# Patient Record
Sex: Male | Born: 1955 | Race: White | Hispanic: No | Marital: Married | Smoking: Never smoker
Health system: Southern US, Community
[De-identification: ages and names within clinical notes are randomized; demographics above are authoritative.]

## PROBLEM LIST (undated history)

## (undated) DIAGNOSIS — E876 Hypokalemia: Secondary | ICD-10-CM

## (undated) DIAGNOSIS — M2041 Other hammer toe(s) (acquired), right foot: Secondary | ICD-10-CM

## (undated) DIAGNOSIS — M869 Osteomyelitis, unspecified: Secondary | ICD-10-CM

## (undated) DIAGNOSIS — E785 Hyperlipidemia, unspecified: Secondary | ICD-10-CM

## (undated) DIAGNOSIS — Z89432 Acquired absence of left foot: Secondary | ICD-10-CM

## (undated) DIAGNOSIS — Q249 Congenital malformation of heart, unspecified: Secondary | ICD-10-CM

## (undated) DIAGNOSIS — E039 Hypothyroidism, unspecified: Secondary | ICD-10-CM

## (undated) DIAGNOSIS — H269 Unspecified cataract: Secondary | ICD-10-CM

## (undated) DIAGNOSIS — E119 Type 2 diabetes mellitus without complications: Secondary | ICD-10-CM

## (undated) DIAGNOSIS — R7881 Bacteremia: Secondary | ICD-10-CM

## (undated) DIAGNOSIS — Z872 Personal history of diseases of the skin and subcutaneous tissue: Secondary | ICD-10-CM

## (undated) DIAGNOSIS — I10 Essential (primary) hypertension: Secondary | ICD-10-CM

## (undated) DIAGNOSIS — M14671 Charcot's joint, right ankle and foot: Secondary | ICD-10-CM

## (undated) DIAGNOSIS — K219 Gastro-esophageal reflux disease without esophagitis: Secondary | ICD-10-CM

## (undated) HISTORY — PX: HERNIA REPAIR: SHX51

---

## 2021-01-15 ENCOUNTER — Encounter: Payer: Self-pay | Admitting: *Deleted

## 2021-01-15 ENCOUNTER — Other Ambulatory Visit: Payer: Self-pay

## 2021-01-15 ENCOUNTER — Emergency Department: Payer: PRIVATE HEALTH INSURANCE

## 2021-01-15 DIAGNOSIS — M869 Osteomyelitis, unspecified: Secondary | ICD-10-CM | POA: Diagnosis not present

## 2021-01-15 DIAGNOSIS — E039 Hypothyroidism, unspecified: Secondary | ICD-10-CM | POA: Diagnosis present

## 2021-01-15 DIAGNOSIS — E11621 Type 2 diabetes mellitus with foot ulcer: Secondary | ICD-10-CM | POA: Diagnosis present

## 2021-01-15 DIAGNOSIS — Z833 Family history of diabetes mellitus: Secondary | ICD-10-CM

## 2021-01-15 DIAGNOSIS — L97509 Non-pressure chronic ulcer of other part of unspecified foot with unspecified severity: Secondary | ICD-10-CM | POA: Diagnosis present

## 2021-01-15 DIAGNOSIS — E1142 Type 2 diabetes mellitus with diabetic polyneuropathy: Secondary | ICD-10-CM | POA: Diagnosis present

## 2021-01-15 DIAGNOSIS — I10 Essential (primary) hypertension: Secondary | ICD-10-CM | POA: Diagnosis present

## 2021-01-15 DIAGNOSIS — M009 Pyogenic arthritis, unspecified: Secondary | ICD-10-CM | POA: Diagnosis present

## 2021-01-15 DIAGNOSIS — E785 Hyperlipidemia, unspecified: Secondary | ICD-10-CM | POA: Diagnosis present

## 2021-01-15 DIAGNOSIS — E1169 Type 2 diabetes mellitus with other specified complication: Secondary | ICD-10-CM | POA: Diagnosis not present

## 2021-01-15 DIAGNOSIS — Z794 Long term (current) use of insulin: Secondary | ICD-10-CM

## 2021-01-15 DIAGNOSIS — Z20822 Contact with and (suspected) exposure to covid-19: Secondary | ICD-10-CM | POA: Diagnosis present

## 2021-01-15 LAB — BASIC METABOLIC PANEL
Anion gap: 9 (ref 5–15)
BUN: 14 mg/dL (ref 8–23)
CO2: 25 mmol/L (ref 22–32)
Calcium: 9.1 mg/dL (ref 8.9–10.3)
Chloride: 105 mmol/L (ref 98–111)
Creatinine, Ser: 0.86 mg/dL (ref 0.61–1.24)
GFR, Estimated: >60 mL/min (ref >60)
Glucose, Bld: 161 mg/dL — ABNORMAL HIGH (ref 70–99)
Potassium: 3.9 mmol/L (ref 3.5–5.1)
Sodium: 139 mmol/L (ref 135–145)

## 2021-01-15 LAB — CBC
HCT: 42.8 % (ref 39.0–52.0)
Hemoglobin: 14.7 g/dL (ref 13.0–17.0)
MCH: 30.2 pg (ref 26.0–34.0)
MCHC: 34.3 g/dL (ref 30.0–36.0)
MCV: 88.1 fL (ref 80.0–100.0)
Platelets: 241 10*3/uL (ref 150–400)
RBC: 4.86 MIL/uL (ref 4.22–5.81)
RDW: 13.1 % (ref 11.5–15.5)
WBC: 13.4 10*3/uL — ABNORMAL HIGH (ref 4.0–10.5)
nRBC: 0 % (ref 0.0–0.2)

## 2021-01-15 NOTE — ED Triage Notes (Signed)
Pt has a foot ulcer to left great toe and left 3rd toe.  Pt sent from his pmd for eval.  Pt ambulates without diff.  Denies pain.  Hx diabetes  pt alert  speech clear.

## 2021-01-16 ENCOUNTER — Inpatient Hospital Stay: Payer: PRIVATE HEALTH INSURANCE

## 2021-01-16 ENCOUNTER — Inpatient Hospital Stay
Admission: EM | Admit: 2021-01-16 | Discharge: 2021-01-17 | DRG: 638 | Disposition: A | Discharge status: Home / Self Care | Payer: PRIVATE HEALTH INSURANCE | Attending: Internal Medicine | Admitting: Internal Medicine

## 2021-01-16 ENCOUNTER — Encounter: Payer: Self-pay | Admitting: Emergency Medicine

## 2021-01-16 DIAGNOSIS — Z20822 Contact with and (suspected) exposure to covid-19: Secondary | ICD-10-CM | POA: Diagnosis present

## 2021-01-16 DIAGNOSIS — Z833 Family history of diabetes mellitus: Secondary | ICD-10-CM | POA: Diagnosis not present

## 2021-01-16 DIAGNOSIS — E1169 Type 2 diabetes mellitus with other specified complication: Secondary | ICD-10-CM | POA: Diagnosis present

## 2021-01-16 DIAGNOSIS — M869 Osteomyelitis, unspecified: Secondary | ICD-10-CM | POA: Diagnosis present

## 2021-01-16 DIAGNOSIS — I10 Essential (primary) hypertension: Secondary | ICD-10-CM

## 2021-01-16 DIAGNOSIS — E039 Hypothyroidism, unspecified: Secondary | ICD-10-CM | POA: Diagnosis present

## 2021-01-16 DIAGNOSIS — L97509 Non-pressure chronic ulcer of other part of unspecified foot with unspecified severity: Secondary | ICD-10-CM | POA: Diagnosis present

## 2021-01-16 DIAGNOSIS — M86672 Other chronic osteomyelitis, left ankle and foot: Secondary | ICD-10-CM | POA: Diagnosis not present

## 2021-01-16 DIAGNOSIS — E1142 Type 2 diabetes mellitus with diabetic polyneuropathy: Secondary | ICD-10-CM | POA: Diagnosis present

## 2021-01-16 DIAGNOSIS — E11621 Type 2 diabetes mellitus with foot ulcer: Secondary | ICD-10-CM | POA: Diagnosis present

## 2021-01-16 DIAGNOSIS — Z794 Long term (current) use of insulin: Secondary | ICD-10-CM | POA: Diagnosis not present

## 2021-01-16 DIAGNOSIS — M009 Pyogenic arthritis, unspecified: Secondary | ICD-10-CM

## 2021-01-16 DIAGNOSIS — E785 Hyperlipidemia, unspecified: Secondary | ICD-10-CM | POA: Diagnosis present

## 2021-01-16 HISTORY — DX: Unspecified cataract: H26.9

## 2021-01-16 HISTORY — DX: Type 2 diabetes mellitus without complications: E11.9

## 2021-01-16 HISTORY — DX: Essential (primary) hypertension: I10

## 2021-01-16 HISTORY — DX: Congenital malformation of heart, unspecified: Q24.9

## 2021-01-16 LAB — BASIC METABOLIC PANEL
Anion gap: 5 (ref 5–15)
BUN: 14 mg/dL (ref 8–23)
CO2: 28 mmol/L (ref 22–32)
Calcium: 8.7 mg/dL — ABNORMAL LOW (ref 8.9–10.3)
Chloride: 107 mmol/L (ref 98–111)
Creatinine, Ser: 0.82 mg/dL (ref 0.61–1.24)
GFR, Estimated: >60 mL/min (ref >60)
Glucose, Bld: 124 mg/dL — ABNORMAL HIGH (ref 70–99)
Potassium: 3.5 mmol/L (ref 3.5–5.1)
Sodium: 140 mmol/L (ref 135–145)

## 2021-01-16 LAB — RESP PANEL BY RT-PCR (FLU A&B, COVID) ARPGX2
Influenza A by PCR: NEGATIVE
Influenza B by PCR: NEGATIVE
SARS Coronavirus 2 by RT PCR: NEGATIVE

## 2021-01-16 LAB — CBC
HCT: 41.6 % (ref 39.0–52.0)
Hemoglobin: 14.1 g/dL (ref 13.0–17.0)
MCH: 30.3 pg (ref 26.0–34.0)
MCHC: 33.9 g/dL (ref 30.0–36.0)
MCV: 89.3 fL (ref 80.0–100.0)
Platelets: 206 10*3/uL (ref 150–400)
RBC: 4.66 MIL/uL (ref 4.22–5.81)
RDW: 13.2 % (ref 11.5–15.5)
WBC: 11 10*3/uL — ABNORMAL HIGH (ref 4.0–10.5)
nRBC: 0 % (ref 0.0–0.2)

## 2021-01-16 LAB — CBG MONITORING, ED
Glucose-Capillary: 116 mg/dL — ABNORMAL HIGH (ref 70–99)
Glucose-Capillary: 110 mg/dL — ABNORMAL HIGH (ref 70–99)
Glucose-Capillary: 197 mg/dL — ABNORMAL HIGH (ref 70–99)
Glucose-Capillary: 271 mg/dL — ABNORMAL HIGH (ref 70–99)

## 2021-01-16 LAB — LACTIC ACID, PLASMA
Lactic Acid, Venous: 1.7 mmol/L (ref 0.5–1.9)
Lactic Acid, Venous: 1.4 mmol/L (ref 0.5–1.9)

## 2021-01-16 LAB — TYPE AND SCREEN
ABO/RH(D): A NEG
Antibody Screen: NEGATIVE

## 2021-01-16 LAB — PROTIME-INR
INR: 1 (ref 0.8–1.2)
Prothrombin Time: 13 seconds (ref 11.4–15.2)

## 2021-01-16 LAB — SEDIMENTATION RATE: Sed Rate: 9 mm/hr (ref 0–20)

## 2021-01-16 LAB — HEMOGLOBIN A1C
Hgb A1c MFr Bld: 8.6 % — ABNORMAL HIGH (ref 4.8–5.6)
Mean Plasma Glucose: 200 mg/dL

## 2021-01-16 LAB — C-REACTIVE PROTEIN: CRP: 0.7 mg/dL (ref <1.0)

## 2021-01-16 LAB — GLUCOSE, CAPILLARY: Glucose-Capillary: 197 mg/dL — ABNORMAL HIGH (ref 70–99)

## 2021-01-16 LAB — HIV ANTIBODY (ROUTINE TESTING W REFLEX): HIV Screen 4th Generation wRfx: NONREACTIVE

## 2021-01-16 MED ORDER — VANCOMYCIN HCL 1500 MG/300ML IV SOLN: 1500.0000 mg | Freq: Two times a day (BID) | INTRAVENOUS | Status: DC

## 2021-01-16 MED ORDER — ACETAMINOPHEN 650 MG RE SUPP: 650.0000 mg | Freq: Four times a day (QID) | RECTAL | Status: DC | PRN

## 2021-01-16 MED ORDER — PIPERACILLIN-TAZOBACTAM 3.375 G IVPB 30 MIN
3.3750 g | Freq: Once | INTRAVENOUS | Status: AC
  Administered 2021-01-16: 3.375 g via INTRAVENOUS
  Filled 2021-01-16: qty 50

## 2021-01-16 MED ORDER — VANCOMYCIN HCL 2000 MG/400ML IV SOLN
2000.0000 mg | Freq: Once | INTRAVENOUS | Status: AC
  Administered 2021-01-16: 2000 mg via INTRAVENOUS
  Filled 2021-01-16: qty 400

## 2021-01-16 MED ORDER — VANCOMYCIN HCL IN DEXTROSE 1-5 GM/200ML-% IV SOLN
1000.0000 mg | Freq: Once | INTRAVENOUS | Status: DC
  Filled 2021-01-16: qty 200

## 2021-01-16 MED ORDER — HYDRALAZINE HCL 20 MG/ML IJ SOLN
10.0000 mg | Freq: Four times a day (QID) | INTRAMUSCULAR | Status: DC | PRN
  Administered 2021-01-16: 10 mg via INTRAVENOUS
  Filled 2021-01-16: qty 1

## 2021-01-16 MED ORDER — SODIUM CHLORIDE 0.9 % IV SOLN
2.0000 g | Freq: Three times a day (TID) | INTRAVENOUS | Status: DC
  Administered 2021-01-16 – 2021-01-17 (×4): 2 g via INTRAVENOUS
  Filled 2021-01-16 (×8): qty 2

## 2021-01-16 MED ORDER — MORPHINE SULFATE (PF) 2 MG/ML IV SOLN: 2.0000 mg | INTRAVENOUS | Status: DC | PRN

## 2021-01-16 MED ORDER — SODIUM CHLORIDE 0.9 % IV SOLN: 1.0000 g | Freq: Once | INTRAVENOUS | Status: DC

## 2021-01-16 MED ORDER — TRAZODONE HCL 50 MG PO TABS: 25.0000 mg | ORAL_TABLET | Freq: Every evening | ORAL | Status: DC | PRN

## 2021-01-16 MED ORDER — SODIUM CHLORIDE 0.9 % IV SOLN
1.0000 g | Freq: Three times a day (TID) | INTRAVENOUS | Status: DC
  Administered 2021-01-16: 1 g via INTRAVENOUS
  Filled 2021-01-16: qty 1

## 2021-01-16 MED ORDER — ACETAMINOPHEN 325 MG PO TABS
650.0000 mg | ORAL_TABLET | Freq: Four times a day (QID) | ORAL | Status: DC | PRN
  Administered 2021-01-17: 650 mg via ORAL
  Filled 2021-01-16: qty 2

## 2021-01-16 MED ORDER — ENOXAPARIN SODIUM 40 MG/0.4ML IJ SOSY
40.0000 mg | PREFILLED_SYRINGE | INTRAMUSCULAR | Status: DC
  Administered 2021-01-16 – 2021-01-17 (×2): 40 mg via SUBCUTANEOUS
  Filled 2021-01-16 (×2): qty 0.4

## 2021-01-16 MED ORDER — ONDANSETRON HCL 4 MG/2ML IJ SOLN: 4.0000 mg | Freq: Four times a day (QID) | INTRAMUSCULAR | Status: DC | PRN

## 2021-01-16 MED ORDER — ONDANSETRON HCL 4 MG PO TABS: 4.0000 mg | ORAL_TABLET | Freq: Four times a day (QID) | ORAL | Status: DC | PRN

## 2021-01-16 MED ORDER — VANCOMYCIN HCL 1250 MG/250ML IV SOLN
1250.0000 mg | Freq: Two times a day (BID) | INTRAVENOUS | Status: DC
  Administered 2021-01-16 – 2021-01-17 (×2): 1250 mg via INTRAVENOUS
  Filled 2021-01-16 (×4): qty 250

## 2021-01-16 MED ORDER — INSULIN ASPART 100 UNIT/ML IJ SOLN: 0.0000 [IU] | Freq: Every day | INTRAMUSCULAR | Status: DC

## 2021-01-16 MED ORDER — VANCOMYCIN HCL IN DEXTROSE 1-5 GM/200ML-% IV SOLN: 1000.0000 mg | Freq: Once | INTRAVENOUS | Status: DC

## 2021-01-16 MED ORDER — INSULIN ASPART 100 UNIT/ML IJ SOLN
0.0000 [IU] | Freq: Three times a day (TID) | INTRAMUSCULAR | Status: DC
Start: 2021-01-16 — End: 2021-01-17
  Administered 2021-01-16: 3 [IU] via SUBCUTANEOUS
  Administered 2021-01-16: 8 [IU] via SUBCUTANEOUS
  Administered 2021-01-17: 3 [IU] via SUBCUTANEOUS
  Administered 2021-01-17: 2 [IU] via SUBCUTANEOUS
  Filled 2021-01-16 (×4): qty 1

## 2021-01-16 MED ORDER — SODIUM CHLORIDE 0.9 % IV SOLN
INTRAVENOUS | Status: DC
  Administered 2021-01-16: 100 mL/h via INTRAVENOUS

## 2021-01-16 NOTE — ED Notes (Signed)
Pt eating lunch in hall. No further needs expressed.

## 2021-01-16 NOTE — Progress Notes (Addendum)
Pharmacy Antibiotic Note  Alexander Gill is a 65 y.o. male admitted on 01/16/2021 with cellulitis w/ foot ulcers.  Imagine concerning for septic arthritis and/or osteomyelitis.    Pharmacy has been consulted for Vancomycin and Cefepime dosing.  Plan: Will stop meropenem and start:  Cefepime 2g q8h  Vancomcyin Pt given 2 gm dose in ED.  Vancomycin 1250 mg IV Q 12 hrs.  Goal AUC 400-550. Expected AUC: 464.3 SCr used: 0.86  Pharmacy will continue to follow and adjust abx dosing whenever warranted.  Height: 6' (182.9 cm) Weight: 88.5 kg (195 lb) IBW/kg (Calculated) : 77.6  Temp (24hrs), Avg:98.4 F (36.9 C), Min:98.4 F (36.9 C), Max:98.4 F (36.9 C)  Recent Labs  Lab 01/15/21 1929 01/16/21 0120 01/16/21 0139 01/16/21 0234  WBC 13.4*  --  11.0*  --   CREATININE 0.86  --  0.82  --   LATICACIDVEN  --  1.7  --  1.4     Estimated Creatinine Clearance: 101.2 mL/min (by C-G formula based on SCr of 0.82 mg/dL).    No Known Allergies  Antimicrobials this admission: 06/30 Zosyn >> x 1 06/30 Vancomycin >>  06/30 Meropenem x1 06/30 Cefepime >>  Microbiology results: 06/30 BCx: Pending  Thank you for allowing pharmacy to be a part of this patient's care.  Albina Billet, PharmD, BCPS Clinical Pharmacist 01/16/2021 8:23 AM

## 2021-01-16 NOTE — H&P (Signed)
[place]   PATIENT NAME: Alexander Gill    MR#:  [id number]  DATE OF BIRTH:  01-22-1956  DATE OF ADMISSION:  01/16/2021  PRIMARY CARE PHYSICIAN: Pcp, No   Patient is coming from: Home  REQUESTING/REFERRING PHYSICIAN: Ward, Layla Maw, DO  CHIEF COMPLAINT:   Chief Complaint  Patient presents with  . Foot Ulcer    HISTORY OF PRESENT ILLNESS:  Alexander Gill is a 65 y.o. Caucasian male with medical history significant for type 2 diabetes mellitus, who presented to the emergency room with acute onset of left big toe and left third toe wounds that initially started with a blister 3 months ago on the left great toe and has been progressively worsening.  His left third toe wound.  About a month ago.  Has peripheral neuropathy with diabetes mellitus.  He denies any fever or chills.  He has been swelling and breath without pain secondary to his peripheral neuropathy.  No chest pain or dyspnea or cough or wheezing.  No nausea or vomiting or abdominal pain.  He denied any headache or dizziness or blurred vision.  No dysuria, oliguria or hematuria or flank pain.  He saw his primary care physician last Wednesday and was given a prescription for Augmentin and Bactrim.  He was given an appointment for x-ray yesterday morning.  After receiving results of the x-ray is primary care physician advised him to come to the ER.  ED Course: Upon presenting to the emergency room, blood pressure was 173/124 with a heart rate of 58 with otherwise normal vital signs.  Labs revealed Blood glucose of 161 with otherwise unremarkable BMP lactic acid was 1.7 and CBC showed leukocytosis of 13.4.  Lactic acid was 1.4 later.  2 blood cultures were drawn.  Influenza antigens and COVID-19 PCR came back negative.  Imaging: Left foot x-ray showed the following: Soft tissue ulceration the medial first digit at the level of the interphalangeal joint. Destructive changes and subcortical osteopenia of the proximal middle  distal phalanges with subluxation across the joint concerning for osteomyelitis and septic arthritis.   Additional ulceration involving the tip of the third digit with questionable focus of soft tissue gas. Subjacent transcortical lucency and osteopenia the third distal phalanx concerning for additional site of osteomyelitis.   Further characterization could be made with MR imaging as clinically warranted.  The patient was given IV vancomycin and Zosyn.  He will be admitted to a medical bed for further evaluation and management. PAST MEDICAL HISTORY:   Past Medical History:  Diagnosis Date  . Diabetes mellitus without complication (HCC)     PAST SURGICAL HISTORY:  History reviewed. No pertinent surgical history.  He denies any previous surgeries.  SOCIAL HISTORY:   Social History   Tobacco Use  . Smoking status: Never  . Smokeless tobacco: Not on file  Substance Use Topics  . Alcohol use: Not Currently    FAMILY HISTORY:   Positive for diabetes mellitus in his father.  DRUG ALLERGIES:  No Known Allergies  REVIEW OF SYSTEMS:   ROS As per history of present illness. All pertinent systems were reviewed above. Constitutional, HEENT, cardiovascular, respiratory, GI, GU, musculoskeletal, neuro, psychiatric, endocrine, integumentary and hematologic systems were reviewed and are otherwise negative/unremarkable except for positive findings mentioned above in the HPI.   MEDICATIONS AT HOME:   Prior to Admission medications   Not on File      VITAL SIGNS:  Blood pressure (!) 198/85, pulse (!) 53,  temperature 98.4 F (36.9 C), temperature source Oral, resp. rate 18, height 6' (1.829 m), weight 88.5 kg, SpO2 99 %.  PHYSICAL EXAMINATION:  Physical Exam  GENERAL:  65 y.o.-year-old Caucasian male patient lying in the bed with no acute distress.  EYES: Pupils equal, round, reactive to light and accommodation. No scleral icterus. Extraocular muscles intact.  HEENT: Head  atraumatic, normocephalic. Oropharynx and nasopharynx clear.  NECK:  Supple, no jugular venous distention. No thyroid enlargement, no tenderness.  LUNGS: Normal breath sounds bilaterally, no wheezing, rales,rhonchi or crepitation. No use of accessory muscles of respiration.  CARDIOVASCULAR: Regular rate and rhythm, S1, S2 normal. No murmurs, rubs, or gallops.  ABDOMEN: Soft, nondistended, nontender. Bowel sounds present. No organomegaly or mass.  EXTREMITIES: No pedal edema, cyanosis, or clubbing.  NEUROLOGIC: Cranial nerves II through XII are intact. Muscle strength 5/5 in all extremities. Sensation intact. Gait not checked.  PSYCHIATRIC: The patient is alert and oriented x 3.  Normal affect and good eye contact. SKIN: Left big toe and third toe scabbed ulcer with diffuse swelling and honey crusting.  Right plantar first metatarsal ulcer.  The patient had no tenderness.      LABORATORY PANEL:   CBC Recent Labs  Lab 01/16/21 0139  WBC 11.0*  HGB 14.1  HCT 41.6  PLT 206   ------------------------------------------------------------------------------------------------------------------  Chemistries  Recent Labs  Lab 01/16/21 0139  NA 140  K 3.5  CL 107  CO2 28  GLUCOSE 124*  BUN 14  CREATININE 0.82  CALCIUM 8.7*   ------------------------------------------------------------------------------------------------------------------  Cardiac Enzymes No results for input(s): TROPONINI in the last 168 hours. ------------------------------------------------------------------------------------------------------------------  RADIOLOGY:  DG Foot Complete Left  Result Date: 01/15/2021 CLINICAL DATA:  Foot ulceration, ulcers of the left great toe and third digit EXAM: LEFT FOOT - COMPLETE 3+ VIEW COMPARISON:  None. FINDINGS: Diffuse soft tissue swelling of the forefoot. More focal soft tissue ulceration is noted along the medial aspect of the first digit near the interphalangeal  joint. There is subjacent subcortical osteopenia and destructive changes with partial lateral subluxation across the first interphalangeal joint concerning for features of septic arthritis and osteomyelitis. Additional ulceration seen involving the tip of the third digit small focus of soft tissue gas and subjacent transcortical lucency and subcortical osteopenia suggesting additional site of infection. Additional clawtoe deformities of the second through fifth rays. Mild degenerative changes in the midfoot. Slightly decreased calcaneal inclination angle could reflect a pes planus deformity though incompletely assessed on nonweightbearing exam. Vascular calcium the soft tissues. No other acute or conspicuous osseous abnormality IMPRESSION: Soft tissue ulceration the medial first digit at the level of the interphalangeal joint. Destructive changes and subcortical osteopenia of the proximal middle distal phalanges with subluxation across the joint concerning for osteomyelitis and septic arthritis. Additional ulceration involving the tip of the third digit with questionable focus of soft tissue gas. Subjacent transcortical lucency and osteopenia the third distal phalanx concerning for additional site of osteomyelitis. Further characterization could be made with MR imaging as clinically warranted. Electronically Signed   By: Kreg Shropshire M.D.   On: 01/15/2021 23:43      IMPRESSION AND PLAN:  Active Problems:   Toe osteomyelitis (HCC)  1.  Left big toe and possibly third toe osteomyelitis with associated big toe septic arthritis initially joint of the big toe. - The patient will be admitted to a medical bed. - We will continue antibiotic therapy with IV Zosyn and vancomycin. - The patient will be hydrated with IV  normal saline. - Pain management to be provided as needed. - Podiatry consult will be obtained. - I notified Dr. Lilian Kapur about the patient.  2.  Essential hypertension. - We will continue  atenolol, ramipril and Hyzaar.  3.  Type 2 diabetes mellitus with peripheral neuropathy. - The patient will be placed on supplement coverage with NovoLog I will continue his basal coverage. - We will continue her oral antidiabetics.  4.  Dyslipidemia. - We will continue statin therapy.  5.  Hypothyroidism. - We will continue Synthroid.  Back  DVT prophylaxis: Lovenox. Code Status: full code. Family Communication:  The plan of care was discussed in details with the patient (and family). I answered all questions. The patient agreed to proceed with the above mentioned plan. Further management will depend upon hospital course. Disposition Plan: Back to previous home environment Consults called: Podiatry. All the records are reviewed and case discussed with ED provider.  Status is: Inpatient  Remains inpatient appropriate because:Ongoing diagnostic testing needed not appropriate for outpatient work up, Unsafe d/c plan, IV treatments appropriate due to intensity of illness or inability to take PO, and Inpatient level of care appropriate due to severity of illness  Dispo: The patient is from: Home              Anticipated d/c is to: Home              Patient currently is not medically stable to d/c.   Difficult to place patient No   TOTAL TIME TAKING CARE OF THIS PATIENT: 55 minutes.    Hannah Beat M.D on 01/16/2021 at 2:36 AM  [facility]   From 7 PM-7 AM, contact night-coverage [email or web address]  CC: Primary care physician; Pcp, No

## 2021-01-16 NOTE — H&P (View-Only) (Signed)
  Subjective:  Patient ID: Alexander Gill, male    DOB: 01/04/1956,  MRN: [id number]  A 64 y.o. male presents with past medical history of type 2 diabetes to the left big toe blister as well as third digit ulceration.  Patient states it was primarily been managed [place] in [place] however has recently moved down here and it seems to be getting progressively worse.  Patient states that he saw his primary care physician and was given a prescription for Augmentin and Bactrim and had x-rays done and come to the ER for osteomyelitis.  I was consulted to evaluate the osteomyelitis for possible amputation.  He also mentioned that he had a history of being worked up for vascular with a possible [place].  He denies any other acute complaints no nausea fever chills vomiting.   Objective:   Vitals:   01/16/21 0455 01/16/21 0530  BP: (!) 185/81 (!) 154/83  Pulse: (!) 48 83  Resp: 18 18  Temp:    SpO2: 97% 100%   General AA&O x3. Normal mood and affect.  Vascular Dorsalis pedis palpable 2 out of 4 bilaterally and posterior tibial nonpalpable 2 out of 4 bilaterally Brisk capillary refill to all digits. Pedal hair present.  Neurologic Epicritic sensation grossly intact.  Dermatologic Stable ulceration noted to the left medial aspect of the hallux as well as left distal aspect of the third digit.  Probing down to deep tissue.  No purulent drainage noted.  Mild erythema noted.  No malodor present.  Orthopedic: MMT 5/5 in dorsiflexion, plantarflexion, inversion, and eversion. Normal joint ROM without pain or crepitus.      Radiographs: Cortical destruction noted at the IPJ joint of the hallux.  No other area of cortical destruction noted including the third digit.  Soft tissue defect noted correlating with the osteomyelitis of the hallux.  No soft tissue gas noted no other bony abnormalities identified. Assessment & Plan:  Patient was evaluated and treated and all questions  answered.  Left hallux and third digit ulceration probing down to deep tissue with concern for underlying osteomyelitis -All questions and concerns were addressed and discussed with the patient in great detail -He will benefit from an MRI evaluation to assess the osteomyelitis especially to the third digit. -Given that he has a history of vascular work-up being done up north in [place] I believe he will benefit from a minimum ABIs PVRs and a possible vascular consultation based off of that. -Clinically at this time patient can benefit from Betadine wet-to-dry dressing changes to be changed once a day. -Weightbearing as tolerated in a surgical shoe -He may need some kind of the OR intervention after vascular work-up and advanced imaging.   Kashmir Leedy P Inez Stantz, DPM  Accessible via secure chat for questions or concerns.  

## 2021-01-16 NOTE — ED Notes (Signed)
Podiatrist at bedside evaluating pt. Dr Georgeann Oppenheim at bedside with pt.

## 2021-01-16 NOTE — ED Notes (Signed)
CBG checked and found to be 110.  Messaged attending to ask if would want to put regular CBG check orders as pt is diabetic.

## 2021-01-16 NOTE — ED Notes (Signed)
Patient transported to MRI 

## 2021-01-16 NOTE — Consult Note (Signed)
  Subjective:  Patient ID: Alexander Gill, male    DOB: 14-Jun-1956,  MRN: [id number]  A 65 y.o. male presents with past medical history of type 2 diabetes to the left big toe blister as well as third digit ulceration.  Patient states it was primarily been managed up [place] in [place] however has recently moved down here and it seems to be getting progressively worse.  Patient states that he saw his primary care physician and was given a prescription for Augmentin and Bactrim and had x-rays done and come to the ER for osteomyelitis.  I was consulted to evaluate the osteomyelitis for possible amputation.  He also mentioned that he had a history of being worked up for vascular with a possible intervention up [place].  He denies any other acute complaints no nausea fever chills vomiting.   Objective:   Vitals:   01/16/21 0455 01/16/21 0530  BP: (!) 185/81 (!) 154/83  Pulse: (!) 48 83  Resp: 18 18  Temp:    SpO2: 97% 100%   General AA&O x3. Normal mood and affect.  Vascular Dorsalis pedis palpable 2 out of 4 bilaterally and posterior tibial nonpalpable 2 out of 4 bilaterally Brisk capillary refill to all digits. Pedal hair present.  Neurologic Epicritic sensation grossly intact.  Dermatologic Stable ulceration noted to the left medial aspect of the hallux as well as left distal aspect of the third digit.  Probing down to deep tissue.  No purulent drainage noted.  Mild erythema noted.  No malodor present.  Orthopedic: MMT 5/5 in dorsiflexion, plantarflexion, inversion, and eversion. Normal joint ROM without pain or crepitus.      Radiographs: Cortical destruction noted at the IPJ joint of the hallux.  No other area of cortical destruction noted including the third digit.  Soft tissue defect noted correlating with the osteomyelitis of the hallux.  No soft tissue gas noted no other bony abnormalities identified. Assessment & Plan:  Patient was evaluated and treated and all questions  answered.  Left hallux and third digit ulceration probing down to deep tissue with concern for underlying osteomyelitis -All questions and concerns were addressed and discussed with the patient in great detail -He will benefit from an MRI evaluation to assess the osteomyelitis especially to the third digit. -Given that he has a history of vascular work-up being done up north in [place] I believe he will benefit from a minimum ABIs PVRs and a possible vascular consultation based off of that. -Clinically at this time patient can benefit from Betadine wet-to-dry dressing changes to be changed once a day. -Weightbearing as tolerated in a surgical shoe -He may need some kind of the OR intervention after vascular work-up and advanced imaging.   Candelaria Stagers, DPM  Accessible via secure chat for questions or concerns.

## 2021-01-16 NOTE — Progress Notes (Signed)
Brief hospitalist update note.  This is a nonbillable note.  Please see same-day H&P for full billable details.  Briefly, this is a 65 year old Caucasian male with history significant for type 2 diabetes mellitus presented to the ED with acute onset of left hallux and left third toe wounds that began with a blister 3 months ago and have been progressively worsening.  Patient has a history of diabetic foot infections on the contralateral foot.  He has a history of peripheral neuropathy with diabetes.  He is nonseptic appearing.  He previously presented to his primary care physician and was given a prescription for Augmentin and Bactrim and had an x-ray done 1 day prior to presentation.  After receiving results of x-ray primary care physician advised him to present to the emergency room.       I saw and evaluated the patient in conjunction with podiatry consult.  At time of this notes we will proceed with further diagnostic investigation including MRI of the affected extremity and ABIs.  Depending on results of that may need vascular surgery involvement.  Further management recommendations to follow pending results of above studies.  Lolita Patella MD

## 2021-01-16 NOTE — ED Provider Notes (Signed)
[facility] Emergency Department Provider Note  ____________________________________________   Event Date/Time   First MD Initiated Contact with Patient 01/16/21 0034     (approximate)  I have reviewed the triage vital signs and the nursing notes.   HISTORY  Chief Complaint Foot Ulcer    HPI Alexander Gill is a 65 y.o. male with history of diabetes who presents to the emergency department with a wound to the left great toe and left third toe.  States the wound on his left great toe started off as a blister about 3 months ago and has progressively worsened.  States about a month ago he noticed a wound to the left third toe.  He has neuropathy so denies having any significant pain.  States he just got insurance and went to her primary care doctor today who ordered an x-ray and then instructed him to come to the emergency department.  He denies fevers, vomiting.  He has been ambulatory.  No drainage.  Reports he has previously had osteomyelitis of the right foot that resolved with antibiotics and he did not require surgical intervention.        Past Medical History:  Diagnosis Date   Diabetes mellitus without complication (HCC)     There are no problems to display for this patient.   History reviewed. No pertinent surgical history.  Prior to Admission medications   Not on File    Allergies Patient has no known allergies.  History reviewed. No pertinent family history.  Social History Social History   Tobacco Use   Smoking status: Never  Substance Use Topics   Alcohol use: Not Currently   Drug use: Not Currently    Review of Systems Constitutional: No fever. Eyes: No visual changes. ENT: No sore throat. Cardiovascular: Denies chest pain. Respiratory: Denies shortness of breath. Gastrointestinal: No nausea, vomiting, diarrhea. Genitourinary: Negative for dysuria. Musculoskeletal: Negative for back pain. Skin: Negative for  rash. Neurological: Negative for focal weakness or numbness.  ____________________________________________   PHYSICAL EXAM:  VITAL SIGNS: ED Triage Vitals  Enc Vitals Group     BP 01/15/21 1941 (!) 173/124     Pulse Rate 01/15/21 1921 (!) 58     Resp 01/15/21 1921 20     Temp 01/15/21 1921 98.4 F (36.9 C)     Temp Source 01/15/21 1921 Oral     SpO2 01/15/21 1921 98 %     Weight 01/15/21 1918 195 lb (88.5 kg)     Height 01/15/21 1918 6' (1.829 m)     Head Circumference --      Peak Flow --      Pain Score 01/15/21 1918 0     Pain Loc --      Pain Edu? --      Excl. in GC? --    CONSTITUTIONAL: Alert and oriented and responds appropriately to questions. Well-appearing; well-nourished HEAD: Normocephalic EYES: Conjunctivae clear, pupils appear equal, EOM appear intact ENT: normal nose; moist mucous membranes NECK: Supple, normal ROM CARD: RRR; S1 and S2 appreciated; no murmurs, no clicks, no rubs, no gallops RESP: Normal chest excursion without splinting or tachypnea; breath sounds clear and equal bilaterally; no wheezes, no rhonchi, no rales, no hypoxia or respiratory distress, speaking full sentences ABD/GI: Normal bowel sounds; non-distended; soft, non-tender, no rebound, no guarding, no peritoneal signs, no hepatosplenomegaly BACK: The back appears normal EXT: Ulcers noted to the left great toe and third toe.  No drainage, bleeding or foul odor.  Minimal surrounding redness and warmth.  2+ DP pulses bilaterally.  No calf tenderness or calf swelling.  Compartments soft.  Diminished sensation in bilateral distal lower extremities which he reports is chronic. SKIN: Normal color for age and race; warm; no rash on exposed skin NEURO: Moves all extremities equally PSYCH: The patient's mood and manner are appropriate.       Patient gave verbal permission to utilize photo for medical documentation only. The image was not stored on any personal  device.  ____________________________________________   LABS (all labs ordered are listed, but only abnormal results are displayed)  Labs Reviewed  BASIC METABOLIC PANEL - Abnormal; Notable for the following components:      Result Value   Glucose, Bld 161 (*)    All other components within normal limits  CBC - Abnormal; Notable for the following components:   WBC 13.4 (*)    All other components within normal limits  CULTURE, BLOOD (ROUTINE X 2)  CULTURE, BLOOD (ROUTINE X 2)  RESP PANEL BY RT-PCR (FLU A&B, COVID) ARPGX2  LACTIC ACID, PLASMA  LACTIC ACID, PLASMA  SEDIMENTATION RATE  C-REACTIVE PROTEIN  PROTIME-INR  TYPE AND SCREEN   ____________________________________________  EKG   ____________________________________________  RADIOLOGY I, Ethelwyn Gilbertson, personally viewed and evaluated these images (plain radiographs) as part of my medical decision making, as well as reviewing the written report by the radiologist.  ED MD interpretation: X-ray concerning for osteomyelitis, possible septic arthritis at the level of the interphalangeal joint of the first toe.  Possible soft tissue gas and ulceration at the tip of the third toe with concerns for osteomyelitis there as well.  Official radiology report(s): DG Foot Complete Left  Result Date: 01/15/2021 CLINICAL DATA:  Foot ulceration, ulcers of the left great toe and third digit EXAM: LEFT FOOT - COMPLETE 3+ VIEW COMPARISON:  None. FINDINGS: Diffuse soft tissue swelling of the forefoot. More focal soft tissue ulceration is noted along the medial aspect of the first digit near the interphalangeal joint. There is subjacent subcortical osteopenia and destructive changes with partial lateral subluxation across the first interphalangeal joint concerning for features of septic arthritis and osteomyelitis. Additional ulceration seen involving the tip of the third digit small focus of soft tissue gas and subjacent transcortical lucency  and subcortical osteopenia suggesting additional site of infection. Additional clawtoe deformities of the second through fifth rays. Mild degenerative changes in the midfoot. Slightly decreased calcaneal inclination angle could reflect a pes planus deformity though incompletely assessed on nonweightbearing exam. Vascular calcium the soft tissues. No other acute or conspicuous osseous abnormality IMPRESSION: Soft tissue ulceration the medial first digit at the level of the interphalangeal joint. Destructive changes and subcortical osteopenia of the proximal middle distal phalanges with subluxation across the joint concerning for osteomyelitis and septic arthritis. Additional ulceration involving the tip of the third digit with questionable focus of soft tissue gas. Subjacent transcortical lucency and osteopenia the third distal phalanx concerning for additional site of osteomyelitis. Further characterization could be made with MR imaging as clinically warranted. Electronically Signed   By: Kreg Shropshire M.D.   On: 01/15/2021 23:43    ____________________________________________   PROCEDURES  Procedure(s) performed (including Critical Care):  Procedures  CRITICAL CARE Performed by: Baxter Hire Lavora Brisbon   Total critical care time: 45 minutes  Critical care time was exclusive of separately billable procedures and treating other patients.  Critical care was necessary to treat or prevent imminent or life-threatening deterioration.  Critical care was time spent  personally by me on the following activities: development of treatment plan with patient and/or surrogate as well as nursing, discussions with consultants, evaluation of patient's response to treatment, examination of patient, obtaining history from patient or surrogate, ordering and performing treatments and interventions, ordering and review of laboratory studies, ordering and review of radiographic studies, pulse oximetry and re-evaluation of  patient's condition.  ____________________________________________   INITIAL IMPRESSION / ASSESSMENT AND PLAN / ED COURSE  As part of my medical decision making, I reviewed the following data within the electronic MEDICAL RECORD NUMBER Nursing notes reviewed and incorporated, Labs reviewed , Old chart reviewed, Radiograph reviewed , Discussed with admitting physician , A consult was requested and obtained from this/these consultant(s) Orthopedics, and Notes from prior ED visits         Patient here with ulcerations to the left great toe and third toe with signs concerning for osteomyelitis and possible septic arthritis seen on x-ray.  No systemic symptoms.  He is a diabetic.  Will discuss with orthopedics on-call and discussed with medicine for admission.  Will give broad-spectrum antibiotics.  ED PROGRESS  1:04 AM  Spoke with Dr. Okey Dupre with orthopedics who states this would be managed by podiatry.  1:12 AM Discussed patient's case with hospitalist, Dr. Arville Care.  I have recommended admission and patient (and family if present) agree with this plan. Admitting physician will place admission orders.   I reviewed all nursing notes, vitals, pertinent previous records and reviewed/interpreted all EKGs, lab and urine results, imaging (as available).  3:30 AM  Secretary has attempted to get in tough with Dr. Lilian Kapur on call for podiatry several times without success.  Hospitalist updated.  ____________________________________________   FINAL CLINICAL IMPRESSION(S) / ED DIAGNOSES  Final diagnoses:  Osteomyelitis of great toe of left foot (HCC)  Osteomyelitis of third toe of left foot (HCC)  Septic arthritis of interphalangeal joint of toe, left [facility])     ED Discharge Orders     None       *Please note:  Burnie Therien was evaluated in Emergency Department on 01/16/2021 for the symptoms described in the history of present illness. He was evaluated in the context of the global COVID-19  pandemic, which necessitated consideration that the patient might be at risk for infection with the SARS-CoV-2 virus that causes COVID-19. Institutional protocols and algorithms that pertain to the evaluation of patients at risk for COVID-19 are in a state of rapid change based on information released by regulatory bodies including the CDC and federal and state organizations. These policies and algorithms were followed during the patient's care in the ED.  Some ED evaluations and interventions may be delayed as a result of limited staffing during and the pandemic.*   Note:  This document was prepared using Dragon voice recognition software and may include unintentional dictation errors.    Samiah Ricklefs, Layla Maw, DO 01/16/21 [phone]

## 2021-01-16 NOTE — Progress Notes (Signed)
Pharmacy Antibiotic Note  Alexander Gill is a 65 y.o. male admitted on 01/16/2021 with cellulitis w/ foot ulcers.  Pharmacy has been consulted for Vancomycin and Meropenem dosing.  Plan: Ordered Meropenem 1 gm q8h per indication and renal fxn.  Vancomcyin Pt given 2 gm dose in ED.  Vancomycin 1250 mg IV Q 12 hrs.  Goal AUC 400-550. Expected AUC: 464.3 SCr used: 0.86  Pharmacy will continue to follow and adjust abx dosing whenever warranted.  Height: 6' (182.9 cm) Weight: 88.5 kg (195 lb) IBW/kg (Calculated) : 77.6  Temp (24hrs), Avg:98.4 F (36.9 C), Min:98.4 F (36.9 C), Max:98.4 F (36.9 C)  Recent Labs  Lab 01/15/21 1929  WBC 13.4*  CREATININE 0.86    Estimated Creatinine Clearance: 96.5 mL/min (by C-G formula based on SCr of 0.86 mg/dL).    No Known Allergies  Antimicrobials this admission: 06/30 Zosyn >> x 1 06/30 Vancomycin >>  06/30 Meropenem >>  Microbiology results: 06/30 BCx: Pending  Thank you for allowing pharmacy to be a part of this patient's care.  Otelia Sergeant, PharmD, MBA 01/16/2021 2:10 AM

## 2021-01-17 ENCOUNTER — Encounter
Admission: EM | Disposition: A | Discharge status: Home / Self Care | Payer: Self-pay | Source: Home / Self Care | Attending: Internal Medicine

## 2021-01-17 ENCOUNTER — Encounter: Payer: Self-pay | Admitting: Anesthesiology

## 2021-01-17 DIAGNOSIS — M86672 Other chronic osteomyelitis, left ankle and foot: Secondary | ICD-10-CM

## 2021-01-17 LAB — CREATININE, SERUM
Creatinine, Ser: 0.75 mg/dL (ref 0.61–1.24)
GFR, Estimated: >60 mL/min (ref >60)

## 2021-01-17 LAB — GLUCOSE, CAPILLARY
Glucose-Capillary: 171 mg/dL — ABNORMAL HIGH (ref 70–99)
Glucose-Capillary: 133 mg/dL — ABNORMAL HIGH (ref 70–99)
Glucose-Capillary: 112 mg/dL — ABNORMAL HIGH (ref 70–99)

## 2021-01-17 SURGERY — AMPUTATION, TOE
Anesthesia: Choice

## 2021-01-17 MED ORDER — REPAGLINIDE 1 MG PO TABS
1.0000 mg | ORAL_TABLET | Freq: Every day | ORAL | Status: DC
  Administered 2021-01-17: 1 mg via ORAL
  Filled 2021-01-17: qty 1

## 2021-01-17 MED ORDER — VANCOMYCIN HCL 1000 MG/200ML IV SOLN
1000.0000 mg | Freq: Three times a day (TID) | INTRAVENOUS | Status: DC
  Filled 2021-01-17 (×2): qty 200

## 2021-01-17 MED ORDER — ASPIRIN EC 81 MG PO TBEC
81.0000 mg | DELAYED_RELEASE_TABLET | Freq: Every day | ORAL | Status: DC
  Administered 2021-01-17: 81 mg via ORAL
  Filled 2021-01-17: qty 1

## 2021-01-17 MED ORDER — PROPOFOL 10 MG/ML IV BOLUS
INTRAVENOUS | Status: AC
  Filled 2021-01-17: qty 20

## 2021-01-17 MED ORDER — DOXYCYCLINE HYCLATE 100 MG PO TBEC: 100.0000 mg | DELAYED_RELEASE_TABLET | Freq: Two times a day (BID) | ORAL | 0 refills | Status: AC

## 2021-01-17 MED ORDER — INSULIN GLARGINE 100 UNIT/ML ~~LOC~~ SOLN
5.0000 [IU] | Freq: Every day | SUBCUTANEOUS | Status: DC
  Administered 2021-01-17: 5 [IU] via SUBCUTANEOUS
  Filled 2021-01-17 (×2): qty 0.05

## 2021-01-17 MED ORDER — ATENOLOL 25 MG PO TABS
25.0000 mg | ORAL_TABLET | Freq: Every day | ORAL | Status: DC
  Administered 2021-01-17: 25 mg via ORAL
  Filled 2021-01-17: qty 1

## 2021-01-17 MED ORDER — LEVOTHYROXINE SODIUM 50 MCG PO TABS: 25.0000 ug | ORAL_TABLET | Freq: Every day | ORAL | Status: DC

## 2021-01-17 MED ORDER — VANCOMYCIN HCL IN DEXTROSE 1-5 GM/200ML-% IV SOLN
1000.0000 mg | Freq: Three times a day (TID) | INTRAVENOUS | Status: DC
  Filled 2021-01-17 (×4): qty 200

## 2021-01-17 MED ORDER — VITAMIN D 25 MCG (1000 UNIT) PO TABS
1000.0000 [IU] | ORAL_TABLET | Freq: Every day | ORAL | Status: DC
  Administered 2021-01-17: 1000 [IU] via ORAL
  Filled 2021-01-17: qty 1

## 2021-01-17 MED ORDER — RAMIPRIL 10 MG PO CAPS
10.0000 mg | ORAL_CAPSULE | Freq: Every day | ORAL | Status: DC
  Administered 2021-01-17: 10 mg via ORAL
  Filled 2021-01-17: qty 1

## 2021-01-17 MED ORDER — ROSUVASTATIN CALCIUM 10 MG PO TABS
20.0000 mg | ORAL_TABLET | Freq: Every day | ORAL | Status: DC
  Administered 2021-01-17: 20 mg via ORAL
  Filled 2021-01-17: qty 2

## 2021-01-17 SURGICAL SUPPLY — 45 items
BLADE OSC/SAGITTAL MD 5.5X18 (BLADE) ×4 IMPLANT
BLADE SURG MINI STRL (BLADE) ×4 IMPLANT
BNDG CONFORM 2 STRL LF (GAUZE/BANDAGES/DRESSINGS) ×4 IMPLANT
BNDG CONFORM 3 STRL LF (GAUZE/BANDAGES/DRESSINGS) ×8 IMPLANT
BNDG ELASTIC 4X5.8 VLCR NS LF (GAUZE/BANDAGES/DRESSINGS) ×4 IMPLANT
BNDG ESMARK 4X12 TAN STRL LF (GAUZE/BANDAGES/DRESSINGS) ×4 IMPLANT
BNDG GAUZE ELAST 4 BULKY (GAUZE/BANDAGES/DRESSINGS) ×4 IMPLANT
CANISTER SUCT 1200ML W/VALVE (MISCELLANEOUS) ×4 IMPLANT
CUFF TOURN SGL QUICK 12 (TOURNIQUET CUFF) IMPLANT
CUFF TOURN SGL QUICK 18X4 (TOURNIQUET CUFF) IMPLANT
DRAPE FLUOR MINI C-ARM 54X84 (DRAPES) ×4 IMPLANT
DRAPE XRAY CASSETTE 23X24 (DRAPES) ×4 IMPLANT
DURAPREP 26ML APPLICATOR (WOUND CARE) ×4 IMPLANT
ELECT REM PT RETURN 9FT ADLT (ELECTROSURGICAL) ×3
ELECTRODE REM PT RTRN 9FT ADLT (ELECTROSURGICAL) ×3 IMPLANT
GAUZE 4X4 16PLY ~~LOC~~+RFID DBL (SPONGE) ×4 IMPLANT
GAUZE PACKING IODOFORM 1/2 (PACKING) ×4 IMPLANT
GAUZE SPONGE 4X4 12PLY STRL (GAUZE/BANDAGES/DRESSINGS) ×4 IMPLANT
GAUZE XEROFORM 1X8 LF (GAUZE/BANDAGES/DRESSINGS) ×4 IMPLANT
GLOVE SURG ENC MOIS LTX SZ7.5 (GLOVE) ×4 IMPLANT
GLOVE SURG UNDER LTX SZ8 (GLOVE) ×4 IMPLANT
GOWN STRL REUS W/ TWL XL LVL3 (GOWN DISPOSABLE) ×6 IMPLANT
GOWN STRL REUS W/TWL XL LVL3 (GOWN DISPOSABLE) ×2
IV NS IRRIG 3000ML ARTHROMATIC (IV SOLUTION) ×4 IMPLANT
KIT TURNOVER KIT A (KITS) ×4 IMPLANT
LABEL OR SOLS (LABEL) ×4 IMPLANT
MANIFOLD NEPTUNE II (INSTRUMENTS) ×4 IMPLANT
NDL FILTER BLUNT 18X1 1/2 (NEEDLE) ×1 IMPLANT
NDL HYPO 25X1 1.5 SAFETY (NEEDLE) ×1 IMPLANT
NEEDLE FILTER BLUNT 18X 1/2SAF (NEEDLE) ×1
NEEDLE FILTER BLUNT 18X1 1/2 (NEEDLE) ×2 IMPLANT
NEEDLE HYPO 25X1 1.5 SAFETY (NEEDLE) ×3 IMPLANT
NS IRRIG 500ML POUR BTL (IV SOLUTION) ×4 IMPLANT
PACK EXTREMITY ARMC (MISCELLANEOUS) ×4 IMPLANT
PAD ABD DERMACEA PRESS 5X9 (GAUZE/BANDAGES/DRESSINGS) ×8 IMPLANT
PULSAVAC PLUS IRRIG FAN TIP (DISPOSABLE) ×3
SHIELD FULL FACE ANTIFOG 7M (MISCELLANEOUS) ×4 IMPLANT
STOCKINETTE M/LG 89821 (MISCELLANEOUS) ×4 IMPLANT
STRAP SAFETY 5IN WIDE (MISCELLANEOUS) ×4 IMPLANT
SUT ETHILON 3-0 FS-10 30 BLK (SUTURE) ×3
SUT ETHILON 5-0 FS-2 18 BLK (SUTURE) ×4 IMPLANT
SUT VIC AB 4-0 FS2 27 (SUTURE) ×4 IMPLANT
SUTURE EHLN 3-0 FS-10 30 BLK (SUTURE) ×3 IMPLANT
SYR 10ML LL (SYRINGE) ×12 IMPLANT
TIP FAN IRRIG PULSAVAC PLUS (DISPOSABLE) ×3 IMPLANT

## 2021-01-17 NOTE — Progress Notes (Addendum)
Johnston Ebbs to be D/C'd Home per MD order.  Discussed prescriptions and follow up appointments with the patient. Prescriptions given to patient, medication list explained in detail. Pt verbalized understanding.  Allergies as of 01/17/2021   No Known Allergies      Medication List     TAKE these medications    aspirin EC 81 MG tablet Take 81 mg by mouth daily. Swallow whole.   atenolol 25 MG tablet Commonly known as: TENORMIN Take 25 mg by mouth daily.   cholecalciferol 25 MCG (1000 UNIT) tablet Commonly known as: VITAMIN D3 Take 1,000 Units by mouth daily.   doxycycline 100 MG EC tablet Commonly known as: DORYX Take 1 tablet (100 mg total) by mouth 2 (two) times daily for 14 days. Start taking on: January 18, 2021   levothyroxine 25 MCG tablet Commonly known as: SYNTHROID Take 25 mcg by mouth daily.   ramipril 5 MG capsule Commonly known as: ALTACE Take 10 mg by mouth daily.   repaglinide 1 MG tablet Commonly known as: PRANDIN Take 1 mg by mouth daily. Take 30 minutes prior to dinner.   rosuvastatin 20 MG tablet Commonly known as: CRESTOR Take 20 mg by mouth daily.        Vitals:   01/17/21 0743 01/17/21 1336  BP: (!) 175/86 (!) 161/92  Pulse: 60 (!) 51  Resp: 16   Temp: 97.9 F (36.6 C)   SpO2: 97%     Skin clean, dry and intact without evidence of skin break down, no evidence of skin tears noted. IV catheter discontinued intact. Site without signs and symptoms of complications. Dressing and pressure applied. Pt denies pain at this time. No complaints noted.  An After Visit Summary was printed and given to the patient. Patient escorted via WC, and D/C home via private auto.  Rigoberto Noel

## 2021-01-17 NOTE — Discharge Summary (Signed)
Physician Discharge Summary  Alexander Gill GNF:[id number] DOB: 03/04/1956 DOA: 01/16/2021  PCP: Pcp, No  Admit date: 01/16/2021 Discharge date: 01/17/2021  Admitted From: Home Disposition:  Home  Recommendations for Outpatient Follow-up:  Follow up with PCP in 1-2 weeks Follow up with podiatry in 1 week  Home Health:No Equipment/Devices:None  Discharge Condition:Stable CODE STATUS:Full Diet recommendation: Heart Healthy / Carb Modified  Brief/Interim Summary: 65 year old Caucasian male with history significant for type 2 diabetes mellitus presented to the ED with acute onset of left hallux and left third toe wounds that began with a blister 3 months ago and have been progressively worsening.  Patient has a history of diabetic foot infections on the contralateral foot.  He has a history of peripheral neuropathy with diabetes.  He is nonseptic appearing.  He previously presented to his primary care physician and was given a prescription for Augmentin and Bactrim and had an x-ray done 1 day prior to presentation.  After receiving results of x-ray primary care physician advised him to present to the emergency room.       Patient was evaluated by podiatry and MRI was pursued which did not demonstrate osteomyelitis.  Patient is tentatively scheduled for surgical intervention on 7/1 however podiatry will speak to the patient prior and address concerns.  After discussion with podiatry the patient elected not to proceed with surgery during this admission.  I discussed the case at length with podiatry consultant.  Podiatry evaluated the wound at bedside.  Felt presentation was more consistent with a chronic osteomyelitis and that deferring surgical evaluation outpatient would be an acceptable plan of care.  The pulses were palpable on exam thus ABIs were not pursued.  I called and discussed plan with the patient over the phone.  At this time because the patient is not wanting to proceed with surgery we can  discharge home on oral antibiotics.  Per podiatry recommendations will prescribe doxycycline 100 mg twice daily x14 days.  Follow-up instructions with podiatry were provided to patient verbally and included in the discharge packet.  All questions answered to patient satisfaction.  He will be discharged home in stable condition.  Discharge Diagnoses:  Active Problems:   Toe osteomyelitis (HCC)  Left foot osteomyelitis Evaluated by podiatry Wound probes to bone MRI positive for osteomyelitis Initial plan was for surgical management however after discussion with podiatry decision was made to defer to outpatient  I discussed this with patient and he is in agreement with the plan Discharge home with 14 days of doxycycline 100 mg p.o. twice daily Follow-up next week in podiatry office for further discussion and surgical management    Essential hypertension Can resume home regimen   Insulin-dependent diabetes mellitus Home metformin on hold Home dose of Basaglar 30 units Can resume home regimen   Hyperlipidemia PTA statin   Hypothyroid PTA Synthroid  Discharge Instructions  Discharge Instructions     Diet - low sodium heart healthy   Complete by: As directed    Increase activity slowly   Complete by: As directed       Allergies as of 01/17/2021   No Known Allergies      Medication List     TAKE these medications    aspirin EC 81 MG tablet Take 81 mg by mouth daily. Swallow whole.   atenolol 25 MG tablet Commonly known as: TENORMIN Take 25 mg by mouth daily.   cholecalciferol 25 MCG (1000 UNIT) tablet Commonly known as: VITAMIN D3 Take 1,000 Units by  mouth daily.   doxycycline 100 MG EC tablet Commonly known as: DORYX Take 1 tablet (100 mg total) by mouth 2 (two) times daily for 14 days. Start taking on: January 18, 2021   levothyroxine 25 MCG tablet Commonly known as: SYNTHROID Take 25 mcg by mouth daily.   ramipril 5 MG capsule Commonly known as:  ALTACE Take 10 mg by mouth daily.   repaglinide 1 MG tablet Commonly known as: PRANDIN Take 1 mg by mouth daily. Take 30 minutes prior to dinner.   rosuvastatin 20 MG tablet Commonly known as: CRESTOR Take 20 mg by mouth daily.        Follow-up Information     [name], DPM Follow up.   Specialty: Podiatry Why: Dr. Logan Bores will arrange a followup appt.  If you do not hear from their office, please call them at the office phone number Contact information: [address] [place] [address] [phone]                No Known Allergies  Consultations: [facility]   Procedures/Studies: MR FOOT LEFT WO CONTRAST  Result Date: 01/16/2021 CLINICAL DATA:  Left great toe and third toe wounds. EXAM: MRI OF THE LEFT FOOT WITHOUT CONTRAST TECHNIQUE: Multiplanar, multisequence MR imaging of the left forefoot was performed. No intravenous contrast was administered. COMPARISON:  Left foot x-rays from yesterday. FINDINGS: Bones/Joint/Cartilage Marrow edema in the first distal phalanx and first proximal phalanx head associated decreased T1 marrow signal. Similar signal abnormality in the third distal phalanx. Mild marrow edema in the second metatarsal head and base of the second proximal phalanx. No fracture or dislocation. Advanced degenerative changes of the first IP joint with lateral subluxation of the first distal phalanx. Mild second TMT joint osteoarthritis. Patchy nonspecific marrow edema within the navicular and cuboid. No joint effusion. Ligaments Collateral ligaments are intact. Muscles and Tendons Flexor and extensor tendons are intact. Increased T2 signal within the intrinsic muscles of the forefoot, nonspecific, but likely related to diabetic muscle changes. Soft tissue Diffuse soft tissue swelling, most prominent dorsally. Ulceration along the medial aspect of the great toe and tip of the third toe. No fluid collection or hematoma. No soft  tissue mass. IMPRESSION: 1. Ulceration along the medial aspect of the great toe and tip of the third toe. Osteomyelitis of the first distal and proximal phalanges as well as the third distal phalanx. No abscess. 2. Marrow edema in the second metatarsal head and base of the second proximal phalanx without nearby ulcer. This may be stress related or degenerative. Electronically Signed   By: Obie Dredge M.D.   On: 01/16/2021 12:23   DG Foot Complete Left  Result Date: 01/15/2021 CLINICAL DATA:  Foot ulceration, ulcers of the left great toe and third digit EXAM: LEFT FOOT - COMPLETE 3+ VIEW COMPARISON:  None. FINDINGS: Diffuse soft tissue swelling of the forefoot. More focal soft tissue ulceration is noted along the medial aspect of the first digit near the interphalangeal joint. There is subjacent subcortical osteopenia and destructive changes with partial lateral subluxation across the first interphalangeal joint concerning for features of septic arthritis and osteomyelitis. Additional ulceration seen involving the tip of the third digit small focus of soft tissue gas and subjacent transcortical lucency and subcortical osteopenia suggesting additional site of infection. Additional clawtoe deformities of the second through fifth rays. Mild degenerative changes in the midfoot. Slightly decreased calcaneal inclination angle could reflect a pes planus deformity  though incompletely assessed on nonweightbearing exam. Vascular calcium the soft tissues. No other acute or conspicuous osseous abnormality IMPRESSION: Soft tissue ulceration the medial first digit at the level of the interphalangeal joint. Destructive changes and subcortical osteopenia of the proximal middle distal phalanges with subluxation across the joint concerning for osteomyelitis and septic arthritis. Additional ulceration involving the tip of the third digit with questionable focus of soft tissue gas. Subjacent transcortical lucency and osteopenia  the third distal phalanx concerning for additional site of osteomyelitis. Further characterization could be made with MR imaging as clinically warranted. Electronically Signed   By: Kreg Shropshire M.D.   On: 01/15/2021 23:43   (Echo, Carotid, EGD, Colonoscopy, ERCP)    Subjective: Examined on the day of discharge.  Stable in no distress.  Nontoxic.  No pain endorsed.  Stable for discharge.  Discharge Exam: Vitals:   01/17/21 0743 01/17/21 1336  BP: (!) 175/86 (!) 161/92  Pulse: 60 (!) 51  Resp: 16   Temp: 97.9 F (36.6 C)   SpO2: 97%    Vitals:   01/16/21 2237 01/17/21 0344 01/17/21 0743 01/17/21 1336  BP: (!) 153/67 (!) 150/87 (!) 175/86 (!) 161/92  Pulse: (!) 57 61 60 (!) 51  Resp: 20 20 16    Temp: 97.8 F (36.6 C) 98.1 F (36.7 C) 97.9 F (36.6 C)   TempSrc: Oral  Oral   SpO2: 97% 97% 97%   Weight:      Height:        General: Pt is alert, awake, not in acute distress Cardiovascular: RRR, S1/S2 +, no rubs, no gallops Respiratory: CTA bilaterally, no wheezing, no rhonchi Abdominal: Soft, NT, ND, bowel sounds + Extremities: Left hallux and third toe scabbed ulcer with diffuse swelling and honey crusting.  Right plantar first metatarsal ulcer.    The results of significant diagnostics from this hospitalization (including imaging, microbiology, ancillary and laboratory) are listed below for reference.     Microbiology: Recent Results (from the past 240 hour(s))  Culture, blood (Routine X 2) w Reflex to ID Panel     Status: None (Preliminary result)   Collection Time: 01/16/21  1:20 AM   Specimen: BLOOD  Result Value Ref Range Status   Specimen Description BLOOD BLOOD LEFT FOREARM  Final   Special Requests   Final    BOTTLES DRAWN AEROBIC AND ANAEROBIC Blood Culture adequate volume   Culture   Final    NO GROWTH 1 DAY Performed at [facility], [address], [place], [place]    Report Status PENDING  Incomplete  Culture, blood (Routine X 2) w  Reflex to ID Panel     Status: None (Preliminary result)   Collection Time: 01/16/21  1:20 AM   Specimen: BLOOD  Result Value Ref Range Status   Specimen Description BLOOD BLOOD RIGHT FOREARM  Final   Special Requests   Final    BOTTLES DRAWN AEROBIC AND ANAEROBIC Blood Culture adequate volume   Culture   Final    NO GROWTH 1 DAY Performed at [facility], [address], [place], [place]    Report Status PENDING  Incomplete  Resp Panel by RT-PCR (Flu A&B, Covid) Nasopharyngeal Swab     Status: None   Collection Time: 01/16/21  1:20 AM   Specimen: Nasopharyngeal Swab; Nasopharyngeal(NP) swabs in vial transport medium  Result Value Ref Range Status   SARS Coronavirus 2 by RT PCR NEGATIVE NEGATIVE Final    Comment: (NOTE)  SARS-CoV-2 target nucleic acids are NOT DETECTED.  The SARS-CoV-2 RNA is generally detectable in upper respiratory specimens during the acute phase of infection. The lowest concentration of SARS-CoV-2 viral copies this assay can detect is 138 copies/mL. A negative result does not preclude SARS-Cov-2 infection and should not be used as the sole basis for treatment or other patient management decisions. A negative result may occur with  improper specimen collection/handling, submission of specimen other than nasopharyngeal swab, presence of viral mutation(s) within the areas targeted by this assay, and inadequate number of viral copies(<138 copies/mL). A negative result must be combined with clinical observations, patient history, and epidemiological information. The expected result is Negative.  Fact Sheet for Patients:  BloggerCourse.com  Fact Sheet for Healthcare Providers:  SeriousBroker.it  This test is no t yet approved or cleared by the [place] FDA and  has been authorized for detection and/or diagnosis of SARS-CoV-2 by FDA under an Emergency Use Authorization (EUA). This EUA will  remain  in effect (meaning this test can be used) for the duration of the COVID-19 declaration under Section 564(b)(1) of the Act, 21 U.S.C.section 360bbb-3(b)(1), unless the authorization is terminated  or revoked sooner.       Influenza A by PCR NEGATIVE NEGATIVE Final   Influenza B by PCR NEGATIVE NEGATIVE Final    Comment: (NOTE) The Xpert Xpress SARS-CoV-2/FLU/RSV plus assay is intended as an aid in the diagnosis of influenza from Nasopharyngeal swab specimens and should not be used as a sole basis for treatment. Nasal washings and aspirates are unacceptable for Xpert Xpress SARS-CoV-2/FLU/RSV testing.  Fact Sheet for Patients: BloggerCourse.com  Fact Sheet for Healthcare Providers: SeriousBroker.it  This test is not yet approved or cleared by the [place] FDA and has been authorized for detection and/or diagnosis of SARS-CoV-2 by FDA under an Emergency Use Authorization (EUA). This EUA will remain in effect (meaning this test can be used) for the duration of the COVID-19 declaration under Section 564(b)(1) of the Act, 21 U.S.C. section 360bbb-3(b)(1), unless the authorization is terminated or revoked.  Performed at [facility], [address] [place], [place] [address]      Labs: BNP (last 3 results) No results for input(s): BNP in the last 8760 hours. Basic Metabolic Panel: Recent Labs  Lab 01/15/21 1929 01/16/21 0139 01/17/21 0537  NA 139 140  --   K 3.9 3.5  --   CL 105 107  --   CO2 25 28  --   GLUCOSE 161* 124*  --   BUN 14 14  --   CREATININE 0.86 0.82 0.75  CALCIUM 9.1 8.7*  --    Liver Function Tests: No results for input(s): AST, ALT, ALKPHOS, BILITOT, PROT, ALBUMIN in the last 168 hours. No results for input(s): LIPASE, AMYLASE in the last 168 hours. No results for input(s): AMMONIA in the last 168 hours. CBC: Recent Labs  Lab 01/15/21 1929 01/16/21 0139  WBC 13.4* 11.0*   HGB 14.7 14.1  HCT 42.8 41.6  MCV 88.1 89.3  PLT 241 206   Cardiac Enzymes: No results for input(s): CKTOTAL, CKMB, CKMBINDEX, TROPONINI in the last 168 hours. BNP: Invalid input(s): POCBNP CBG: Recent Labs  Lab 01/16/21 1303 01/16/21 1821 01/16/21 2054 01/17/21 0740 01/17/21 1207  GLUCAP 197* 271* 197* 171* 133*   D-Dimer No results for input(s): DDIMER in the last 72 hours. Hgb A1c Recent Labs    01/15/21 1929  HGBA1C 8.6*   Lipid Profile No results for  input(s): CHOL, HDL, LDLCALC, TRIG, CHOLHDL, LDLDIRECT in the last 72 hours. Thyroid function studies No results for input(s): TSH, T4TOTAL, T3FREE, THYROIDAB in the last 72 hours.  Invalid input(s): FREET3 Anemia work up No results for input(s): VITAMINB12, FOLATE, FERRITIN, TIBC, IRON, RETICCTPCT in the last 72 hours. Urinalysis No results found for: COLORURINE, APPEARANCEUR, LABSPEC, PHURINE, GLUCOSEU, HGBUR, BILIRUBINUR, KETONESUR, PROTEINUR, UROBILINOGEN, NITRITE, LEUKOCYTESUR Sepsis Labs Invalid input(s): PROCALCITONIN,  WBC,  LACTICIDVEN Microbiology Recent Results (from the past 240 hour(s))  Culture, blood (Routine X 2) w Reflex to ID Panel     Status: None (Preliminary result)   Collection Time: 01/16/21  1:20 AM   Specimen: BLOOD  Result Value Ref Range Status   Specimen Description BLOOD BLOOD LEFT FOREARM  Final   Special Requests   Final    BOTTLES DRAWN AEROBIC AND ANAEROBIC Blood Culture adequate volume   Culture   Final    NO GROWTH 1 DAY Performed at [facility], [address] [place], [place] [address]    Report Status PENDING  Incomplete  Culture, blood (Routine X 2) w Reflex to ID Panel     Status: None (Preliminary result)   Collection Time: 01/16/21  1:20 AM   Specimen: BLOOD  Result Value Ref Range Status   Specimen Description BLOOD BLOOD RIGHT FOREARM  Final   Special Requests   Final    BOTTLES DRAWN AEROBIC AND ANAEROBIC Blood Culture adequate volume   Culture    Final    NO GROWTH 1 DAY Performed at [facility], [address] [place], [place] [address]    Report Status PENDING  Incomplete  Resp Panel by RT-PCR (Flu A&B, Covid) Nasopharyngeal Swab     Status: None   Collection Time: 01/16/21  1:20 AM   Specimen: Nasopharyngeal Swab; Nasopharyngeal(NP) swabs in vial transport medium  Result Value Ref Range Status   SARS Coronavirus 2 by RT PCR NEGATIVE NEGATIVE Final    Comment: (NOTE) SARS-CoV-2 target nucleic acids are NOT DETECTED.  The SARS-CoV-2 RNA is generally detectable in upper respiratory specimens during the acute phase of infection. The lowest concentration of SARS-CoV-2 viral copies this assay can detect is 138 copies/mL. A negative result does not preclude SARS-Cov-2 infection and should not be used as the sole basis for treatment or other patient management decisions. A negative result may occur with  improper specimen collection/handling, submission of specimen other than nasopharyngeal swab, presence of viral mutation(s) within the areas targeted by this assay, and inadequate number of viral copies(<138 copies/mL). A negative result must be combined with clinical observations, patient history, and epidemiological information. The expected result is Negative.  Fact Sheet for Patients:  BloggerCourse.com  Fact Sheet for Healthcare Providers:  SeriousBroker.it  This test is no t yet approved or cleared by the [place] FDA and  has been authorized for detection and/or diagnosis of SARS-CoV-2 by FDA under an Emergency Use Authorization (EUA). This EUA will remain  in effect (meaning this test can be used) for the duration of the COVID-19 declaration under Section 564(b)(1) of the Act, 21 U.S.C.section 360bbb-3(b)(1), unless the authorization is terminated  or revoked sooner.       Influenza A by PCR NEGATIVE NEGATIVE Final   Influenza B by PCR NEGATIVE  NEGATIVE Final    Comment: (NOTE) The Xpert Xpress SARS-CoV-2/FLU/RSV plus assay is intended as an aid in the diagnosis of influenza from Nasopharyngeal swab specimens and should not be used as a sole basis  for treatment. Nasal washings and aspirates are unacceptable for Xpert Xpress SARS-CoV-2/FLU/RSV testing.  Fact Sheet for Patients: BloggerCourse.comhttps://www.fda.gov/media/152166/download  Fact Sheet for Healthcare Providers: SeriousBroker.ithttps://www.fda.gov/media/152162/download  This test is not yet approved or cleared by the [place] FDA and has been authorized for detection and/or diagnosis of SARS-CoV-2 by FDA under an Emergency Use Authorization (EUA). This EUA will remain in effect (meaning this test can be used) for the duration of the COVID-19 declaration under Section 564(b)(1) of the Act, 21 U.S.C. section 360bbb-3(b)(1), unless the authorization is terminated or revoked.  Performed at [facility], [address] [place], [place] [address]      Time coordinating discharge: Over 30 minutes  SIGNED:   Tresa MooreSudheer B Kimmarie Pascale, MD  [facility] 01/17/2021, 3:53 PM Pager   If 7PM-7AM, please contact night-coverage

## 2021-01-17 NOTE — Plan of Care (Signed)
Continuing with plan of care. 

## 2021-01-17 NOTE — Progress Notes (Signed)
Pharmacy Antibiotic Note  Alexander Gill is a 65 y.o. male admitted on 01/16/2021 with cellulitis w/ foot ulcers identified as osteomyelitis. Surgical and vascular workup is ongoing. Pharmacy has been consulted for vancomycin and cefepime dosing. His renal function is stable and at apparent baseline  Plan:  1) continue cefepime 2 grams IV every 8 hours  2) adjust vancomycin dose to 1000 mg IV every 8 hours Goal AUC 400-550 Expected AUC: 530.4 SCr used: 0.80 (rounded up) Ke: 0.091 h-1, T1/2: 7.7h Css (calculated): 31.1 / 16.5 mcg/mL Daily renal function assessment while on IV vancomycin  Height: 6' (182.9 cm) Weight: 86.8 kg (191 lb 5.8 oz) IBW/kg (Calculated) : 77.6  Temp (24hrs), Avg:98.1 F (36.7 C), Min:97.8 F (36.6 C), Max:98.4 F (36.9 C)  Recent Labs  Lab 01/15/21 1929 01/16/21 0120 01/16/21 0139 01/16/21 0234 01/17/21 0537  WBC 13.4*  --  11.0*  --   --   CREATININE 0.86  --  0.82  --  0.75  LATICACIDVEN  --  1.7  --  1.4  --      Estimated Creatinine Clearance: 103.7 mL/min (by C-G formula based on SCr of 0.75 mg/dL).    No Known Allergies  Antimicrobials this admission: 06/30 vancomycin >>  06/30 cefepime >>  Microbiology results: 06/30 BCx: NG x 1 day 06/30 SARS CoV-2: negative 06/30 influenza A/B: negative  Thank you for allowing pharmacy to be a part of this patient's care.  Burnis Medin, PharmD 01/17/2021 7:05 AM

## 2021-01-17 NOTE — Interval H&P Note (Signed)
History and Physical Interval Note:  01/17/2021 2:52 PM  Alexander Gill  has presented today for surgery, with the diagnosis of Toe Amputation Bone Biopsy.  The various methods of treatment have been discussed with the patient and family. After consideration of risks, benefits and other options for treatment, the patient has consented to  Procedure(s): AMPUTATION TOE-BIG TOE AND THIRD TOE (Left) BONE BIOPSY-SECOND METATARSAL (Left) as a surgical intervention.  The patient's history has been reviewed, patient examined, no change in status, stable for surgery.  I have reviewed the patient's chart and labs.  Questions were answered to the patient's satisfaction.     [name]

## 2021-01-17 NOTE — Progress Notes (Addendum)
Inpatient Diabetes Program Recommendations  AACE/ADA: New Consensus Statement on Inpatient Glycemic Control (2015)  Target Ranges:  Prepandial:   less than 140 mg/dL      Peak postprandial:   less than 180 mg/dL (1-2 hours)      Critically ill patients:  140 - 180 mg/dL   Lab Results  Component Value Date   GLUCAP 171 (H) 01/17/2021   HGBA1C 8.6 (H) 01/15/2021    Review of Glycemic Control  Diabetes history: DM 2 Outpatient Diabetes medications: Basaglar 30 units, Xigduo 11-998 mg Daily, Prandin 1 mg tid Current orders for Inpatient glycemic control:  Lantus 5 units Novolog 0-15 units tid + hs Prandin 1 mg Daily  Inpatient Diabetes Program Recommendations:    Spoke with pt at bedside regarding Glucose control and home medications. Pt relocated from [place] 2 months ago. There were issues with his Davonna Belling (Farxiga and metformin combo med) medication and he needed pre-authorization with current insurance from current employer. With all of his medications his A1c levels are 6.8%. Pt needs another oral medication for a short time until the Davonna Belling is approved. Pt reports glucose levels have been elevated with only Basaglar and Prandin tid. Pt also reports his PCP in [place] wanted to get him off of Prandin. Discussed current A1c level and glucose trends. Discussed glucose and A1c goals and importance fo glucose control for wound healing. Pt is currently temporarily living with his daughter and tries to portion control his meals since he cannot control what is provided.  At time of d/c: - Consider Amaryl 1-2 mg Daily  Thanks,  Christena Deem RN, MSN, BC-ADM Inpatient Diabetes Coordinator Team Pager [phone] (8a-5p)

## 2021-01-17 NOTE — Progress Notes (Signed)
PROGRESS NOTE    Johnston EbbsWesley Nunziata  NFA:[id number]:[id number] DOB: 08/28/1955 DOA: 01/16/2021 PCP: Pcp, No    Brief Narrative:   65 year old Caucasian male with history significant for type 2 diabetes mellitus presented to the ED with acute onset of left hallux and left third toe wounds that began with a blister 3 months ago and have been progressively worsening.  Patient has a history of diabetic foot infections on the contralateral foot.  He has a history of peripheral neuropathy with diabetes.  He is nonseptic appearing.  He previously presented to his primary care physician and was given a prescription for Augmentin and Bactrim and had an x-ray done 1 day prior to presentation.  After receiving results of x-ray primary care physician advised him to present to the emergency room.      Patient was evaluated by podiatry and MRI was pursued which did not demonstrate osteomyelitis.  Patient is tentatively scheduled for surgical intervention on 7/1 however podiatry will speak to the patient prior and address concerns   Assessment & Plan:   Active Problems:   Toe osteomyelitis (HCC)  Left foot osteomyelitis Evaluated by podiatry Wound probes to bone MRI positive for osteomyelitis Podiatry recommending surgical management Tentatively plan for 7/1 Plan: Continue broad-spectrum antibiotics for now Continue IV hydration while n.p.o. Podiatry consult Dr. Logan BoresEvans to come and speak with the patient prior and determine surgical plan As needed pain management  Essential hypertension Home medications restarted including atenolol, ramipril As needed IV hydralazine  Insulin-dependent diabetes mellitus Home metformin on hold Home dose of Basaglar 30 units Plan: Will initiate 5 units Lantus given n.p.o. status Moderate sliding scale Carb modified diet once advanced  Hyperlipidemia PTA statin  Hypothyroid PTA Synthroid     DVT prophylaxis: SQ Lovenox Code Status: Full Family Communication: Wife over  phone Disposition Plan: Status is: Inpatient  Remains inpatient appropriate because:Inpatient level of care appropriate due to severity of illness  Dispo: The patient is from: Home              Anticipated d/c is to: Home              Patient currently is not medically stable to d/c.   Difficult to place patient No  Pending formal surgical evaluation and intervention     Level of care: Med-Surg  Consultants:  Podiatry  Procedures:  None  Antimicrobials:  Vancomycin Zosyn   Subjective: Patient seen and examined.  Resting comfortably in bed.  No visible distress.  Complains of headache  Objective: Vitals:   01/16/21 2049 01/16/21 2237 01/17/21 0344 01/17/21 0743  BP: (!) 197/82 (!) 153/67 (!) 150/87 (!) 175/86  Pulse: (!) 54 (!) 57 61 60  Resp: 20 20 20 16   Temp: 98.2 F (36.8 C) 97.8 F (36.6 C) 98.1 F (36.7 C) 97.9 F (36.6 C)  TempSrc: Oral Oral  Oral  SpO2: 98% 97% 97% 97%  Weight: 86.8 kg     Height: 6' (1.829 m)       Intake/Output Summary (Last 24 hours) at 01/17/2021 1307 Last data filed at 01/17/2021 1201 Gross per 24 hour  Intake 3415.61 ml  Output --  Net 3415.61 ml   Filed Weights   01/15/21 1918 01/16/21 2049  Weight: 88.5 kg 86.8 kg    Examination:  General exam: Appears calm and comfortable  Respiratory system: Clear to auscultation. Respiratory effort normal. Cardiovascular system: S1 & S2 heard, RRR. No JVD, murmurs, rubs, gallops or clicks. No pedal edema. Gastrointestinal  system: Abdomen is nondistended, soft and nontender. No organomegaly or masses felt. Normal bowel sounds heard. Central nervous system: Alert and oriented. No focal neurological deficits. Extremities: Left hallux and third toe scabbed ulcer with diffuse swelling and honey crusting.  Right plantar first metatarsal ulcer. Skin: No rashes, lesions or ulcers Psychiatry: Judgement and insight appear normal. Mood & affect appropriate.     Data Reviewed: I have  personally reviewed following labs and imaging studies  CBC: Recent Labs  Lab 01/15/21 1929 01/16/21 0139  WBC 13.4* 11.0*  HGB 14.7 14.1  HCT 42.8 41.6  MCV 88.1 89.3  PLT 241 206   Basic Metabolic Panel: Recent Labs  Lab 01/15/21 1929 01/16/21 0139 01/17/21 0537  NA 139 140  --   K 3.9 3.5  --   CL 105 107  --   CO2 25 28  --   GLUCOSE 161* 124*  --   BUN 14 14  --   CREATININE 0.86 0.82 0.75  CALCIUM 9.1 8.7*  --    GFR: Estimated Creatinine Clearance: 103.7 mL/min (by C-G formula based on SCr of 0.75 mg/dL). Liver Function Tests: No results for input(s): AST, ALT, ALKPHOS, BILITOT, PROT, ALBUMIN in the last 168 hours. No results for input(s): LIPASE, AMYLASE in the last 168 hours. No results for input(s): AMMONIA in the last 168 hours. Coagulation Profile: Recent Labs  Lab 01/16/21 0120  INR 1.0   Cardiac Enzymes: No results for input(s): CKTOTAL, CKMB, CKMBINDEX, TROPONINI in the last 168 hours. BNP (last 3 results) No results for input(s): PROBNP in the last 8760 hours. HbA1C: Recent Labs    01/15/21 1929  HGBA1C 8.6*   CBG: Recent Labs  Lab 01/16/21 1303 01/16/21 1821 01/16/21 2054 01/17/21 0740 01/17/21 1207  GLUCAP 197* 271* 197* 171* 133*   Lipid Profile: No results for input(s): CHOL, HDL, LDLCALC, TRIG, CHOLHDL, LDLDIRECT in the last 72 hours. Thyroid Function Tests: No results for input(s): TSH, T4TOTAL, FREET4, T3FREE, THYROIDAB in the last 72 hours. Anemia Panel: No results for input(s): VITAMINB12, FOLATE, FERRITIN, TIBC, IRON, RETICCTPCT in the last 72 hours. Sepsis Labs: Recent Labs  Lab 01/16/21 0120 01/16/21 0234  LATICACIDVEN 1.7 1.4    Recent Results (from the past 240 hour(s))  Culture, blood (Routine X 2) w Reflex to ID Panel     Status: None (Preliminary result)   Collection Time: 01/16/21  1:20 AM   Specimen: BLOOD  Result Value Ref Range Status   Specimen Description BLOOD BLOOD LEFT FOREARM  Final   Special  Requests   Final    BOTTLES DRAWN AEROBIC AND ANAEROBIC Blood Culture adequate volume   Culture   Final    NO GROWTH 1 DAY Performed at [facility], [address], [place], [place] [address]    Report Status PENDING  Incomplete  Culture, blood (Routine X 2) w Reflex to ID Panel     Status: None (Preliminary result)   Collection Time: 01/16/21  1:20 AM   Specimen: BLOOD  Result Value Ref Range Status   Specimen Description BLOOD BLOOD RIGHT FOREARM  Final   Special Requests   Final    BOTTLES DRAWN AEROBIC AND ANAEROBIC Blood Culture adequate volume   Culture   Final    NO GROWTH 1 DAY Performed at [facility], [address] [place], [place] [address]    Report Status PENDING  Incomplete  Resp Panel by RT-PCR (Flu A&B, Covid) Nasopharyngeal Swab  Status: None   Collection Time: 01/16/21  1:20 AM   Specimen: Nasopharyngeal Swab; Nasopharyngeal(NP) swabs in vial transport medium  Result Value Ref Range Status   SARS Coronavirus 2 by RT PCR NEGATIVE NEGATIVE Final    Comment: (NOTE) SARS-CoV-2 target nucleic acids are NOT DETECTED.  The SARS-CoV-2 RNA is generally detectable in upper respiratory specimens during the acute phase of infection. The lowest concentration of SARS-CoV-2 viral copies this assay can detect is 138 copies/mL. A negative result does not preclude SARS-Cov-2 infection and should not be used as the sole basis for treatment or other patient management decisions. A negative result may occur with  improper specimen collection/handling, submission of specimen other than nasopharyngeal swab, presence of viral mutation(s) within the areas targeted by this assay, and inadequate number of viral copies(<138 copies/mL). A negative result must be combined with clinical observations, patient history, and epidemiological information. The expected result is Negative.  Fact Sheet for Patients:  BloggerCourse.com  Fact  Sheet for Healthcare Providers:  SeriousBroker.it  This test is no t yet approved or cleared by the [place] FDA and  has been authorized for detection and/or diagnosis of SARS-CoV-2 by FDA under an Emergency Use Authorization (EUA). This EUA will remain  in effect (meaning this test can be used) for the duration of the COVID-19 declaration under Section 564(b)(1) of the Act, 21 U.S.C.section 360bbb-3(b)(1), unless the authorization is terminated  or revoked sooner.       Influenza A by PCR NEGATIVE NEGATIVE Final   Influenza B by PCR NEGATIVE NEGATIVE Final    Comment: (NOTE) The Xpert Xpress SARS-CoV-2/FLU/RSV plus assay is intended as an aid in the diagnosis of influenza from Nasopharyngeal swab specimens and should not be used as a sole basis for treatment. Nasal washings and aspirates are unacceptable for Xpert Xpress SARS-CoV-2/FLU/RSV testing.  Fact Sheet for Patients: BloggerCourse.com  Fact Sheet for Healthcare Providers: SeriousBroker.it  This test is not yet approved or cleared by the [place] FDA and has been authorized for detection and/or diagnosis of SARS-CoV-2 by FDA under an Emergency Use Authorization (EUA). This EUA will remain in effect (meaning this test can be used) for the duration of the COVID-19 declaration under Section 564(b)(1) of the Act, 21 U.S.C. section 360bbb-3(b)(1), unless the authorization is terminated or revoked.  Performed at [facility], [address], [place], [place] [address]          Radiology Studies: MR FOOT LEFT WO CONTRAST  Result Date: 01/16/2021 CLINICAL DATA:  Left great toe and third toe wounds. EXAM: MRI OF THE LEFT FOOT WITHOUT CONTRAST TECHNIQUE: Multiplanar, multisequence MR imaging of the left forefoot was performed. No intravenous contrast was administered. COMPARISON:  Left foot x-rays from yesterday. FINDINGS:  Bones/Joint/Cartilage Marrow edema in the first distal phalanx and first proximal phalanx head associated decreased T1 marrow signal. Similar signal abnormality in the third distal phalanx. Mild marrow edema in the second metatarsal head and base of the second proximal phalanx. No fracture or dislocation. Advanced degenerative changes of the first IP joint with lateral subluxation of the first distal phalanx. Mild second TMT joint osteoarthritis. Patchy nonspecific marrow edema within the navicular and cuboid. No joint effusion. Ligaments Collateral ligaments are intact. Muscles and Tendons Flexor and extensor tendons are intact. Increased T2 signal within the intrinsic muscles of the forefoot, nonspecific, but likely related to diabetic muscle changes. Soft tissue Diffuse soft tissue swelling, most prominent dorsally. Ulceration along the medial aspect of the great  toe and tip of the third toe. No fluid collection or hematoma. No soft tissue mass. IMPRESSION: 1. Ulceration along the medial aspect of the great toe and tip of the third toe. Osteomyelitis of the first distal and proximal phalanges as well as the third distal phalanx. No abscess. 2. Marrow edema in the second metatarsal head and base of the second proximal phalanx without nearby ulcer. This may be stress related or degenerative. Electronically Signed   By: Obie Dredge M.D.   On: 01/16/2021 12:23   DG Foot Complete Left  Result Date: 01/15/2021 CLINICAL DATA:  Foot ulceration, ulcers of the left great toe and third digit EXAM: LEFT FOOT - COMPLETE 3+ VIEW COMPARISON:  None. FINDINGS: Diffuse soft tissue swelling of the forefoot. More focal soft tissue ulceration is noted along the medial aspect of the first digit near the interphalangeal joint. There is subjacent subcortical osteopenia and destructive changes with partial lateral subluxation across the first interphalangeal joint concerning for features of septic arthritis and osteomyelitis.  Additional ulceration seen involving the tip of the third digit small focus of soft tissue gas and subjacent transcortical lucency and subcortical osteopenia suggesting additional site of infection. Additional clawtoe deformities of the second through fifth rays. Mild degenerative changes in the midfoot. Slightly decreased calcaneal inclination angle could reflect a pes planus deformity though incompletely assessed on nonweightbearing exam. Vascular calcium the soft tissues. No other acute or conspicuous osseous abnormality IMPRESSION: Soft tissue ulceration the medial first digit at the level of the interphalangeal joint. Destructive changes and subcortical osteopenia of the proximal middle distal phalanges with subluxation across the joint concerning for osteomyelitis and septic arthritis. Additional ulceration involving the tip of the third digit with questionable focus of soft tissue gas. Subjacent transcortical lucency and osteopenia the third distal phalanx concerning for additional site of osteomyelitis. Further characterization could be made with MR imaging as clinically warranted. Electronically Signed   By: Kreg Shropshire M.D.   On: 01/15/2021 23:43        Scheduled Meds:  aspirin EC  81 mg Oral Daily   atenolol  25 mg Oral Daily   cholecalciferol  1,000 Units Oral Daily   enoxaparin (LOVENOX) injection  40 mg Subcutaneous Q24H   insulin aspart  0-15 Units Subcutaneous TID WC   insulin aspart  0-5 Units Subcutaneous QHS   insulin glargine  5 Units Subcutaneous Daily   [START ON 01/18/2021] levothyroxine  25 mcg Oral QAC breakfast   ramipril  10 mg Oral Daily   repaglinide  1 mg Oral Daily   rosuvastatin  20 mg Oral Daily   Continuous Infusions:  sodium chloride 100 mL/hr at 01/17/21 1201   ceFEPime (MAXIPIME) IV Stopped (01/17/21 3785)   vancomycin       LOS: 1 day    Time spent: 25 minutes    Tresa Moore, MD [facility] Pager [phone]  If 7PM-7AM, please  contact night-coverage 01/17/2021, 1:07 PM

## 2021-01-17 NOTE — Progress Notes (Signed)
PODIATRY progress note  NAME Alexander Gill MRN [id number] DOB 12-21-1955 DOA 01/16/2021   Reason for consult: Osteomyelitis left foot Chief Complaint  Patient presents with   Foot Ulcer     Past Medical History:  Diagnosis Date   Cataract    bilateral eyes   Diabetes mellitus without complication (HCC)    Heart abnormality    Enlarged left ventricle   Hypertension     CBC Latest Ref Rng & Units 01/16/2021 01/15/2021  WBC 4.0 - 10.5 K/uL 11.0(H) 13.4(H)  Hemoglobin 13.0 - 17.0 g/dL 78.2 95.6  Hematocrit 21.3 - 52.0 % 41.6 42.8  Platelets 150 - 400 K/uL 206 241    BMP Latest Ref Rng & Units 01/17/2021 01/16/2021 01/15/2021  Glucose 70 - 99 mg/dL - 086(V) 784(O)  BUN 8 - 23 mg/dL - 14 14  Creatinine 9.62 - 1.24 mg/dL 9.52 8.41 3.24  Sodium 135 - 145 mmol/L - 140 139  Potassium 3.5 - 5.1 mmol/L - 3.5 3.9  Chloride 98 - 111 mmol/L - 107 105  CO2 22 - 32 mmol/L - 28 25  Calcium 8.9 - 10.3 mg/dL - 8.7(L) 9.1         Physical Exam: No erythema noted.  Pulses to the left lower extremity are palpable.  DP and PT pulses palpable.  Popliteal artery palpable behind the knee.  Minimal edema noted.  No malodor noted.  The ulcers to the left great toe and left third toe do appear to be somewhat stable.  Minimal drainage noted there is an excessive amount of hyperkeratotic eschar tissue overlying the wounds.  Please see above pictures.  MR LT FOOT WO CONTRAST 01/16/2021 IMPRESSION: 1. Ulceration along the medial aspect of the great toe and tip of the third toe. Osteomyelitis of the first distal and proximal phalanges as well as the third distal phalanx. No abscess. 2. Marrow edema in the second metatarsal head and base of the second proximal phalanx without nearby ulcer. This may be stress related or degenerative.    ASSESSMENT/PLAN OF CARE 1.  Osteomyelitis with ulcers left great toe and left third toe -After discussion with the patient today, the patient is very concerned  because he has personal matters that he needs to attend to.  He would like to postpone surgery if possible and perform the surgery outpatient.  After reviewing the patient's chart and clinical exam I do not believe there is an acute concern.  It appears that the osteomyelitis appears more chronic in nature.  The patient agrees and is willing to have surgery however he would like to have 1-2 weeks to arrange his personal affairs outpatient -After reviewing the patient's chart and clinical exam I am amenable to this plan.  Discussed the plan with the hospitalist who is okay with discharge.  Patient to follow-up in the office next week to schedule outpatient surgery. -Recommend discharge with oral antibiotics -Patient will follow-up outpatient in the office -Podiatry to sign off    Thank you for allowing me to assist in this patient's care.  Please contact me directly with any questions or concerns.  Cell [phone]   [name], DPM [facility]  Dr. [name], DPM    [address]  [place], [place] [address]                Office [phone]  Fax [phone]

## 2021-01-21 LAB — CULTURE, BLOOD (ROUTINE X 2)
Culture: NO GROWTH
Culture: NO GROWTH
Special Requests: ADEQUATE
Special Requests: ADEQUATE

## 2021-01-22 ENCOUNTER — Ambulatory Visit: Payer: No Typology Code available for payment source | Admitting: Internal Medicine

## 2021-02-04 ENCOUNTER — Other Ambulatory Visit: Payer: Self-pay

## 2021-02-04 ENCOUNTER — Encounter: Payer: Self-pay | Admitting: Infectious Diseases

## 2021-02-04 ENCOUNTER — Other Ambulatory Visit
Admission: RE | Admit: 2021-02-04 | Discharge: 2021-02-04 | Disposition: A | Discharge status: Home / Self Care | Payer: PRIVATE HEALTH INSURANCE | Source: Ambulatory Visit | Attending: Infectious Diseases | Admitting: Infectious Diseases

## 2021-02-04 ENCOUNTER — Ambulatory Visit: Payer: PRIVATE HEALTH INSURANCE | Attending: Infectious Diseases | Admitting: Infectious Diseases

## 2021-02-04 VITALS — BP 161/89 | HR 67 | Temp 98.0°F | Resp 16 | Ht 72.0 in | Wt 190.0 lb

## 2021-02-04 DIAGNOSIS — E1142 Type 2 diabetes mellitus with diabetic polyneuropathy: Secondary | ICD-10-CM | POA: Insufficient documentation

## 2021-02-04 DIAGNOSIS — L089 Local infection of the skin and subcutaneous tissue, unspecified: Secondary | ICD-10-CM | POA: Diagnosis present

## 2021-02-04 DIAGNOSIS — E1169 Type 2 diabetes mellitus with other specified complication: Secondary | ICD-10-CM | POA: Diagnosis present

## 2021-02-04 DIAGNOSIS — I10 Essential (primary) hypertension: Secondary | ICD-10-CM | POA: Diagnosis not present

## 2021-02-04 DIAGNOSIS — Z7984 Long term (current) use of oral hypoglycemic drugs: Secondary | ICD-10-CM | POA: Insufficient documentation

## 2021-02-04 DIAGNOSIS — Z79899 Other long term (current) drug therapy: Secondary | ICD-10-CM | POA: Diagnosis not present

## 2021-02-04 DIAGNOSIS — Z7982 Long term (current) use of aspirin: Secondary | ICD-10-CM | POA: Insufficient documentation

## 2021-02-04 DIAGNOSIS — Z794 Long term (current) use of insulin: Secondary | ICD-10-CM | POA: Diagnosis not present

## 2021-02-04 DIAGNOSIS — B9561 Methicillin susceptible Staphylococcus aureus infection as the cause of diseases classified elsewhere: Secondary | ICD-10-CM | POA: Diagnosis not present

## 2021-02-04 DIAGNOSIS — M86672 Other chronic osteomyelitis, left ankle and foot: Secondary | ICD-10-CM | POA: Insufficient documentation

## 2021-02-04 DIAGNOSIS — E11628 Type 2 diabetes mellitus with other skin complications: Secondary | ICD-10-CM

## 2021-02-04 DIAGNOSIS — Z79890 Hormone replacement therapy (postmenopausal): Secondary | ICD-10-CM | POA: Insufficient documentation

## 2021-02-04 MED ORDER — AMOXICILLIN-POT CLAVULANATE 875-125 MG PO TABS: 1.0000 | ORAL_TABLET | Freq: Two times a day (BID) | ORAL | 1 refills | Status: DC

## 2021-02-04 NOTE — Progress Notes (Signed)
NAME: Alexander Gill  DOB: 1955-08-04  MRN: [id number]  Date/Time: 02/04/2021 9:43 AM  REQUESTING PROVIDER: Dr.Baker Subjective:  REASON FOR CONSULT: left toes infection ? Alexander Gill is a 65 y.o. male with a history of DM, peripheral neuropathy, HTN is referred to me by Dr.Baker for left foot infection- pt used to live in [place] and is now in [place]. 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 He noted a blister on his left great toe a few months ago and treated with betadine. He then noted a blister on the 3rd toe and it started to get worse with discharge , swelling , pain. He went to PCP who gave him augmentin and took an xray foot and asked him to go to ED . He was in the hospital between 6/30-7/1 and MRI done then showed Ulceration along the medial aspect of the great toe and tip of the third toe. Osteomyelitis of the first distal and proximal phalanges as well as the third distal phalanx. No abscess. After discharge he saw Dr.Baker podiatrist who took cultures put him on Doxy and sent him to me. Pt says he does not want any surgery, he prefers not to have IV antibiotics as he wants to continue working and is also moving home He had similar infection on the rt toe many years ago and was treated with IV antibiotics He has no fever or chills DM is not well controlled  Past Medical History:  Diagnosis Date   Cataract    bilateral eyes   Diabetes mellitus without complication (HCC)    Heart abnormality    Enlarged left ventricle   Hypertension     Past Surgical History:  Procedure Laterality Date   HERNIA REPAIR      Social History   Socioeconomic History   Marital status: Married    Spouse name: Not on file   Number of children: Not on file   Years of education: Not on file   Highest education level: Not on file  Occupational History   Not on file  Tobacco Use   Smoking status: Never   Smokeless tobacco: Never  Substance and Sexual Activity   Alcohol  use: Not Currently   Drug use: Not Currently   Sexual activity: Not Currently  Other Topics Concern   Not on file  Social History Narrative   Not on file   Social Determinants of Health   Financial Resource Strain: Not on file  Food Insecurity: Not on file  Transportation Needs: Not on file  Physical Activity: Not on file  Stress: Not on file  Social Connections: Not on file  Intimate Partner Violence: Not on file    No family history on file. No Known Allergies I? Current Outpatient Medications  Medication Sig Dispense Refill   aspirin EC 81 MG tablet Take 81 mg by mouth daily. Swallow whole.     atenolol (TENORMIN) 25 MG tablet Take 25 mg by mouth daily.     cholecalciferol (VITAMIN D3) 25 MCG (1000 UNIT) tablet Take 1,000 Units by mouth daily.     Dapagliflozin-metFORMIN HCl ER (XIGDUO XR) 11-998 MG TB24 Take 1 tablet by mouth daily at 2 am.     doxycycline (DORYX) 100 MG EC tablet Take 100 mg by mouth 2 (two) times daily.     levothyroxine (SYNTHROID) 25 MCG tablet Take 25 mcg by mouth daily.     repaglinide (PRANDIN) 1 MG tablet Take 1 mg by  mouth daily. Take 30 minutes prior to dinner.     rosuvastatin (CRESTOR) 20 MG tablet Take 20 mg by mouth daily.     No current facility-administered medications for this visit.     Abtx:  Anti-infectives (From admission, onward)    None       REVIEW OF SYSTEMS:  Const: negative fever, negative chills, negative weight loss Eyes: negative diplopia or visual changes, negative eye pain ENT: negative coryza, negative sore throat Resp: negative cough, hemoptysis, dyspnea Cards: negative for chest pain, palpitations, lower extremity edema GU: negative for frequency, dysuria and hematuria GI: Negative for abdominal pain, diarrhea, bleeding, constipation Skin: negative for rash and pruritus Heme: negative for easy bruising and gum/nose bleeding MS: negative for myalgias, arthralgias, back pain and muscle  weakness Neurolo:negative for headaches, dizziness, vertigo, memory problems  Psych: negative for feelings of anxiety, depression  Endocrine:  diabetes Allergy/Immunology- negative for any medication or food allergies ? Objective:  VITALS:  BP (!) 161/89   Pulse 67   Temp 98 F (36.7 C)   Resp 16   Ht 6' (1.829 m)   Wt 190 lb (86.2 kg)   SpO2 97%   BMI 25.77 kg/m  PHYSICAL EXAM:  General: Alert, cooperative, no distress, appears stated age.  Head: Normocephalic, without obvious abnormality, atraumatic. Eyes: Conjunctivae clear, anicteric sclerae. Pupils are equal ENT Nares normal. No drainage or sinus tenderness. Lips, mucosa, and tongue normal. No Thrush Neck: Supple, symmetrical, no adenopathy, thyroid: non tender no carotid bruit and no JVD. Back: No CVA tenderness. Lungs: Clear to auscultation bilaterally. No Wheezing or Rhonchi. No rales. Heart: Regular rate and rhythm, no murmur, rub or gallop. Abdomen: Soft, non-tender,not distended. Bowel sounds normal. No masses Extremities: left foot- edematous- toes swollen - 3rd and 1st the worst- wound on the tip of the toes     Skin: No rashes or lesions. Or bruising Lymph: Cervical, supraclavicular normal. Neurologic: Grossly non-focal Pertinent Labs Lab Results CBC    Component Value Date/Time   WBC 11.0 (H) 01/16/2021 0139   RBC 4.66 01/16/2021 0139   HGB 14.1 01/16/2021 0139   HCT 41.6 01/16/2021 0139   PLT 206 01/16/2021 0139   MCV 89.3 01/16/2021 0139   MCH 30.3 01/16/2021 0139   MCHC 33.9 01/16/2021 0139   RDW 13.2 01/16/2021 0139    CMP Latest Ref Rng & Units 01/17/2021 01/16/2021 01/15/2021  Glucose 70 - 99 mg/dL - 269(S) 854(O)  BUN 8 - 23 mg/dL - 14 14  Creatinine 2.70 - 1.24 mg/dL 3.50 0.93 8.18  Sodium 135 - 145 mmol/L - 140 139  Potassium 3.5 - 5.1 mmol/L - 3.5 3.9  Chloride 98 - 111 mmol/L - 107 105  CO2 22 - 32 mmol/L - 28 25  Calcium 8.9 - 10.3 mg/dL - 8.7(L) 9.1       Microbiology:  01/21/21- culture Acinetobacter awofi and MSSA  IMAGING RESULTS: Osteomyelitis of the first distal and proximal phalanges as well as the third distal phalanx. No abscess MRI foot reviewed by me  ? Impression/Recommendation ? 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 Culture positive for staph aureus and acinetobacter Pt would like to avoid IV antibiotics or surgery for the next couple of months as he does not want to take off from the new job and is also moving to a new home So will do augmentin 875mg  PO BID until then- HE will see D.Baker next week and I will discuss with him Also took another  cutture from the third toe He may end up with surgery and Iv antibiotics in the near future  DM- on insulin and oral antidiabetics   Discussed the management with the patient in  detail  ?will follow him PRN ___________________________________________________ Discussed with patient, requesting provider Note:  This document was prepared using Dragon voice recognition software and may include unintentional dictation errors.

## 2021-02-04 NOTE — Patient Instructions (Addendum)
You are here for left foot infection. The culture taken on 01/21/21 had staph aureus, acinetobacter- you are currently  on Doxy- will change to augmentin- as you prefer to have oral antibiotics because of work and moving- we will strive to do that- Today I have taken culture of your third toe and will discuss with Dr.Baker.

## 2021-02-06 LAB — AEROBIC CULTURE W GRAM STAIN (SUPERFICIAL SPECIMEN)

## 2021-02-07 ENCOUNTER — Telehealth: Payer: Self-pay

## 2021-02-07 NOTE — Telephone Encounter (Signed)
Patient left voicemail stating he has an appointment with Dr. Excell Seltzer on 02/11/21 at 8:45 am and that Dr. Rivka Safer wanted to join. Will route to provider.   Sandie Ano, RN

## 2021-02-11 ENCOUNTER — Telehealth: Payer: Self-pay

## 2021-02-11 NOTE — Telephone Encounter (Signed)
Advised per Dr Rivka Safer that she spoke to Dr Excell Seltzer and at this time there is no surgery or IV abx planned. Patient should remain on Augmentin for 4-6 weeks. He was given abx on 02/04/21 28 pills and 1 refill but can call if he needs more. Patient verbally expressed understanding and gratitude.

## 2021-03-03 MED ORDER — AMOXICILLIN-POT CLAVULANATE 875-125 MG PO TABS: 1.0000 | ORAL_TABLET | Freq: Two times a day (BID) | ORAL | 1 refills | Status: DC

## 2021-06-18 LAB — COLOGUARD: COLOGUARD: NEGATIVE

## 2021-07-04 ENCOUNTER — Other Ambulatory Visit: Payer: Self-pay

## 2021-07-04 ENCOUNTER — Inpatient Hospital Stay: Payer: PRIVATE HEALTH INSURANCE

## 2021-07-04 ENCOUNTER — Inpatient Hospital Stay
Admission: EM | Admit: 2021-07-04 | Discharge: 2021-07-07 | DRG: 617 | Disposition: A | Discharge status: Home / Self Care | Payer: PRIVATE HEALTH INSURANCE | Attending: Family Medicine | Admitting: Family Medicine

## 2021-07-04 ENCOUNTER — Encounter: Payer: Self-pay | Admitting: Emergency Medicine

## 2021-07-04 ENCOUNTER — Emergency Department: Payer: PRIVATE HEALTH INSURANCE

## 2021-07-04 DIAGNOSIS — Z20822 Contact with and (suspected) exposure to covid-19: Secondary | ICD-10-CM | POA: Diagnosis present

## 2021-07-04 DIAGNOSIS — Z9181 History of falling: Secondary | ICD-10-CM

## 2021-07-04 DIAGNOSIS — E785 Hyperlipidemia, unspecified: Secondary | ICD-10-CM | POA: Diagnosis present

## 2021-07-04 DIAGNOSIS — E1169 Type 2 diabetes mellitus with other specified complication: Principal | ICD-10-CM | POA: Diagnosis present

## 2021-07-04 DIAGNOSIS — E1140 Type 2 diabetes mellitus with diabetic neuropathy, unspecified: Secondary | ICD-10-CM | POA: Diagnosis present

## 2021-07-04 DIAGNOSIS — Z7982 Long term (current) use of aspirin: Secondary | ICD-10-CM | POA: Diagnosis not present

## 2021-07-04 DIAGNOSIS — Z7984 Long term (current) use of oral hypoglycemic drugs: Secondary | ICD-10-CM | POA: Diagnosis not present

## 2021-07-04 DIAGNOSIS — Z23 Encounter for immunization: Secondary | ICD-10-CM | POA: Diagnosis not present

## 2021-07-04 DIAGNOSIS — Z79899 Other long term (current) drug therapy: Secondary | ICD-10-CM | POA: Diagnosis not present

## 2021-07-04 DIAGNOSIS — X501XXA Overexertion from prolonged static or awkward postures, initial encounter: Secondary | ICD-10-CM | POA: Diagnosis not present

## 2021-07-04 DIAGNOSIS — E11621 Type 2 diabetes mellitus with foot ulcer: Secondary | ICD-10-CM | POA: Diagnosis present

## 2021-07-04 DIAGNOSIS — L97519 Non-pressure chronic ulcer of other part of right foot with unspecified severity: Secondary | ICD-10-CM | POA: Diagnosis present

## 2021-07-04 DIAGNOSIS — S99921A Unspecified injury of right foot, initial encounter: Secondary | ICD-10-CM | POA: Diagnosis present

## 2021-07-04 DIAGNOSIS — M869 Osteomyelitis, unspecified: Secondary | ICD-10-CM | POA: Diagnosis present

## 2021-07-04 DIAGNOSIS — E119 Type 2 diabetes mellitus without complications: Secondary | ICD-10-CM | POA: Diagnosis not present

## 2021-07-04 DIAGNOSIS — I10 Essential (primary) hypertension: Secondary | ICD-10-CM | POA: Diagnosis present

## 2021-07-04 DIAGNOSIS — Z833 Family history of diabetes mellitus: Secondary | ICD-10-CM | POA: Diagnosis not present

## 2021-07-04 DIAGNOSIS — Z79890 Hormone replacement therapy (postmenopausal): Secondary | ICD-10-CM | POA: Diagnosis not present

## 2021-07-04 DIAGNOSIS — L03031 Cellulitis of right toe: Secondary | ICD-10-CM | POA: Diagnosis present

## 2021-07-04 DIAGNOSIS — E039 Hypothyroidism, unspecified: Secondary | ICD-10-CM | POA: Diagnosis present

## 2021-07-04 DIAGNOSIS — M7989 Other specified soft tissue disorders: Secondary | ICD-10-CM | POA: Diagnosis present

## 2021-07-04 LAB — BASIC METABOLIC PANEL
Anion gap: 8 (ref 5–15)
BUN: 17 mg/dL (ref 8–23)
CO2: 27 mmol/L (ref 22–32)
Calcium: 9.1 mg/dL (ref 8.9–10.3)
Chloride: 101 mmol/L (ref 98–111)
Creatinine, Ser: 0.72 mg/dL (ref 0.61–1.24)
GFR, Estimated: >60 mL/min (ref >60)
Glucose, Bld: 173 mg/dL — ABNORMAL HIGH (ref 70–99)
Potassium: 3.5 mmol/L (ref 3.5–5.1)
Sodium: 136 mmol/L (ref 135–145)

## 2021-07-04 LAB — RESP PANEL BY RT-PCR (FLU A&B, COVID) ARPGX2
Influenza A by PCR: NEGATIVE
Influenza B by PCR: NEGATIVE
SARS Coronavirus 2 by RT PCR: NEGATIVE

## 2021-07-04 LAB — CBC
HCT: 45.6 % (ref 39.0–52.0)
Hemoglobin: 15.1 g/dL (ref 13.0–17.0)
MCH: 29 pg (ref 26.0–34.0)
MCHC: 33.1 g/dL (ref 30.0–36.0)
MCV: 87.7 fL (ref 80.0–100.0)
Platelets: 331 10*3/uL (ref 150–400)
RBC: 5.2 MIL/uL (ref 4.22–5.81)
RDW: 13 % (ref 11.5–15.5)
WBC: 12.5 10*3/uL — ABNORMAL HIGH (ref 4.0–10.5)
nRBC: 0 % (ref 0.0–0.2)

## 2021-07-04 LAB — PROTIME-INR
INR: 1 (ref 0.8–1.2)
Prothrombin Time: 13.3 seconds (ref 11.4–15.2)

## 2021-07-04 LAB — GLUCOSE, CAPILLARY: Glucose-Capillary: 145 mg/dL — ABNORMAL HIGH (ref 70–99)

## 2021-07-04 LAB — APTT: aPTT: 28 seconds (ref 24–36)

## 2021-07-04 LAB — SEDIMENTATION RATE: Sed Rate: 27 mm/hr — ABNORMAL HIGH (ref 0–20)

## 2021-07-04 LAB — CBG MONITORING, ED: Glucose-Capillary: 189 mg/dL — ABNORMAL HIGH (ref 70–99)

## 2021-07-04 LAB — LACTIC ACID, PLASMA: Lactic Acid, Venous: 1.3 mmol/L (ref 0.5–1.9)

## 2021-07-04 MED ORDER — VANCOMYCIN HCL IN DEXTROSE 1-5 GM/200ML-% IV SOLN
1000.0000 mg | Freq: Once | INTRAVENOUS | Status: AC
Start: 2021-07-04 — End: 2021-07-04
  Administered 2021-07-04: 1000 mg via INTRAVENOUS
  Filled 2021-07-04: qty 200

## 2021-07-04 MED ORDER — METRONIDAZOLE 500 MG/100ML IV SOLN
500.0000 mg | Freq: Two times a day (BID) | INTRAVENOUS | Status: DC
  Administered 2021-07-04 – 2021-07-07 (×6): 500 mg via INTRAVENOUS
  Filled 2021-07-04 (×7): qty 100

## 2021-07-04 MED ORDER — SODIUM CHLORIDE 0.9 % IV SOLN: INTRAVENOUS | Status: DC

## 2021-07-04 MED ORDER — ONDANSETRON HCL 4 MG/2ML IJ SOLN: 4.0000 mg | Freq: Three times a day (TID) | INTRAMUSCULAR | Status: DC | PRN

## 2021-07-04 MED ORDER — ACETAMINOPHEN 325 MG PO TABS
650.0000 mg | ORAL_TABLET | Freq: Four times a day (QID) | ORAL | Status: DC | PRN
  Administered 2021-07-05: 650 mg via ORAL
  Filled 2021-07-04: qty 2

## 2021-07-04 MED ORDER — ATENOLOL 25 MG PO TABS
25.0000 mg | ORAL_TABLET | Freq: Every day | ORAL | Status: DC
  Administered 2021-07-05: 25 mg via ORAL
  Filled 2021-07-04 (×3): qty 1

## 2021-07-04 MED ORDER — HYDRALAZINE HCL 20 MG/ML IJ SOLN: 5.0000 mg | INTRAMUSCULAR | Status: DC | PRN

## 2021-07-04 MED ORDER — ASPIRIN EC 81 MG PO TBEC
81.0000 mg | DELAYED_RELEASE_TABLET | Freq: Every day | ORAL | Status: DC
  Administered 2021-07-05 – 2021-07-07 (×3): 81 mg via ORAL
  Filled 2021-07-04 (×3): qty 1

## 2021-07-04 MED ORDER — SODIUM CHLORIDE 0.9 % IV SOLN
2.0000 g | INTRAVENOUS | Status: DC
  Administered 2021-07-04 – 2021-07-06 (×3): 2 g via INTRAVENOUS
  Filled 2021-07-04: qty 20
  Filled 2021-07-04 (×2): qty 2
  Filled 2021-07-04: qty 20

## 2021-07-04 MED ORDER — VANCOMYCIN HCL 1500 MG/300ML IV SOLN
1500.0000 mg | Freq: Two times a day (BID) | INTRAVENOUS | Status: DC
Start: 2021-07-05 — End: 2021-07-05
  Administered 2021-07-05: 1500 mg via INTRAVENOUS
  Filled 2021-07-04 (×2): qty 300

## 2021-07-04 MED ORDER — LEVOTHYROXINE SODIUM 25 MCG PO TABS
25.0000 ug | ORAL_TABLET | Freq: Every day | ORAL | Status: DC
  Administered 2021-07-05 – 2021-07-07 (×3): 25 ug via ORAL
  Filled 2021-07-04 (×3): qty 1

## 2021-07-04 MED ORDER — ROSUVASTATIN CALCIUM 10 MG PO TABS
20.0000 mg | ORAL_TABLET | Freq: Every day | ORAL | Status: DC
  Administered 2021-07-05 – 2021-07-07 (×3): 20 mg via ORAL
  Filled 2021-07-04 (×3): qty 2

## 2021-07-04 MED ORDER — PIPERACILLIN-TAZOBACTAM 3.375 G IVPB 30 MIN
3.3750 g | Freq: Once | INTRAVENOUS | Status: AC
  Administered 2021-07-04: 3.375 g via INTRAVENOUS
  Filled 2021-07-04: qty 50

## 2021-07-04 MED ORDER — BACITRACIN ZINC 500 UNIT/GM EX OINT
TOPICAL_OINTMENT | Freq: Every day | CUTANEOUS | Status: DC
  Administered 2021-07-04: 1 via TOPICAL
  Filled 2021-07-04 (×4): qty 0.9

## 2021-07-04 MED ORDER — INFLUENZA VAC SPLIT QUAD 0.5 ML IM SUSY
0.5000 mL | PREFILLED_SYRINGE | INTRAMUSCULAR | Status: AC
  Administered 2021-07-05: 0.5 mL via INTRAMUSCULAR
  Filled 2021-07-04: qty 0.5

## 2021-07-04 MED ORDER — INSULIN ASPART 100 UNIT/ML IJ SOLN
0.0000 [IU] | Freq: Three times a day (TID) | INTRAMUSCULAR | Status: DC
  Administered 2021-07-05 – 2021-07-06 (×2): 5 [IU] via SUBCUTANEOUS
  Administered 2021-07-06: 17:00:00 3 [IU] via SUBCUTANEOUS
  Administered 2021-07-06: 09:00:00 1 [IU] via SUBCUTANEOUS
  Administered 2021-07-07: 12:00:00 3 [IU] via SUBCUTANEOUS
  Administered 2021-07-07: 08:00:00 2 [IU] via SUBCUTANEOUS
  Filled 2021-07-04 (×6): qty 1

## 2021-07-04 MED ORDER — VITAMIN D 25 MCG (1000 UNIT) PO TABS
1000.0000 [IU] | ORAL_TABLET | Freq: Every day | ORAL | Status: DC
  Administered 2021-07-05 – 2021-07-07 (×3): 1000 [IU] via ORAL
  Filled 2021-07-04 (×3): qty 1

## 2021-07-04 MED ORDER — INSULIN ASPART 100 UNIT/ML IJ SOLN
0.0000 [IU] | Freq: Every day | INTRAMUSCULAR | Status: DC
  Administered 2021-07-06: 21:00:00 3 [IU] via SUBCUTANEOUS
  Filled 2021-07-04: qty 1

## 2021-07-04 MED ORDER — HEPARIN SODIUM (PORCINE) 5000 UNIT/ML IJ SOLN
5000.0000 [IU] | Freq: Three times a day (TID) | INTRAMUSCULAR | Status: DC
  Administered 2021-07-05 – 2021-07-07 (×5): 5000 [IU] via SUBCUTANEOUS
  Filled 2021-07-04 (×7): qty 1

## 2021-07-04 NOTE — Progress Notes (Signed)
PHARMACY -  BRIEF ANTIBIOTIC NOTE   Pharmacy has received consult(s) for vancomycin from an ED provider.  The patient's profile has been reviewed for ht/wt/allergies/indication/available labs.    One time order(s) placed for Vancomycin 2 g  Further antibiotics/pharmacy consults should be ordered by admitting physician if indicated.                       Thank you, Robyne Peers Antoinetta Berrones 07/04/2021  11:06 AM

## 2021-07-04 NOTE — Progress Notes (Signed)
Pharmacy Antibiotic Note  Alexander Gill is a 65 y.o. male admitted on 07/04/2021. Noted that patient was seen by Dr. Excell Seltzer in podiatry yesterday (12/15).  At that time they wanted patient to be admitted for IV antibiotics likely surgical /debridement and potential amputation. Pharmacy has been consulted for vancomycin dosing for R foot osteomyelitis.  Plan: Pt to received vancomycin 2 g IV x 1 loading dose in ED. Will continue with vancomycin 1500 mg q12h Est AUC: 526 Used: Scr 0.8 (rounded up), IBW, Vd 0.72 Obtain vanc levels around 4th or 5th dose if continued Monitor renal function and adjust dose as clinically indicated  Height: 6' (182.9 cm) Weight: 88.5 kg (195 lb) IBW/kg (Calculated) : 77.6  Temp (24hrs), Avg:98.4 F (36.9 C), Min:98.3 F (36.8 C), Max:98.4 F (36.9 C)  Recent Labs  Lab 07/04/21 0812  WBC 12.5*  CREATININE 0.72  LATICACIDVEN 1.3    Estimated Creatinine Clearance: 102.4 mL/min (by C-G formula based on SCr of 0.72 mg/dL).    No Known Allergies  Antimicrobials this admission: 12/16 Zosyn x1 12/16 Metronidazole >>  12/16 Ceftriaxone >>  12/16 Vancomycin >>    Microbiology results: 12/16 BCx: sent  Thank you for allowing pharmacy to be a part of this patients care.  [name] 07/04/2021 1:21 PM

## 2021-07-04 NOTE — ED Notes (Signed)
Crystal RN aware of assigned bed 

## 2021-07-04 NOTE — Consult Note (Signed)
Reason for Consult: Osteomyelitis right fourth toe. Referring Physician: Benjimin Hadden is an 65 y.o. male.  HPI: This is a 65 year old male with history of some ulcerations on toes on his right foot.  States that a couple of days ago he sustained an ankle injury where he twisted the ankle and that is when he noticed the redness and swelling in his fourth toe.  Previously had been on some antibiotics few weeks ago.  Relates some chronic sores.  Due to the extent of the infection patient was referred to the hospital for admission as well as the need for most likely amputation of his fourth toe.  Past Medical History:  Diagnosis Date   Cataract    bilateral eyes   Diabetes mellitus without complication (HCC)    Heart abnormality    Enlarged left ventricle   Hypertension     Past Surgical History:  Procedure Laterality Date   HERNIA REPAIR      Family History  Problem Relation Age of Onset   Diabetes Mother    Diabetes Father    Parkinson's disease Brother     Social History:  reports that he has never smoked. He has never used smokeless tobacco. He reports that he does not currently use alcohol. He reports that he does not currently use drugs.  Allergies: No Known Allergies  Medications: Scheduled:  [START ON 07/05/2021] aspirin EC  81 mg Oral Daily   [START ON 07/05/2021] atenolol  25 mg Oral Daily   [START ON 07/05/2021] cholecalciferol  1,000 Units Oral Daily   heparin  5,000 Units Subcutaneous Q8H   insulin aspart  0-5 Units Subcutaneous QHS   insulin aspart  0-9 Units Subcutaneous TID WC   [START ON 07/05/2021] levothyroxine  25 mcg Oral Q0600   rosuvastatin  20 mg Oral Daily    Results for orders placed or performed during the hospital encounter of 07/04/21 (from the past 48 hour(s))  CBC     Status: Abnormal   Collection Time: 07/04/21  8:12 AM  Result Value Ref Range   WBC 12.5 (H) 4.0 - 10.5 K/uL   RBC 5.20 4.22 - 5.81 MIL/uL   Hemoglobin 15.1 13.0 - 17.0  g/dL   HCT 57.8 46.9 - 62.9 %   MCV 87.7 80.0 - 100.0 fL   MCH 29.0 26.0 - 34.0 pg   MCHC 33.1 30.0 - 36.0 g/dL   RDW 52.8 41.3 - 24.4 %   Platelets 331 150 - 400 K/uL   nRBC 0.0 0.0 - 0.2 %    Comment: Performed at [facility], [address] [place], [place] [address]  Basic metabolic panel     Status: Abnormal   Collection Time: 07/04/21  8:12 AM  Result Value Ref Range   Sodium 136 135 - 145 mmol/L   Potassium 3.5 3.5 - 5.1 mmol/L   Chloride 101 98 - 111 mmol/L   CO2 27 22 - 32 mmol/L   Glucose, Bld 173 (H) 70 - 99 mg/dL    Comment: Glucose reference range applies only to samples taken after fasting for at least 8 hours.   BUN 17 8 - 23 mg/dL   Creatinine, Ser 2.53 0.61 - 1.24 mg/dL   Calcium 9.1 8.9 - 66.4 mg/dL   GFR, Estimated >40 >34 mL/min    Comment: (NOTE) Calculated using the CKD-EPI Creatinine Equation (2021)    Anion gap 8 5 - 15    Comment: Performed at [facility],  [address] [place], [place] [address]  Lactic acid, plasma     Status: None   Collection Time: 07/04/21  8:12 AM  Result Value Ref Range   Lactic Acid, Venous 1.3 0.5 - 1.9 mmol/L    Comment: Performed at [facility], [address] [place], [place] [address]  Sedimentation rate     Status: Abnormal   Collection Time: 07/04/21  8:12 AM  Result Value Ref Range   Sed Rate 27 (H) 0 - 20 mm/hr    Comment: Performed at [facility], [address], [place], [place] [address]  Resp Panel by RT-PCR (Flu A&B, Covid) Nasopharyngeal Swab     Status: None   Collection Time: 07/04/21 12:25 PM   Specimen: Nasopharyngeal Swab; Nasopharyngeal(NP) swabs in vial transport medium  Result Value Ref Range   SARS Coronavirus 2 by RT PCR NEGATIVE NEGATIVE    Comment: (NOTE) SARS-CoV-2 target nucleic acids are NOT DETECTED.  The SARS-CoV-2 RNA is generally detectable in upper respiratory specimens during the acute phase of infection. The lowest concentration of  SARS-CoV-2 viral copies this assay can detect is 138 copies/mL. A negative result does not preclude SARS-Cov-2 infection and should not be used as the sole basis for treatment or other patient management decisions. A negative result may occur with  improper specimen collection/handling, submission of specimen other than nasopharyngeal swab, presence of viral mutation(s) within the areas targeted by this assay, and inadequate number of viral copies(<138 copies/mL). A negative result must be combined with clinical observations, patient history, and epidemiological information. The expected result is Negative.  Fact Sheet for Patients:  BloggerCourse.com  Fact Sheet for Healthcare Providers:  SeriousBroker.it  This test is no t yet approved or cleared by the [place] FDA and  has been authorized for detection and/or diagnosis of SARS-CoV-2 by FDA under an Emergency Use Authorization (EUA). This EUA will remain  in effect (meaning this test can be used) for the duration of the COVID-19 declaration under Section 564(b)(1) of the Act, 21 U.S.C.section 360bbb-3(b)(1), unless the authorization is terminated  or revoked sooner.       Influenza A by PCR NEGATIVE NEGATIVE   Influenza B by PCR NEGATIVE NEGATIVE    Comment: (NOTE) The Xpert Xpress SARS-CoV-2/FLU/RSV plus assay is intended as an aid in the diagnosis of influenza from Nasopharyngeal swab specimens and should not be used as a sole basis for treatment. Nasal washings and aspirates are unacceptable for Xpert Xpress SARS-CoV-2/FLU/RSV testing.  Fact Sheet for Patients: BloggerCourse.com  Fact Sheet for Healthcare Providers: SeriousBroker.it  This test is not yet approved or cleared by the [place] FDA and has been authorized for detection and/or diagnosis of SARS-CoV-2 by FDA under an Emergency Use Authorization (EUA).  This EUA will remain in effect (meaning this test can be used) for the duration of the COVID-19 declaration under Section 564(b)(1) of the Act, 21 U.S.C. section 360bbb-3(b)(1), unless the authorization is terminated or revoked.  Performed at [facility], [address] [place], [place] [address]     MR FOOT RIGHT WO CONTRAST  Result Date: 07/04/2021 CLINICAL DATA:  Osteomyelitis, foot. Fourth toe swelling. History of diabetes and neuropathy. Ankle twisting injury 2 days ago. EXAM: MRI OF THE RIGHT FOREFOOT WITHOUT CONTRAST TECHNIQUE: Multiplanar, multisequence MR imaging of the right forefoot was performed. No intravenous contrast was administered. COMPARISON:  Foot radiographs 07/04/2021. Left forefoot MRI 01/16/2021. FINDINGS: Bones/Joint/Cartilage As seen on prior radiographs, there is cortical destruction  of the middle and distal phalanges of the 4th toe surrounding the distal interphalangeal joint, highly suspicious for osteomyelitis. There is associated decreased T1 and increased T2 marrow signal, and interphalangeal joint effusion and an apparent sinus tract extending dorsally to the skin. Mild T2 hyperintensity is also present within the head of the proximal phalanx. There are less specific marrow changes within the proximal and middle phalanges of the 2nd toe, primarily on the T2 weighted images, but also involving the head of the proximal phalanx on the T1 weighted images. The proximal and distal phalanges of the great toe are ankylosed. The metatarsals and visualized bones of the midfoot appear unremarkable. Ligaments The Lisfranc ligament is intact. The collateral ligaments of the metatarsophalangeal joints are intact. Muscles and Tendons Marked tendinosis of the flexor hallucis longus tendon without focal tear. The additional forefoot tendons appear intact. Probable extensor tenosynovitis within the 4th toe. Soft tissues As above, skin ulceration and a probable sinus tract  dorsally in the 4th toe, extending to the underlying osseous abnormalities described above. No other sinus tract or focal fluid collections are identified. IMPRESSION: 1. As seen on prior radiographs, there are findings in the 4th toe which are consistent with osteomyelitis. There is cortical destruction of the middle and distal phalanges as well as mild T2 hyperintensity within the head of the proximal phalanx. Associated dorsal skin ulceration and small sinus tract. 2. Less specific marrow changes within the proximal and middle phalanges of the 2nd toe, also potentially osteomyelitis, but without apparent confirmatory adjacent soft tissue abnormality. 3. Ankylosed interphalangeal joint of the great toe. Underlying flexor hallucis longus tendinosis. Electronically Signed   By: William  Veazey M.D.   On: 07/04/2021 13:55  ° °DG Foot Complete Right ° °Result Date: 07/04/2021 °CLINICAL DATA:  Right foot wound. EXAM: RIGHT FOOT COMPLETE - 3+ VIEW COMPARISON:  None. FINDINGS: Soft tissue swelling is seen involving the fourth toe concerning for cellulitis. Ulceration is seen involving the distal portion of the fourth toe. Lytic destruction is seen involving portions of the fourth middle and distal phalanges concerning for osteomyelitis. No other bony abnormality is noted. IMPRESSION: Ulceration is seen involving the fourth toe with underlying lytic destruction of the fourth middle and distal phalanges concerning for osteomyelitis. Electronically Signed   By: James  Green Jr M.D.   On: 07/04/2021 10:29   ° °Review of Systems  °Constitutional:  Negative for chills and fever.  °HENT:  Negative for sinus pain and sore throat.   °Respiratory:  Negative for cough and shortness of breath.   °Cardiovascular:  Negative for chest pain and palpitations.  °Gastrointestinal:  Negative for nausea and vomiting.  °Endocrine: Negative for polydipsia and polyuria.  °Genitourinary:  Negative for frequency and urgency.  °Musculoskeletal:    °     Patient relates recent injury a few days ago twisting his right ankle.  Has had some swelling.  °Skin:   °     Patient relates recent redness and swelling in his right for with chronic sores on his right second and fourth toes with some drainage.  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  °Psychiatric/Behavioral:  Negative for confusion. The patient is not nervous/anxious.   °Blood pressure 122/66, pulse 60, temperature 98.4 °F (36.9 °C), temperature source Oral, resp. rate 16, height 6' (1.829 m), weight 88.5 kg, SpO2 94 %. °Physical Exam °Cardiovascular:  °   Comments: DP and   PT pulses are palpable but mildly diminished. Musculoskeletal:     Comments: Adequate range of motion of the pedal joints.  Muscle testing deferred.  Some soreness but no instability around the lateral aspect of the right ankle.  Some hammertoe contractures noted on the lesser digits with hallux valgus deformity.  Skin:    Comments: Significant erythema and edema with full-thickness wound on the lateral aspect of the right fourth toe as well as a full-thickness ulceration on the medial aspect of the right second toe.  Probes down to the level of the bone on the fourth toe.  Neurological:     Comments: Loss of protective threshold with a monofilament wire in the forefoot and toes.    Assessment/Plan: Assessment: 1.  Definitive osteomyelitis right fourth toe. 2.  Probable osteomyelitis right second toe. 3.  Diabetes with associated neuropathy.  Plan: Discussed with the patient that due to the extent of the infection in his right fourth toe that I recommend a fairly urgent amputation of the fourth toe.  Also discussed that he may have infection in the second toe but this is not as acute.  Patient very hesitant to go ahead and agree to amputation at this point.  States he would like to think about this for another 24 hours as well as consult with his  wife.  Discussed with the patient at length that the longer he waits the more chance there is for the infection to spread further up into the foot which may require more extensive debridement and/or amputation.  Patient voiced understanding of this.  At this point I will plan to follow-up with the patient tomorrow morning to again discuss the need for at least amputation of the right fourth toe.  Ricci Barker 07/04/2021, 2:41 PM

## 2021-07-04 NOTE — H&P (View-Only) (Signed)
Reason for Consult: Osteomyelitis right fourth toe. Referring Physician: [name] is an 65 y.o. male.  HPI: This is a 65 year old male with history of some ulcerations on toes on his right foot.  States that a couple of days ago he sustained an ankle injury where he twisted the ankle and that is when he noticed the redness and swelling in his fourth toe.  Previously had been on some antibiotics few weeks ago.  Relates some chronic sores.  Due to the extent of the infection patient was referred to the hospital for admission as well as the need for most likely amputation of his fourth toe.  Past Medical History:  Diagnosis Date   Cataract    bilateral eyes   Diabetes mellitus without complication (HCC)    Heart abnormality    Enlarged left ventricle   Hypertension     Past Surgical History:  Procedure Laterality Date   HERNIA REPAIR      Family History  Problem Relation Age of Onset   Diabetes Mother    Diabetes Father    Parkinson's disease Brother     Social History:  reports that he has never smoked. He has never used smokeless tobacco. He reports that he does not currently use alcohol. He reports that he does not currently use drugs.  Allergies: No Known Allergies  Medications: Scheduled:  [START ON 07/05/2021] aspirin EC  81 mg Oral Daily   [START ON 07/05/2021] atenolol  25 mg Oral Daily   [START ON 07/05/2021] cholecalciferol  1,000 Units Oral Daily   heparin  5,000 Units Subcutaneous Q8H   insulin aspart  0-5 Units Subcutaneous QHS   insulin aspart  0-9 Units Subcutaneous TID WC   [START ON 07/05/2021] levothyroxine  25 mcg Oral Q0600   rosuvastatin  20 mg Oral Daily    Results for orders placed or performed during the hospital encounter of 07/04/21 (from the past 48 hour(s))  CBC     Status: Abnormal   Collection Time: 07/04/21  8:12 AM  Result Value Ref Range   WBC 12.5 (H) 4.0 - 10.5 K/uL   RBC 5.20 4.22 - 5.81 MIL/uL   Hemoglobin 15.1 13.0 - 17.0  g/dL   HCT 57.8 46.9 - 62.9 %   MCV 87.7 80.0 - 100.0 fL   MCH 29.0 26.0 - 34.0 pg   MCHC 33.1 30.0 - 36.0 g/dL   RDW 52.8 41.3 - 24.4 %   Platelets 331 150 - 400 K/uL   nRBC 0.0 0.0 - 0.2 %    Comment: Performed at [facility], [address] [place], [place] [address]  Basic metabolic panel     Status: Abnormal   Collection Time: 07/04/21  8:12 AM  Result Value Ref Range   Sodium 136 135 - 145 mmol/L   Potassium 3.5 3.5 - 5.1 mmol/L   Chloride 101 98 - 111 mmol/L   CO2 27 22 - 32 mmol/L   Glucose, Bld 173 (H) 70 - 99 mg/dL    Comment: Glucose reference range applies only to samples taken after fasting for at least 8 hours.   BUN 17 8 - 23 mg/dL   Creatinine, Ser 2.53 0.61 - 1.24 mg/dL   Calcium 9.1 8.9 - 66.4 mg/dL   GFR, Estimated >40 >34 mL/min    Comment: (NOTE) Calculated using the CKD-EPI Creatinine Equation (2021)    Anion gap 8 5 - 15    Comment: Performed at [facility],  [address] [place], [place] [address]  Lactic acid, plasma     Status: None   Collection Time: 07/04/21  8:12 AM  Result Value Ref Range   Lactic Acid, Venous 1.3 0.5 - 1.9 mmol/L    Comment: Performed at [facility], [address] [place], [place] [address]  Sedimentation rate     Status: Abnormal   Collection Time: 07/04/21  8:12 AM  Result Value Ref Range   Sed Rate 27 (H) 0 - 20 mm/hr    Comment: Performed at [facility], [address], [place], [place] [address]  Resp Panel by RT-PCR (Flu A&B, Covid) Nasopharyngeal Swab     Status: None   Collection Time: 07/04/21 12:25 PM   Specimen: Nasopharyngeal Swab; Nasopharyngeal(NP) swabs in vial transport medium  Result Value Ref Range   SARS Coronavirus 2 by RT PCR NEGATIVE NEGATIVE    Comment: (NOTE) SARS-CoV-2 target nucleic acids are NOT DETECTED.  The SARS-CoV-2 RNA is generally detectable in upper respiratory specimens during the acute phase of infection. The lowest concentration of  SARS-CoV-2 viral copies this assay can detect is 138 copies/mL. A negative result does not preclude SARS-Cov-2 infection and should not be used as the sole basis for treatment or other patient management decisions. A negative result may occur with  improper specimen collection/handling, submission of specimen other than nasopharyngeal swab, presence of viral mutation(s) within the areas targeted by this assay, and inadequate number of viral copies(<138 copies/mL). A negative result must be combined with clinical observations, patient history, and epidemiological information. The expected result is Negative.  Fact Sheet for Patients:  BloggerCourse.com  Fact Sheet for Healthcare Providers:  SeriousBroker.it  This test is no t yet approved or cleared by the [place] FDA and  has been authorized for detection and/or diagnosis of SARS-CoV-2 by FDA under an Emergency Use Authorization (EUA). This EUA will remain  in effect (meaning this test can be used) for the duration of the COVID-19 declaration under Section 564(b)(1) of the Act, 21 U.S.C.section 360bbb-3(b)(1), unless the authorization is terminated  or revoked sooner.       Influenza A by PCR NEGATIVE NEGATIVE   Influenza B by PCR NEGATIVE NEGATIVE    Comment: (NOTE) The Xpert Xpress SARS-CoV-2/FLU/RSV plus assay is intended as an aid in the diagnosis of influenza from Nasopharyngeal swab specimens and should not be used as a sole basis for treatment. Nasal washings and aspirates are unacceptable for Xpert Xpress SARS-CoV-2/FLU/RSV testing.  Fact Sheet for Patients: BloggerCourse.com  Fact Sheet for Healthcare Providers: SeriousBroker.it  This test is not yet approved or cleared by the [place] FDA and has been authorized for detection and/or diagnosis of SARS-CoV-2 by FDA under an Emergency Use Authorization (EUA).  This EUA will remain in effect (meaning this test can be used) for the duration of the COVID-19 declaration under Section 564(b)(1) of the Act, 21 U.S.C. section 360bbb-3(b)(1), unless the authorization is terminated or revoked.  Performed at [facility], [address] [place], [place] [address]     MR FOOT RIGHT WO CONTRAST  Result Date: 07/04/2021 CLINICAL DATA:  Osteomyelitis, foot. Fourth toe swelling. History of diabetes and neuropathy. Ankle twisting injury 2 days ago. EXAM: MRI OF THE RIGHT FOREFOOT WITHOUT CONTRAST TECHNIQUE: Multiplanar, multisequence MR imaging of the right forefoot was performed. No intravenous contrast was administered. COMPARISON:  Foot radiographs 07/04/2021. Left forefoot MRI 01/16/2021. FINDINGS: Bones/Joint/Cartilage As seen on prior radiographs, there is cortical destruction  of the middle and distal phalanges of the 4th toe surrounding the distal interphalangeal joint, highly suspicious for osteomyelitis. There is associated decreased T1 and increased T2 marrow signal, and interphalangeal joint effusion and an apparent sinus tract extending dorsally to the skin. Mild T2 hyperintensity is also present within the head of the proximal phalanx. There are less specific marrow changes within the proximal and middle phalanges of the 2nd toe, primarily on the T2 weighted images, but also involving the head of the proximal phalanx on the T1 weighted images. The proximal and distal phalanges of the great toe are ankylosed. The metatarsals and visualized bones of the midfoot appear unremarkable. Ligaments The Lisfranc ligament is intact. The collateral ligaments of the metatarsophalangeal joints are intact. Muscles and Tendons Marked tendinosis of the flexor hallucis longus tendon without focal tear. The additional forefoot tendons appear intact. Probable extensor tenosynovitis within the 4th toe. Soft tissues As above, skin ulceration and a probable sinus tract  dorsally in the 4th toe, extending to the underlying osseous abnormalities described above. No other sinus tract or focal fluid collections are identified. IMPRESSION: 1. As seen on prior radiographs, there are findings in the 4th toe which are consistent with osteomyelitis. There is cortical destruction of the middle and distal phalanges as well as mild T2 hyperintensity within the head of the proximal phalanx. Associated dorsal skin ulceration and small sinus tract. 2. Less specific marrow changes within the proximal and middle phalanges of the 2nd toe, also potentially osteomyelitis, but without apparent confirmatory adjacent soft tissue abnormality. 3. Ankylosed interphalangeal joint of the great toe. Underlying flexor hallucis longus tendinosis. Electronically Signed   By: Carey Bullocks M.D.   On: 07/04/2021 13:55   DG Foot Complete Right  Result Date: 07/04/2021 CLINICAL DATA:  Right foot wound. EXAM: RIGHT FOOT COMPLETE - 3+ VIEW COMPARISON:  None. FINDINGS: Soft tissue swelling is seen involving the fourth toe concerning for cellulitis. Ulceration is seen involving the distal portion of the fourth toe. Lytic destruction is seen involving portions of the fourth middle and distal phalanges concerning for osteomyelitis. No other bony abnormality is noted. IMPRESSION: Ulceration is seen involving the fourth toe with underlying lytic destruction of the fourth middle and distal phalanges concerning for osteomyelitis. Electronically Signed   By: Lupita Raider M.D.   On: 07/04/2021 10:29    Review of Systems  Constitutional:  Negative for chills and fever.  HENT:  Negative for sinus pain and sore throat.   Respiratory:  Negative for cough and shortness of breath.   Cardiovascular:  Negative for chest pain and palpitations.  Gastrointestinal:  Negative for nausea and vomiting.  Endocrine: Negative for polydipsia and polyuria.  Genitourinary:  Negative for frequency and urgency.  Musculoskeletal:         Patient relates recent injury a few days ago twisting his right ankle.  Has had some swelling.  Skin:        Patient relates recent redness and swelling in his right for with chronic sores on his right second and fourth toes with some drainage.  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  Psychiatric/Behavioral:  Negative for confusion. The patient is not nervous/anxious.   Blood pressure 122/66, pulse 60, temperature 98.4 F (36.9 C), temperature source Oral, resp. rate 16, height 6' (1.829 m), weight 88.5 kg, SpO2 94 %. Physical Exam Cardiovascular:     Comments: DP and  PT pulses are palpable but mildly diminished. Musculoskeletal:     Comments: Adequate range of motion of the pedal joints.  Muscle testing deferred.  Some soreness but no instability around the lateral aspect of the right ankle.  Some hammertoe contractures noted on the lesser digits with hallux valgus deformity.  Skin:    Comments: Significant erythema and edema with full-thickness wound on the lateral aspect of the right fourth toe as well as a full-thickness ulceration on the medial aspect of the right second toe.  Probes down to the level of the bone on the fourth toe.  Neurological:     Comments: Loss of protective threshold with a monofilament wire in the forefoot and toes.    Assessment/Plan: Assessment: 1.  Definitive osteomyelitis right fourth toe. 2.  Probable osteomyelitis right second toe. 3.  Diabetes with associated neuropathy.  Plan: Discussed with the patient that due to the extent of the infection in his right fourth toe that I recommend a fairly urgent amputation of the fourth toe.  Also discussed that he may have infection in the second toe but this is not as acute.  Patient very hesitant to go ahead and agree to amputation at this point.  States he would like to think about this for another 24 hours as well as consult with his  wife.  Discussed with the patient at length that the longer he waits the more chance there is for the infection to spread further up into the foot which may require more extensive debridement and/or amputation.  Patient voiced understanding of this.  At this point I will plan to follow-up with the patient tomorrow morning to again discuss the need for at least amputation of the right fourth toe.  Alexander Gill 07/04/2021, 2:41 PM

## 2021-07-04 NOTE — ED Notes (Signed)
Med message was sent for flagyl and crestor. Flagyl now at bedside. Provided emotional support to pt with dx bone infx and need to amputate toe. Provided water and diet ginger ale.

## 2021-07-04 NOTE — ED Triage Notes (Signed)
Pt via POV from home. Pt c/o R diabetic foot ulcer that he noticed on Wednesday. Pt is diabetic. Denies pain. Pt is A&Ox4 and NAD. Redness and swelling noted to the R foot and R 4th toe. No necrotizing tissues noted. Pt is A&Ox4 and NAD.

## 2021-07-04 NOTE — ED Notes (Signed)
Dr Graciela Husbands, podiatrist at bedside.

## 2021-07-04 NOTE — ED Provider Notes (Addendum)
[facility] Emergency Department Provider Note  ____________________________________________   Event Date/Time   First MD Initiated Contact with Patient 07/04/21 1036     (approximate)  I have reviewed the triage vital signs and the nursing notes.   HISTORY  Chief Complaint Wound Check    HPI Alexander Gill is a 65 y.o. male with diabetes who comes in with concern for foot infection.  Patient reports being on some antibiotics recently including Augmentin.  He states that the swelling and redness have come down.  Then a few days he noted that he thinks that he twisted it and that he noted some swelling in his toes again.  He denies any significant pain.  He states he still able to ambulate on it.  However he has had some redness on the front of the foot and reoccurring ulcer of the fourth toe this is been constant, nothing makes it better or worse   On review of records patient was seen by Dr. Excell Seltzer in podiatry yesterday.  At that time they wanted patient to be admitted for IV antibiotics likely surgical debridement and potential amputation.          Past Medical History:  Diagnosis Date   Cataract    bilateral eyes   Diabetes mellitus without complication (HCC)    Heart abnormality    Enlarged left ventricle   Hypertension     Patient Active Problem List   Diagnosis Date Noted   Toe osteomyelitis (HCC) 01/16/2021    Past Surgical History:  Procedure Laterality Date   HERNIA REPAIR      Prior to Admission medications   Medication Sig Start Date End Date Taking? Authorizing Provider  amoxicillin-clavulanate (AUGMENTIN) 875-125 MG tablet Take 1 tablet by mouth 2 (two) times daily. 03/03/21   Lynn Ito, MD  aspirin EC 81 MG tablet Take 81 mg by mouth daily. Swallow whole.    [provider]  atenolol (TENORMIN) 25 MG tablet Take 25 mg by mouth daily. 12/11/20   [provider]  cholecalciferol (VITAMIN D3) 25 MCG  (1000 UNIT) tablet Take 1,000 Units by mouth daily.    [provider]  Dapagliflozin-metFORMIN HCl ER (XIGDUO XR) 11-998 MG TB24 Take 1 tablet by mouth daily at 2 am.    [provider]  levothyroxine (SYNTHROID) 25 MCG tablet Take 25 mcg by mouth daily. 09/27/20   [provider]  repaglinide (PRANDIN) 1 MG tablet Take 1 mg by mouth daily. Take 30 minutes prior to dinner. 01/08/21   [provider]  rosuvastatin (CRESTOR) 20 MG tablet Take 20 mg by mouth daily. 10/16/20   [provider]  Insulin Glargine (BASAGLAR KWIKPEN) 100 UNIT/ML Inject 30 Units into the skin at bedtime. 01/08/21 01/17/21  [provider]  losartan-hydrochlorothiazide (HYZAAR) 100-25 MG tablet Take 1 tablet by mouth daily. Patient not taking: Reported on 01/16/2021 01/08/21 01/17/21  [provider]  metFORMIN (GLUCOPHAGE) 1000 MG tablet Take 1,000 mg by mouth 2 (two) times daily. 12/10/20 01/17/21  [provider]    Allergies Patient has no known allergies.  History reviewed. No pertinent family history.  Social History Social History   Tobacco Use   Smoking status: Never   Smokeless tobacco: Never  Substance Use Topics   Alcohol use: Not Currently   Drug use: Not Currently      Review of Systems Constitutional: No fever/chills Eyes: No visual changes. ENT: No sore throat. Cardiovascular: Denies chest pain. Respiratory: Denies  shortness of breath. Gastrointestinal: No abdominal pain.  No nausea, no vomiting.  No diarrhea.  No constipation. Genitourinary: Negative for dysuria. Musculoskeletal: Foot infection Skin: Negative for rash. Neurological: Negative for headaches, focal weakness or numbness. All other ROS negative ____________________________________________   PHYSICAL EXAM:  VITAL SIGNS: ED Triage Vitals  Enc Vitals Group     BP 07/04/21 0805 136/68     Pulse Rate 07/04/21 0805 61     Resp 07/04/21 0805 20     Temp 07/04/21  0805 98.3 F (36.8 C)     Temp Source 07/04/21 0805 Oral     SpO2 07/04/21 0805 97 %     Weight 07/04/21 0804 195 lb (88.5 kg)     Height 07/04/21 0804 6' (1.829 m)     Head Circumference --      Peak Flow --      Pain Score 07/04/21 0804 0     Pain Loc --      Pain Edu? --      Excl. in GC? --     Constitutional: Alert and oriented. Well appearing and in no acute distress. Eyes: Conjunctivae are normal. EOMI. Head: Atraumatic. Nose: No congestion/rhinnorhea. Mouth/Throat: Mucous membranes are moist.   Neck: No stridor. Trachea Midline. FROM Cardiovascular: Normal rate, regular rhythm. Grossly normal heart sounds.  Good peripheral circulation. Respiratory: Normal respiratory effort.  No retractions. Lungs CTAB. Gastrointestinal: Soft and nontender. No distention. No abdominal bruits.  Musculoskeletal: Ulcerative swelling noted to the fourth toe.  Some redness on the front of the foot.  2+ distal pulse.  No significant tenderness on the ankle.  Patient able to ambulate Neurologic:  Normal speech and language. No gross focal neurologic deficits are appreciated.  Skin:  Skin is warm, dry and intact. No rash noted. Psychiatric: Mood and affect are normal. Speech and behavior are normal. GU: Deferred   ____________________________________________   LABS (all labs ordered are listed, but only abnormal results are displayed)  Labs Reviewed  CBC - Abnormal; Notable for the following components:      Result Value   WBC 12.5 (*)    All other components within normal limits  BASIC METABOLIC PANEL - Abnormal; Notable for the following components:   Glucose, Bld 173 (*)    All other components within normal limits  LACTIC ACID, PLASMA  LACTIC ACID, PLASMA   ____________________________________________   RADIOLOGY Vela Prose, personally viewed and evaluated these images (plain radiographs) as part of my medical decision making, as well as reviewing the written report by the  radiologist.  ED MD interpretation: Concern for osteomyelitis of the fourth digit  Official radiology report(s): DG Foot Complete Right  Result Date: 07/04/2021 CLINICAL DATA:  Right foot wound. EXAM: RIGHT FOOT COMPLETE - 3+ VIEW COMPARISON:  None. FINDINGS: Soft tissue swelling is seen involving the fourth toe concerning for cellulitis. Ulceration is seen involving the distal portion of the fourth toe. Lytic destruction is seen involving portions of the fourth middle and distal phalanges concerning for osteomyelitis. No other bony abnormality is noted. IMPRESSION: Ulceration is seen involving the fourth toe with underlying lytic destruction of the fourth middle and distal phalanges concerning for osteomyelitis. Electronically Signed   By: Lupita Raider M.D.   On: 07/04/2021 10:29    ____________________________________________   PROCEDURES  Procedure(s) performed (including Critical Care):  Procedures   ____________________________________________   INITIAL IMPRESSION / ASSESSMENT AND PLAN / ED COURSE  Laquinn Shippy was evaluated in Emergency  Department on 07/04/2021 for the symptoms described in the history of present illness. He was evaluated in the context of the global COVID-19 pandemic, which necessitated consideration that the patient might be at risk for infection with the SARS-CoV-2 virus that causes COVID-19. Institutional protocols and algorithms that pertain to the evaluation of patients at risk for COVID-19 are in a state of rapid change based on information released by regulatory bodies including the CDC and federal and state organizations. These policies and algorithms were followed during the patient's care in the ED.    Patient is diabetic who comes in from foot infection from podiatry recommended admission for IV antibiotics.  Patient does not meet septic criteria but will get blood cultures, lactate.  Will start on broad-spectrum antibiotics.  X-ray ordered to evaluate  for osteomyelitis.  My exam is concerning for cellulitis and foot ulcer.  Labs ordered to evaluate for any evidence of DKA.   White count slightly elevated consistent with concern for infection.  No evidence of DKA.  X-ray ordered with concern for osteomyelitis.  Discussed with Dr. Alberteen Spindle from podiatry who recommends MRI to see how far back the osteo goes in case patient needs amputation.  I did discuss with the hospital team and will admit patient  EKG is normal sinus rate of 62, no ST elevation, no T wave versions, normal intervals      ____________________________________________   FINAL CLINICAL IMPRESSION(S) / ED DIAGNOSES   Final diagnoses:  Osteomyelitis of right foot, unspecified type (HCC)      MEDICATIONS GIVEN DURING THIS VISIT:  Medications  piperacillin-tazobactam (ZOSYN) IVPB 3.375 g (has no administration in time range)  vancomycin (VANCOCIN) IVPB 1000 mg/200 mL premix (has no administration in time range)    Followed by  vancomycin (VANCOCIN) IVPB 1000 mg/200 mL premix (has no administration in time range)  ondansetron (ZOFRAN) injection 4 mg (has no administration in time range)  acetaminophen (TYLENOL) tablet 650 mg (has no administration in time range)  hydrALAZINE (APRESOLINE) injection 5 mg (has no administration in time range)  0.9 %  sodium chloride infusion (has no administration in time range)  insulin aspart (novoLOG) injection 0-9 Units (has no administration in time range)  insulin aspart (novoLOG) injection 0-5 Units (has no administration in time range)     ED Discharge Orders     None        Note:  This document was prepared using Dragon voice recognition software and may include unintentional dictation errors.    Concha Se, MD 07/04/21 1132    Concha Se, MD 07/04/21 [phone]

## 2021-07-04 NOTE — Consult Note (Signed)
WOC Nurse has reviewed record and this patient has a positive xray or MRI for osteomyelitis, this is considered outside of the scope of practice for the WOC nurse, for that reason WOC Nurse will not consult.   Re-consult if only topical wound care needed after podiatry or orthopedic consultation.  Trampas Stettner [facility] MSN, RN, [place], CNS, [place] [phone]

## 2021-07-04 NOTE — H&P (Signed)
History and Physical    Alexander Gill RCB:[id number] DOB: Aug 12, 1955 DOA: 07/04/2021  Referring MD/NP/PA:   PCP: Azucena Cecil, MD   Patient coming from:  The patient is coming from home.  At baseline, pt is independent for most of ADL.        Chief Complaint: right 4th toe infection  HPI: Alexander Gill is a 65 y.o. male with medical history significant of hypertension, hyperlipidemia, diabetes mellitus, hypothyroidism, who presents with right foot fourth toe infection.  Pt has right foot ulcer and took Augmentin 3 weeks ago. Pt states that he twisted his right foot recently, and then noted right 4th toe swelling and redness, with reoccurring ulcer of the fourth toe. No pain.  No fever or chills. Patient was seen by Dr. Luana Shu in podiatry yesterday.  At that time they wanted patient to be admitted for IV antibiotics, and likely needs surgical debridement and potential amputation.  Patient does not have chest pain, shortness breath, cough.  No nausea, vomiting or diarrhea or abdominal pain.  No symptoms of UTI.  ED Course: pt was found to have WBC 12.5, lactic acid 1.3, pending COVID-19 PCR, electrolytes renal function okay, temperature normal, blood pressure 139/91, heart rate 68, RR 20, oxygen saturation 96% on room air.  X-ray of right foot showed evidence of osteomyelitis seen..  Patient is admitted to [place] bed as inpatient.  Dr. Cleda Mccreedy of podiatry is consulted.  Review of Systems:   General: no fevers, chills, no body weight gain, fatigue HEENT: no blurry vision, hearing changes or sore throat Respiratory: no dyspnea, coughing, wheezing CV: no chest pain, no palpitations GI: no nausea, vomiting, abdominal pain, diarrhea, constipation GU: no dysuria, burning on urination, increased urinary frequency, hematuria  Ext: no leg edema Neuro: no unilateral weakness, numbness, or tingling, no vision change or hearing loss Skin: no rash. Has right 4th toe ulcer with infection. MSK: No  muscle spasm, no deformity, no limitation of range of movement in spin Heme: No easy bruising.  Travel history: No recent long distant travel.  Allergy: No Known Allergies  Past Medical History:  Diagnosis Date   Cataract    bilateral eyes   Diabetes mellitus without complication ([place])    Heart abnormality    Enlarged left ventricle   Hypertension     Past Surgical History:  Procedure Laterality Date   HERNIA REPAIR      Social History:  reports that he has never smoked. He has never used smokeless tobacco. He reports that he does not currently use alcohol. He reports that he does not currently use drugs.  Family History:  Family History  Problem Relation Age of Onset   Diabetes Mother    Diabetes Father    Parkinson's disease Brother      Prior to Admission medications   Medication Sig Start Date End Date Taking? Authorizing Provider  amoxicillin-clavulanate (AUGMENTIN) 875-125 MG tablet Take 1 tablet by mouth 2 (two) times daily. 03/03/21   Tsosie Billing, MD  aspirin EC 81 MG tablet Take 81 mg by mouth daily. Swallow whole.    [provider]  atenolol (TENORMIN) 25 MG tablet Take 25 mg by mouth daily. 12/11/20   [provider]  cholecalciferol (VITAMIN D3) 25 MCG (1000 UNIT) tablet Take 1,000 Units by mouth daily.    [provider]  Dapagliflozin-metFORMIN HCl ER (XIGDUO XR) 11-998 MG TB24 Take 1 tablet by mouth daily at 2 am.    [provider]  levothyroxine (  SYNTHROID) 25 MCG tablet Take 25 mcg by mouth daily. 09/27/20   [provider]  repaglinide (PRANDIN) 1 MG tablet Take 1 mg by mouth daily. Take 30 minutes prior to dinner. 01/08/21   [provider]  rosuvastatin (CRESTOR) 20 MG tablet Take 20 mg by mouth daily. 10/16/20   [provider]  Insulin Glargine (BASAGLAR KWIKPEN) 100 UNIT/ML Inject 30 Units into the skin at bedtime. 01/08/21 01/17/21  [provider]   losartan-hydrochlorothiazide (HYZAAR) 100-25 MG tablet Take 1 tablet by mouth daily. Patient not taking: Reported on 01/16/2021 01/08/21 01/17/21  [provider]  metFORMIN (GLUCOPHAGE) 1000 MG tablet Take 1,000 mg by mouth 2 (two) times daily. 12/10/20 01/17/21  [provider]    Physical Exam: Vitals:   07/04/21 0805 07/04/21 1103 07/04/21 1200 07/04/21 1230  BP: 136/68 (!) 139/91 117/66 122/66  Pulse: 61 68 (!) 59 60  Resp: '20 18  16  ' Temp: 98.3 F (36.8 C) 98.4 F (36.9 C)    TempSrc: Oral Oral    SpO2: 97% 96% 96% 94%  Weight:      Height:       General: Not in acute distress HEENT:       Eyes: PERRL, EOMI, no scleral icterus.       ENT: No discharge from the ears and nose, no pharynx injection, no tonsillar enlargement.        Neck: No JVD, no bruit, no mass felt. Heme: No neck lymph node enlargement. Cardiac: S1/S2, RRR, No murmurs, No gallops or rubs. Respiratory: No rales, wheezing, rhonchi or rubs. GI: Soft, nondistended, nontender, no rebound pain, no organomegaly, BS present. GU: No hematuria Ext: No pitting leg edema bilaterally. 1+DP/PT pulse bilaterally. Musculoskeletal: No joint deformities, No joint redness or warmth, no limitation of ROM in spin. Skin: Has an ulcer, swelling, erythema in right fourth toe, with surrounding erythema in right foot.    Neuro: Alert, oriented X3, cranial nerves II-XII grossly intact, moves all extremities normally.  Psych: Patient is not psychotic, no suicidal or hemocidal ideation.  Labs on Admission: I have personally reviewed following labs and imaging studies  CBC: Recent Labs  Lab 07/04/21 0812  WBC 12.5*  HGB 15.1  HCT 45.6  MCV 87.7  PLT 646   Basic Metabolic Panel: Recent Labs  Lab 07/04/21 0812  NA 136  K 3.5  CL 101  CO2 27  GLUCOSE 173*  BUN 17  CREATININE 0.72  CALCIUM 9.1   GFR: Estimated Creatinine Clearance: 102.4 mL/min (by C-G formula based on SCr of 0.72 mg/dL). Liver  Function Tests: No results for input(s): AST, ALT, ALKPHOS, BILITOT, PROT, ALBUMIN in the last 168 hours. No results for input(s): LIPASE, AMYLASE in the last 168 hours. No results for input(s): AMMONIA in the last 168 hours. Coagulation Profile: No results for input(s): INR, PROTIME in the last 168 hours. Cardiac Enzymes: No results for input(s): CKTOTAL, CKMB, CKMBINDEX, TROPONINI in the last 168 hours. BNP (last 3 results) No results for input(s): PROBNP in the last 8760 hours. HbA1C: No results for input(s): HGBA1C in the last 72 hours. CBG: No results for input(s): GLUCAP in the last 168 hours. Lipid Profile: No results for input(s): CHOL, HDL, LDLCALC, TRIG, CHOLHDL, LDLDIRECT in the last 72 hours. Thyroid Function Tests: No results for input(s): TSH, T4TOTAL, FREET4, T3FREE, THYROIDAB in the last 72 hours. Anemia Panel: No results for input(s): VITAMINB12, FOLATE, FERRITIN, TIBC, IRON, RETICCTPCT in the last 72 hours. Urine analysis:  No results found for: COLORURINE, APPEARANCEUR, LABSPEC, PHURINE, GLUCOSEU, HGBUR, BILIRUBINUR, KETONESUR, PROTEINUR, UROBILINOGEN, NITRITE, LEUKOCYTESUR Sepsis Labs: '@LABRCNTIP' (procalcitonin:4,lacticidven:4) )No results found for this or any previous visit (from the past 240 hour(s)).   Radiological Exams on Admission: DG Foot Complete Right  Result Date: 07/04/2021 CLINICAL DATA:  Right foot wound. EXAM: RIGHT FOOT COMPLETE - 3+ VIEW COMPARISON:  None. FINDINGS: Soft tissue swelling is seen involving the fourth toe concerning for cellulitis. Ulceration is seen involving the distal portion of the fourth toe. Lytic destruction is seen involving portions of the fourth middle and distal phalanges concerning for osteomyelitis. No other bony abnormality is noted. IMPRESSION: Ulceration is seen involving the fourth toe with underlying lytic destruction of the fourth middle and distal phalanges concerning for osteomyelitis. Electronically Signed   By:  Marijo Conception M.D.   On: 07/04/2021 10:29     EKG: Not done in ED, will get one.   Assessment/Plan Principal Problem:   Osteomyelitis of toe of right foot (HCC) Active Problems:   Diabetes mellitus without complication (HCC)   Hypertension   HLD (hyperlipidemia)   Hypothyroidism   Osteomyelitis of toe of right foot [facility]): Patient has leukocytosis with WBC 12.5, but no fever.  No sepsis. Dr. Cleda Mccreedy of podiatry is consulted  - Admitted to [place] bed as inpatient - Empiric antimicrobial treatment with vancomycin Flagyl, Rocephin (patient received 1 dose of Zosyn in ED) - PRN Zofran for nausea, Tylenol for pain - Blood cultures x 2  - ESR and CRP - wound care consult - MRI-right foot  - will get Procalcitonin and trend lactic acid levels per sepsis protocol.  Diabetes mellitus without complication [facility]): Patient still taking Prandin and Xiguo at home -Sliding scale insulin  Hypertension -IV hydralazine as needed -Continue home atenolol  HLD (hyperlipidemia) -Crestor  Hypothyroidism -Synthroid   DVT ppx: SQ Heparin   Code Status: Full code Family Communication: not done, no family member is at bed side. Disposition Plan:  Anticipate discharge back to previous environment Consults called:  Dr.Cline of podiatry Admission status and  Level of care: Med-Surg:     as inpt      Status is: Inpatient  Remains inpatient appropriate because: Patient has multiple comorbidities, now presents with right foot 4th toe osteomyelitis.  Patient will likely needs surgical debridement and even amputation.  His presentation is highly complicated.  Patient will need to be treated in the hospital for at least 2 days.           Date of Service 07/04/2021    Ivor Costa [facility]   If 7PM-7AM, please contact night-coverage [email or web address] 07/04/2021, 1:26 PM

## 2021-07-04 NOTE — ED Notes (Signed)
Pt to MRI

## 2021-07-04 NOTE — ED Notes (Addendum)
Pt to ED for infx and swelling in fourth toe R foot. Hx DM and neuropathy. Twisted R ankle 2 days ago, yesterday noticed swelling and redness and saw podiatrist who recommended ED eval. Woke up this morning and foot looked worse. Denies pain. Tie is swollen, red with purulent drainage.

## 2021-07-04 NOTE — ED Notes (Signed)
Toe wound right foot cleaned with ns.  Dried and dsd applied.  Pt to mri.

## 2021-07-05 ENCOUNTER — Inpatient Hospital Stay: Payer: PRIVATE HEALTH INSURANCE

## 2021-07-05 ENCOUNTER — Inpatient Hospital Stay: Payer: PRIVATE HEALTH INSURANCE | Admitting: Certified Registered"

## 2021-07-05 ENCOUNTER — Encounter
Admission: EM | Disposition: A | Discharge status: Home / Self Care | Payer: Self-pay | Source: Home / Self Care | Attending: Family Medicine

## 2021-07-05 HISTORY — PX: AMPUTATION TOE: SHX6595

## 2021-07-05 LAB — CBC
HCT: 37.6 % — ABNORMAL LOW (ref 39.0–52.0)
Hemoglobin: 12.5 g/dL — ABNORMAL LOW (ref 13.0–17.0)
MCH: 28.8 pg (ref 26.0–34.0)
MCHC: 33.2 g/dL (ref 30.0–36.0)
MCV: 86.6 fL (ref 80.0–100.0)
Platelets: 276 10*3/uL (ref 150–400)
RBC: 4.34 MIL/uL (ref 4.22–5.81)
RDW: 13 % (ref 11.5–15.5)
WBC: 10.6 10*3/uL — ABNORMAL HIGH (ref 4.0–10.5)
nRBC: 0 % (ref 0.0–0.2)

## 2021-07-05 LAB — GLUCOSE, CAPILLARY
Glucose-Capillary: 119 mg/dL — ABNORMAL HIGH (ref 70–99)
Glucose-Capillary: 262 mg/dL — ABNORMAL HIGH (ref 70–99)
Glucose-Capillary: 139 mg/dL — ABNORMAL HIGH (ref 70–99)
Glucose-Capillary: 102 mg/dL — ABNORMAL HIGH (ref 70–99)
Glucose-Capillary: 103 mg/dL — ABNORMAL HIGH (ref 70–99)

## 2021-07-05 LAB — C-REACTIVE PROTEIN: CRP: 8.8 mg/dL — ABNORMAL HIGH (ref <1.0)

## 2021-07-05 LAB — CREATININE, SERUM
Creatinine, Ser: 0.87 mg/dL (ref 0.61–1.24)
GFR, Estimated: >60 mL/min (ref >60)

## 2021-07-05 SURGERY — AMPUTATION, TOE
Anesthesia: Monitor Anesthesia Care | Site: Toe | Laterality: Right

## 2021-07-05 MED ORDER — PROPOFOL 10 MG/ML IV BOLUS
INTRAVENOUS | Status: AC
  Filled 2021-07-05: qty 20

## 2021-07-05 MED ORDER — LIDOCAINE HCL (CARDIAC) PF 100 MG/5ML IV SOSY
PREFILLED_SYRINGE | INTRAVENOUS | Status: DC | PRN
  Administered 2021-07-05: 100 mg via INTRAVENOUS

## 2021-07-05 MED ORDER — PROPOFOL 10 MG/ML IV BOLUS
INTRAVENOUS | Status: DC | PRN
  Administered 2021-07-05: 50 mg via INTRAVENOUS
  Administered 2021-07-05: 20 mg via INTRAVENOUS
  Administered 2021-07-05: 30 mg via INTRAVENOUS
  Administered 2021-07-05 (×2): 20 mg via INTRAVENOUS

## 2021-07-05 MED ORDER — FENTANYL CITRATE (PF) 100 MCG/2ML IJ SOLN
INTRAMUSCULAR | Status: DC | PRN
  Administered 2021-07-05 (×4): 25 ug via INTRAVENOUS

## 2021-07-05 MED ORDER — MIDAZOLAM HCL 2 MG/2ML IJ SOLN
INTRAMUSCULAR | Status: DC | PRN
  Administered 2021-07-05: 2 mg via INTRAVENOUS

## 2021-07-05 MED ORDER — ONDANSETRON HCL 4 MG/2ML IJ SOLN: 4.0000 mg | Freq: Once | INTRAMUSCULAR | Status: DC | PRN

## 2021-07-05 MED ORDER — HYDROCODONE-ACETAMINOPHEN 5-325 MG PO TABS: 1.0000 | ORAL_TABLET | ORAL | Status: DC | PRN

## 2021-07-05 MED ORDER — HYDROCODONE-ACETAMINOPHEN 7.5-325 MG PO TABS
1.0000 | ORAL_TABLET | Freq: Once | ORAL | Status: DC | PRN
  Filled 2021-07-05: qty 1

## 2021-07-05 MED ORDER — 0.9 % SODIUM CHLORIDE (POUR BTL) OPTIME
TOPICAL | Status: DC | PRN
  Administered 2021-07-05: 500 mL

## 2021-07-05 MED ORDER — VANCOMYCIN HCL 1500 MG/300ML IV SOLN
1500.0000 mg | Freq: Two times a day (BID) | INTRAVENOUS | Status: DC
  Administered 2021-07-05 – 2021-07-06 (×3): 1500 mg via INTRAVENOUS
  Filled 2021-07-05 (×3): qty 300

## 2021-07-05 MED ORDER — LACTATED RINGERS IV SOLN: INTRAVENOUS | Status: DC

## 2021-07-05 MED ORDER — LIDOCAINE HCL (PF) 2 % IJ SOLN
INTRAMUSCULAR | Status: AC
  Filled 2021-07-05: qty 5

## 2021-07-05 MED ORDER — DROPERIDOL 2.5 MG/ML IJ SOLN
0.6250 mg | Freq: Once | INTRAMUSCULAR | Status: DC | PRN
  Filled 2021-07-05: qty 2

## 2021-07-05 MED ORDER — MEPERIDINE HCL 25 MG/ML IJ SOLN: 6.2500 mg | INTRAMUSCULAR | Status: DC | PRN

## 2021-07-05 MED ORDER — BUPIVACAINE HCL 0.5 % IJ SOLN
INTRAMUSCULAR | Status: DC | PRN
  Administered 2021-07-05: 10 mL

## 2021-07-05 MED ORDER — FENTANYL CITRATE (PF) 100 MCG/2ML IJ SOLN
INTRAMUSCULAR | Status: AC
  Filled 2021-07-05: qty 2

## 2021-07-05 MED ORDER — MIDAZOLAM HCL 2 MG/2ML IJ SOLN
INTRAMUSCULAR | Status: AC
  Filled 2021-07-05: qty 2

## 2021-07-05 MED ORDER — FENTANYL CITRATE (PF) 100 MCG/2ML IJ SOLN: 25.0000 ug | INTRAMUSCULAR | Status: DC | PRN

## 2021-07-05 MED ORDER — NEOMYCIN-POLYMYXIN B GU 40-200000 IR SOLN
Status: DC | PRN
  Administered 2021-07-05: 2 mL

## 2021-07-05 SURGICAL SUPPLY — 51 items
BLADE MED AGGRESSIVE (BLADE) ×2 IMPLANT
BLADE MINI RND TIP GREEN BEAV (BLADE) ×1 IMPLANT
BLADE OSC/SAGITTAL MD 5.5X18 (BLADE) ×2 IMPLANT
BLADE SURG 15 STRL LF DISP TIS (BLADE) ×2 IMPLANT
BLADE SURG 15 STRL SS (BLADE) ×2
BLADE SURG MINI STRL (BLADE) ×2 IMPLANT
BNDG CONFORM 2 STRL LF (GAUZE/BANDAGES/DRESSINGS) ×2 IMPLANT
BNDG CONFORM 3 STRL LF (GAUZE/BANDAGES/DRESSINGS) ×2 IMPLANT
BNDG ELASTIC 4X5.8 VLCR STR LF (GAUZE/BANDAGES/DRESSINGS) ×2 IMPLANT
BNDG ESMARK 4X12 TAN STRL LF (GAUZE/BANDAGES/DRESSINGS) ×2 IMPLANT
BNDG GAUZE ELAST 4 BULKY (GAUZE/BANDAGES/DRESSINGS) ×2 IMPLANT
BNDG STRETCH 4X75 STRL LF (GAUZE/BANDAGES/DRESSINGS) ×1 IMPLANT
CUFF TOURN SGL QUICK 12 (TOURNIQUET CUFF) ×2 IMPLANT
CUFF TOURN SGL QUICK 18X4 (TOURNIQUET CUFF) ×2 IMPLANT
DRAPE FLUOR MINI C-ARM 54X84 (DRAPES) ×2 IMPLANT
DURAPREP 26ML APPLICATOR (WOUND CARE) ×2 IMPLANT
ELECT REM PT RETURN 9FT ADLT (ELECTROSURGICAL) ×2
ELECTRODE REM PT RTRN 9FT ADLT (ELECTROSURGICAL) ×1 IMPLANT
GAUZE 4X4 16PLY ~~LOC~~+RFID DBL (SPONGE) ×2 IMPLANT
GAUZE SPONGE 4X4 12PLY STRL (GAUZE/BANDAGES/DRESSINGS) ×2 IMPLANT
GAUZE XEROFORM 1X8 LF (GAUZE/BANDAGES/DRESSINGS) ×2 IMPLANT
GLOVE SURG ENC MOIS LTX SZ7.5 (GLOVE) ×2 IMPLANT
GLOVE SURG UNDER LTX SZ8 (GLOVE) ×2 IMPLANT
GOWN STRL REUS W/ TWL LRG LVL3 (GOWN DISPOSABLE) ×2 IMPLANT
GOWN STRL REUS W/TWL LRG LVL3 (GOWN DISPOSABLE) ×2
HANDPIECE VERSAJET DEBRIDEMENT (MISCELLANEOUS) ×2 IMPLANT
IV NS IRRIG 3000ML ARTHROMATIC (IV SOLUTION) ×2 IMPLANT
KIT TURNOVER KIT A (KITS) ×2 IMPLANT
LABEL OR SOLS (LABEL) ×2 IMPLANT
MANIFOLD NEPTUNE II (INSTRUMENTS) ×2 IMPLANT
NDL FILTER BLUNT 18X1 1/2 (NEEDLE) ×1 IMPLANT
NDL HYPO 25X1 1.5 SAFETY (NEEDLE) ×2 IMPLANT
NEEDLE FILTER BLUNT 18X 1/2SAF (NEEDLE) ×1
NEEDLE FILTER BLUNT 18X1 1/2 (NEEDLE) ×1 IMPLANT
NEEDLE HYPO 25X1 1.5 SAFETY (NEEDLE) ×4 IMPLANT
NS IRRIG 500ML POUR BTL (IV SOLUTION) ×2 IMPLANT
PACK EXTREMITY ARMC (MISCELLANEOUS) ×2 IMPLANT
PAD ABD DERMACEA PRESS 5X9 (GAUZE/BANDAGES/DRESSINGS) ×3 IMPLANT
SOL PREP PVP 2OZ (MISCELLANEOUS) ×2
SOLUTION PREP PVP 2OZ (MISCELLANEOUS) ×1 IMPLANT
STOCKINETTE BIAS CUT 4 980044 (GAUZE/BANDAGES/DRESSINGS) ×2 IMPLANT
STOCKINETTE STRL 6IN 960660 (GAUZE/BANDAGES/DRESSINGS) ×2 IMPLANT
STRIP CLOSURE SKIN 1/4X4 (GAUZE/BANDAGES/DRESSINGS) ×2 IMPLANT
SUT ETHILON 3-0 FS-10 30 BLK (SUTURE) ×2
SUT ETHILON 4-0 (SUTURE)
SUT ETHILON 4-0 FS2 18XMFL BLK (SUTURE)
SUTURE EHLN 3-0 FS-10 30 BLK (SUTURE) ×1 IMPLANT
SUTURE ETHLN 4-0 FS2 18XMF BLK (SUTURE) IMPLANT
SWAB CULTURE AMIES ANAERIB BLU (MISCELLANEOUS) ×2 IMPLANT
SYR 10ML LL (SYRINGE) ×2 IMPLANT
WATER STERILE IRR 500ML POUR (IV SOLUTION) ×2 IMPLANT

## 2021-07-05 NOTE — Interval H&P Note (Signed)
History and Physical Interval Note:  07/05/2021 5:43 PM  Alexander Gill  has presented today for surgery, with the diagnosis of infection.  The various methods of treatment have been discussed with the patient and family. After consideration of risks, benefits and other options for treatment, the patient has consented to  Procedure(s): AMPUTATION RIGHT 4TH TOE (Right) as a surgical intervention.  The patient's history has been reviewed, patient examined, no change in status, stable for surgery.  I have reviewed the patient's chart and labs.  Questions were answered to the patient's satisfaction.     Ricci Barker

## 2021-07-05 NOTE — Assessment & Plan Note (Signed)
crestor

## 2021-07-05 NOTE — Assessment & Plan Note (Addendum)
Resume home regimen. 

## 2021-07-05 NOTE — Plan of Care (Signed)
Patient arrived to room 138 in stable condition. Aox4. No complaints of pain or discomfort. Plan of care reviewed with patient. Room/unit orientation completed. Patient independent room. Call bell within reach.  PLAN OF CARE ONGOING Problem: Education: Goal: Knowledge of General Education information will improve Description: Including pain rating scale, medication(s)/side effects and non-pharmacologic comfort measures Outcome: Progressing   Problem: Health Behavior/Discharge Planning: Goal: Ability to manage health-related needs will improve Outcome: Progressing   Problem: Clinical Measurements: Goal: Ability to maintain clinical measurements within normal limits will improve Outcome: Progressing Goal: Will remain free from infection Outcome: Progressing Goal: Diagnostic test results will improve Outcome: Progressing Goal: Respiratory complications will improve Outcome: Progressing Goal: Cardiovascular complication will be avoided Outcome: Progressing   Problem: Activity: Goal: Risk for activity intolerance will decrease Outcome: Progressing   Problem: Nutrition: Goal: Adequate nutrition will be maintained Outcome: Progressing   Problem: Coping: Goal: Level of anxiety will decrease Outcome: Progressing   Problem: Elimination: Goal: Will not experience complications related to bowel motility Outcome: Progressing Goal: Will not experience complications related to urinary retention Outcome: Progressing   Problem: Pain Managment: Goal: General experience of comfort will improve Outcome: Progressing   Problem: Safety: Goal: Ability to remain free from injury will improve Outcome: Progressing   Problem: Skin Integrity: Goal: Risk for impaired skin integrity will decrease Outcome: Progressing

## 2021-07-05 NOTE — Op Note (Signed)
Date of operation: 07/05/2021.  Surgeon: Durward Fortes D.P.M.  Preoperative diagnosis: Osteomyelitis right fourth toe.  Postoperative diagnosis: Same.  Procedure: Amputation right fourth toe metatarsal phalangeal joint.  Anesthesia: Local MAC.  Hemostasis: Esmarch bandage right ankle.  Estimated blood loss: 25 cc.  Cultures: Bone culture middle phalanx right fourth toe.  Pathology: Right fourth toe.  Complications: None apparent.  Operative indications: This is a 65 year old male with chronic history of an ulceration on his right fourth toe.  Recently sustained a fall and noticed aggravation of the toe.  X-rays and MRI revealed osteomyelitis and decision was made for amputation.  Operative procedure: Patient was taken to the operating room and placed on the table in the supine position.  Following satisfactory sedation the right forefoot was anesthetized with 10 cc of 0.5% Marcaine plain.  The foot was prepped and draped in the usual sterile fashion.  The foot was exsanguinated using an Esmarch which was left in place around the ankle as a tourniquet.  Attention was then directed to the right fourth toe where an elliptical incision was made coursing dorsal to plantar around the medial and lateral base of the toe.  Incision was carried sharply down to the level of the bone.  Significant bleeding was noted at the plantar apex and the Esmarch was released and then replaced slightly tighter.  Dissection was carried back proximal to the metatarsal phalangeal joint where the toe was disarticulated in toto.  Bleeders were bovied at the base of the wound.  The wound was flushed with copious amounts of sterile saline and closed using 3-0 nylon simple erupted sutures.  Xeroform 4 x 4's and ABD and Kerlix applied to the right foot and then the tourniquet was released.  Second ABD over the heel with Kerlix and Ace wrap applied.  Patient was awakened and transported to the PACU with vital signs stable and  in good condition.

## 2021-07-05 NOTE — Anesthesia Preprocedure Evaluation (Addendum)
Anesthesia Evaluation  Patient identified by MRN, date of birth, ID band Patient awake    Reviewed: Allergy & Precautions, NPO status , Patient's Chart, lab work & pertinent test results  History of Anesthesia Complications Negative for: history of anesthetic complications  Airway Mallampati: III  TM Distance: >3 FB Neck ROM: Full    Dental no notable dental hx.    Pulmonary neg pulmonary ROS,    Pulmonary exam normal breath sounds clear to auscultation       Cardiovascular Exercise Tolerance: Good METS: 3 - Mets hypertension, Pt. on medications and Pt. on home beta blockers negative cardio ROS Normal cardiovascular exam Rhythm:Regular Rate:Normal     Neuro/Psych negative neurological ROS  negative psych ROS   GI/Hepatic negative GI ROS, Neg liver ROS,   Endo/Other  diabetes, Well Controlled, Insulin DependentHypothyroidism   Renal/GU negative Renal ROS  negative genitourinary   Musculoskeletal negative musculoskeletal ROS (+)   Abdominal   Peds  Hematology negative hematology ROS (+)   Anesthesia Other Findings   Reproductive/Obstetrics negative OB ROS                            Anesthesia Physical Anesthesia Plan  ASA: 3 and emergent  Anesthesia Plan: MAC   Post-op Pain Management:    Induction: Intravenous  PONV Risk Score and Plan:   Airway Management Planned: Natural Airway and Nasal Cannula  Additional Equipment:   Intra-op Plan:   Post-operative Plan:   Informed Consent: I have reviewed the patients History and Physical, chart, labs and discussed the procedure including the risks, benefits and alternatives for the proposed anesthesia with the patient or authorized representative who has indicated his/her understanding and acceptance.     Dental Advisory Given  Plan Discussed with: Anesthesiologist, CRNA and Surgeon  Anesthesia Plan Comments: (Patient  consented for risks of anesthesia including but not limited to:  - adverse reactions to medications - damage to eyes, teeth, lips or other oral mucosa - nerve damage due to positioning  - sore throat or hoarseness - Damage to heart, brain, nerves, lungs, other parts of body or loss of life  Patient voiced understanding.)        Anesthesia Quick Evaluation

## 2021-07-05 NOTE — Assessment & Plan Note (Addendum)
MRI with 4th toe osteo - potentially changes c/w osteomyelitis in 2nd toe Now s/p amputation of R 4th toe on 12/17 with podiatry ABI with normal ankle brachial indices (could be falsely elevated per report), he has palpable pulses - follow with PCP to consider arterial duplex or CTA runoff outpatient  Continue vanc/ceftriaxone/flagyl -> narrow to doxy/augmentin x14 days at discharge - podiatry will reevaluate 2nd toe and need for intervention vs extending abx outpatient (discussed with podiatry) Appreciate podiatry recs - they're recommending follow up in 1 week, surgical shoe, pressure only on heel

## 2021-07-05 NOTE — Transfer of Care (Signed)
Immediate Anesthesia Transfer of Care Note  Patient: Alexander Gill  Procedure(s) Performed: AMPUTATION RIGHT 4TH TOE (Right: Toe)  Patient Location: PACU  Anesthesia Type:MAC  Level of Consciousness: awake, alert  and oriented  Airway & Oxygen Therapy: Patient Spontanous Breathing and Patient connected to face mask oxygen  Post-op Assessment: Report given to RN, Post -op Vital signs reviewed and stable and Patient moving all extremities  Post vital signs: Reviewed and stable  Last Vitals:  Vitals Value Taken Time  BP 148/85 07/05/21 1839  Temp    Pulse 51 07/05/21 1839  Resp 13 07/05/21 1839  SpO2 100 % 07/05/21 1839    Last Pain:  Vitals:   07/05/21 1718  TempSrc:   PainSc: 0-No pain      Patients Stated Pain Goal: 0 (01/74/94 4967)  Complications: No notable events documented.

## 2021-07-05 NOTE — Hospital Course (Addendum)
Alexander Gill is Alexander Gill 65 y.o. male with medical history significant of hypertension, hyperlipidemia, diabetes mellitus, hypothyroidism, who presents with right foot fourth toe infection.  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  Culture growing staph, he'll be discharged on augmentin/doxy with podiatry follow up.    See below for additional details

## 2021-07-05 NOTE — Assessment & Plan Note (Signed)
synthroid °

## 2021-07-05 NOTE — Anesthesia Postprocedure Evaluation (Signed)
Anesthesia Post Note  Patient: Alexander Gill  Procedure(s) Performed: AMPUTATION RIGHT 4TH TOE (Right: Toe)  Patient location during evaluation: PACU Anesthesia Type: MAC Level of consciousness: awake and alert Pain management: pain level controlled Vital Signs Assessment: post-procedure vital signs reviewed and stable Respiratory status: spontaneous breathing, nonlabored ventilation, respiratory function stable and patient connected to nasal cannula oxygen Cardiovascular status: stable and blood pressure returned to baseline Postop Assessment: no apparent nausea or vomiting Anesthetic complications: no   No notable events documented.   Last Vitals:  Vitals:   07/05/21 1845 07/05/21 1851  BP: (!) 157/82 (!) 163/81  Pulse: (!) 55 (!) 52  Resp: 10 15  Temp:  (!) 36.1 C  SpO2: 99% 99%    Last Pain:  Vitals:   07/05/21 1851  TempSrc:   PainSc: 0-No pain                 Tonny Bollman

## 2021-07-05 NOTE — Progress Notes (Signed)
Patient went to OR at approx 1700. Returned at 1900. Denies pain or nausea. Meal tray order - patient hungry. Laying in bed ABC intact. Denies needs.

## 2021-07-05 NOTE — Assessment & Plan Note (Signed)
atenolol

## 2021-07-05 NOTE — Progress Notes (Signed)
PROGRESS NOTE    Alexander Gill  UEK:[id number] DOB: 12/06/1955 DOA: 07/04/2021 PCP: Maia Petties, MD  Chief Complaint  Patient presents with   Wound Check    Brief Narrative:  Alexander Gill is Alexander Gill 65 y.o. male with medical history significant of hypertension, hyperlipidemia, diabetes mellitus, hypothyroidism, who presents with right foot fourth toe infection.    Assessment & Plan:   Principal Problem:   Osteomyelitis of toe of right foot (HCC) Active Problems:   Diabetes mellitus without complication (HCC)   Hypertension   Hypothyroidism   HLD (hyperlipidemia)   * Osteomyelitis of toe of right foot (HCC) MRI with 4th toe osteo - potentially changes c/w osteomyelitis in 2nd toe podiatry planning for amputation of 4th toe - 2nd toe thought to not be as acute at this time per their eval ABI with normal ankle brachial indices (could be falsely elevated per report), he has palpable pulses Continue vanc/ceftriaxone/flagyl  Diabetes mellitus without complication (HCC) SSI  Hypertension atenolol  Hypothyroidism synthroid  HLD (hyperlipidemia) crestor     DVT prophylaxis: heparin Code Status: full Family Communication: none Disposition:   Status is: Inpatient  Remains inpatient appropriate because: need for podiatry       Consultants:  podiatry  Procedures:  none  Antimicrobials:  Anti-infectives (From admission, onward)    Start     Dose/Rate Route Frequency Ordered Stop   07/05/21 0200  vancomycin (VANCOREADY) IVPB 1500 mg/300 mL        1,500 mg 150 mL/hr over 120 Minutes Intravenous Every 12 hours 07/04/21 1331     07/04/21 1430  metroNIDAZOLE (FLAGYL) IVPB 500 mg        500 mg 100 mL/hr over 60 Minutes Intravenous Every 12 hours 07/04/21 1251     07/04/21 1400  cefTRIAXone (ROCEPHIN) 2 g in sodium chloride 0.9 % 100 mL IVPB        2 g 200 mL/hr over 30 Minutes Intravenous Every 24 hours 07/04/21 1251     07/04/21 1215  vancomycin (VANCOCIN)  IVPB 1000 mg/200 mL premix       See Hyperspace for full Linked Orders Report.   1,000 mg 200 mL/hr over 60 Minutes Intravenous  Once 07/04/21 1105 07/04/21 1534   07/04/21 1115  vancomycin (VANCOCIN) IVPB 1000 mg/200 mL premix       See Hyperspace for full Linked Orders Report.   1,000 mg 200 mL/hr over 60 Minutes Intravenous  Once 07/04/21 1105 07/04/21 1423   07/04/21 1100  piperacillin-tazobactam (ZOSYN) IVPB 3.375 g        3.375 g 100 mL/hr over 30 Minutes Intravenous  Once 07/04/21 1059 07/04/21 1219       Subjective: No new complaints  Objective: Vitals:   07/05/21 0515 07/05/21 0746 07/05/21 0910 07/05/21 1121  BP: 126/72 (!) 141/67  129/63  Pulse: (!) 55 (!) 50 63 (!) 50  Resp: 18 17  18   Temp: 97.7 F (36.5 C) 97.8 F (36.6 C)  98.2 F (36.8 C)  TempSrc:      SpO2: 99% 100% 99% 95%  Weight:      Height:        Intake/Output Summary (Last 24 hours) at 07/05/2021 1525 Last data filed at 07/05/2021 1042 Gross per 24 hour  Intake 1812.07 ml  Output --  Net 1812.07 ml   Filed Weights   07/04/21 0804  Weight: 88.5 kg    Examination:  General exam: Appears calm and comfortable  Respiratory system: Clear to auscultation.  Respiratory effort normal. Cardiovascular system: RRR Gastrointestinal system: Abdomen is nondistended, soft and nontender Central nervous system: Alert and oriented. No focal neurological deficits. Extremities: RLE with intact dressing, palpable DP pulse Skin: No rashes, lesions or ulcers Psychiatry: Judgement and insight appear normal. Mood & affect appropriate.     Data Reviewed: I have personally reviewed following labs and imaging studies  CBC: Recent Labs  Lab 07/04/21 0812 07/05/21 0342  WBC 12.5* 10.6*  HGB 15.1 12.5*  HCT 45.6 37.6*  MCV 87.7 86.6  PLT 331 276    Basic Metabolic Panel: Recent Labs  Lab 07/04/21 0812 07/05/21 0342  NA 136  --   K 3.5  --   CL 101  --   CO2 27  --   GLUCOSE 173*  --   BUN 17   --   CREATININE 0.72 0.87  CALCIUM 9.1  --     GFR: Estimated Creatinine Clearance: 94.2 mL/min (by C-G formula based on SCr of 0.87 mg/dL).  Liver Function Tests: No results for input(s): AST, ALT, ALKPHOS, BILITOT, PROT, ALBUMIN in the last 168 hours.  CBG: Recent Labs  Lab 07/04/21 1745 07/04/21 2052 07/05/21 0748 07/05/21 1148 07/05/21 1516  GLUCAP 189* 145* 119* 262* 139*     Recent Results (from the past 240 hour(s))  Blood culture (routine x 2)     Status: None (Preliminary result)   Collection Time: 07/04/21 10:56 AM   Specimen: BLOOD  Result Value Ref Range Status   Specimen Description BLOOD BLOOD RIGHT HAND  Final   Special Requests   Final    BOTTLES DRAWN AEROBIC AND ANAEROBIC Blood Culture adequate volume   Culture   Final    NO GROWTH < 24 HOURS Performed at [facility], [address], [place], [place] [address]    Report Status PENDING  Incomplete  Blood culture (routine x 2)     Status: None (Preliminary result)   Collection Time: 07/04/21 11:01 AM   Specimen: BLOOD  Result Value Ref Range Status   Specimen Description BLOOD BLOOD RIGHT HAND  Final   Special Requests   Final    BOTTLES DRAWN AEROBIC AND ANAEROBIC Blood Culture adequate volume   Culture   Final    NO GROWTH < 24 HOURS Performed at [facility], [address], [place], [place] [address]    Report Status PENDING  Incomplete  Resp Panel by RT-PCR (Flu Paislea Hatton&B, Covid) Nasopharyngeal Swab     Status: None   Collection Time: 07/04/21 12:25 PM   Specimen: Nasopharyngeal Swab; Nasopharyngeal(NP) swabs in vial transport medium  Result Value Ref Range Status   SARS Coronavirus 2 by RT PCR NEGATIVE NEGATIVE Final    Comment: (NOTE) SARS-CoV-2 target nucleic acids are NOT DETECTED.  The SARS-CoV-2 RNA is generally detectable in upper respiratory specimens during the acute phase of infection. The lowest concentration of SARS-CoV-2 viral copies this assay can detect  is 138 copies/mL. Mikiyah Glasner negative result does not preclude SARS-Cov-2 infection and should not be used as the sole basis for treatment or other patient management decisions. Geoffry Bannister negative result may occur with  improper specimen collection/handling, submission of specimen other than nasopharyngeal swab, presence of viral mutation(s) within the areas targeted by this assay, and inadequate number of viral copies(<138 copies/mL). Reizel Calzada negative result must be combined with clinical observations, patient history, and epidemiological information. The expected result is Negative.  Fact Sheet for Patients:  BloggerCourse.com  Fact Sheet for Healthcare Providers:  SeriousBroker.it  This test is no t yet approved or cleared by the [place] and  has been authorized for detection and/or diagnosis of SARS-CoV-2 by FDA under an Emergency Use Authorization (EUA). This EUA will remain  in effect (meaning this test can be used) for the duration of the COVID-19 declaration under Section 564(b)(1) of the Act, 21 U.S.C.section 360bbb-3(b)(1), unless the authorization is terminated  or revoked sooner.       Influenza Aireana Ryland by PCR NEGATIVE NEGATIVE Final   Influenza B by PCR NEGATIVE NEGATIVE Final    Comment: (NOTE) The Xpert Xpress SARS-CoV-2/FLU/RSV plus assay is intended as an aid in the diagnosis of influenza from Nasopharyngeal swab specimens and should not be used as Juwaun Inskeep sole basis for treatment. Nasal washings and aspirates are unacceptable for Xpert Xpress SARS-CoV-2/FLU/RSV testing.  Fact Sheet for Patients: BloggerCourse.com  Fact Sheet for Healthcare Providers: SeriousBroker.it  This test is not yet approved or cleared by the [place] FDA and has been authorized for detection and/or diagnosis of SARS-CoV-2 by FDA under an Emergency Use Authorization (EUA). This EUA will remain in effect  (meaning this test can be used) for the duration of the COVID-19 declaration under Section 564(b)(1) of the Act, 21 U.S.C. section 360bbb-3(b)(1), unless the authorization is terminated or revoked.  Performed at [facility], [address], [place], [place] [address]          Radiology Studies: MR FOOT RIGHT WO CONTRAST  Result Date: 07/04/2021 CLINICAL DATA:  Osteomyelitis, foot. Fourth toe swelling. History of diabetes and neuropathy. Ankle twisting injury 2 days ago. EXAM: MRI OF THE RIGHT FOREFOOT WITHOUT CONTRAST TECHNIQUE: Multiplanar, multisequence MR imaging of the right forefoot was performed. No intravenous contrast was administered. COMPARISON:  Foot radiographs 07/04/2021. Left forefoot MRI 01/16/2021. FINDINGS: Bones/Joint/Cartilage As seen on prior radiographs, there is cortical destruction of the middle and distal phalanges of the 4th toe surrounding the distal interphalangeal joint, highly suspicious for osteomyelitis. There is associated decreased T1 and increased T2 marrow signal, and interphalangeal joint effusion and an apparent sinus tract extending dorsally to the skin. Mild T2 hyperintensity is also present within the head of the proximal phalanx. There are less specific marrow changes within the proximal and middle phalanges of the 2nd toe, primarily on the T2 weighted images, but also involving the head of the proximal phalanx on the T1 weighted images. The proximal and distal phalanges of the great toe are ankylosed. The metatarsals and visualized bones of the midfoot appear unremarkable. Ligaments The Lisfranc ligament is intact. The collateral ligaments of the metatarsophalangeal joints are intact. Muscles and Tendons Marked tendinosis of the flexor hallucis longus tendon without focal tear. The additional forefoot tendons appear intact. Probable extensor tenosynovitis within the 4th toe. Soft tissues As above, skin ulceration and Issabella Rix probable sinus tract  dorsally in the 4th toe, extending to the underlying osseous abnormalities described above. No other sinus tract or focal fluid collections are identified. IMPRESSION: 1. As seen on prior radiographs, there are findings in the 4th toe which are consistent with osteomyelitis. There is cortical destruction of the middle and distal phalanges as well as mild T2 hyperintensity within the head of the proximal phalanx. Associated dorsal skin ulceration and small sinus tract. 2. Less specific marrow changes within the proximal and middle phalanges of the 2nd toe, also potentially osteomyelitis, but without apparent confirmatory adjacent soft tissue abnormality. 3. Ankylosed interphalangeal joint of the great toe. Underlying flexor hallucis longus tendinosis. Electronically  Signed   By: Carey Bullocks M.D.   On: 07/04/2021 13:55   US ARTERIAL ABI (SCREENING LOWER EXTREMITY)  Result Date: 07/05/2021 CLINICAL DATA:  65 year old male with bilateral lower extremity ulcers. EXAM: NONINVASIVE PHYSIOLOGIC VASCULAR STUDY OF BILATERAL LOWER EXTREMITIES TECHNIQUE: Evaluation of both lower extremities were performed at rest, including calculation of ankle-brachial indices with single level Doppler, pressure and pulse volume recording. COMPARISON:  None. FINDINGS: Right ABI:  1.21 Left ABI:  1.16 Right Lower Extremity:  Normal arterial waveforms at the ankle. Left Lower Extremity:  Normal arterial waveforms at the ankle. IMPRESSION: Normal ankle-brachial indices which could be falsely elevated. Arterial duplex or CTA runoff could be considered for further characterization/presence of lower extremity peripheral artery disease. Marliss Coots, MD [facility] Radiology Electronically Signed   By: Marliss Coots M.D.   On: 07/05/2021 14:10   DG Foot Complete Right  Result Date: 07/04/2021 CLINICAL DATA:  Right foot wound. EXAM: RIGHT FOOT COMPLETE - 3+ VIEW COMPARISON:  None.  FINDINGS: Soft tissue swelling is seen involving the fourth toe concerning for cellulitis. Ulceration is seen involving the distal portion of the fourth toe. Lytic destruction is seen involving portions of the fourth middle and distal phalanges concerning for osteomyelitis. No other bony abnormality is noted. IMPRESSION: Ulceration is seen involving the fourth toe with underlying lytic destruction of the fourth middle and distal phalanges concerning for osteomyelitis. Electronically Signed   By: Lupita Raider M.D.   On: 07/04/2021 10:29        Scheduled Meds:  aspirin EC  81 mg Oral Daily   atenolol  25 mg Oral Daily   bacitracin   Topical Daily   cholecalciferol  1,000 Units Oral Daily   heparin  5,000 Units Subcutaneous Q8H   insulin aspart  0-5 Units Subcutaneous QHS   insulin aspart  0-9 Units Subcutaneous TID WC   levothyroxine  25 mcg Oral Q0600   rosuvastatin  20 mg Oral Daily   Continuous Infusions:  sodium chloride 75 mL/hr at 07/05/21 0115   cefTRIAXone (ROCEPHIN)  IV 2 g (07/05/21 1423)   metronidazole 500 mg (07/05/21 1509)   vancomycin 1,500 mg (07/05/21 0122)     LOS: 1 day    Time spent: over 30 min    Lacretia Nicks, MD [facility]   To contact the attending provider between 7A-7P or the covering provider during after hours 7P-7A, please log into the web site [email or web address] and access using universal [place] password for that web site. If you do not have the password, please call the hospital operator.  07/05/2021, 3:25 PM

## 2021-07-06 ENCOUNTER — Encounter: Payer: Self-pay | Admitting: Podiatry

## 2021-07-06 LAB — COMPREHENSIVE METABOLIC PANEL
ALT: 15 U/L (ref 0–44)
AST: 12 U/L — ABNORMAL LOW (ref 15–41)
Albumin: 2.7 g/dL — ABNORMAL LOW (ref 3.5–5.0)
Alkaline Phosphatase: 43 U/L (ref 38–126)
Anion gap: 5 (ref 5–15)
BUN: 18 mg/dL (ref 8–23)
CO2: 24 mmol/L (ref 22–32)
Calcium: 8.2 mg/dL — ABNORMAL LOW (ref 8.9–10.3)
Chloride: 107 mmol/L (ref 98–111)
Creatinine, Ser: 0.76 mg/dL (ref 0.61–1.24)
GFR, Estimated: >60 mL/min (ref >60)
Glucose, Bld: 182 mg/dL — ABNORMAL HIGH (ref 70–99)
Potassium: 3.6 mmol/L (ref 3.5–5.1)
Sodium: 136 mmol/L (ref 135–145)
Total Bilirubin: 0.5 mg/dL (ref 0.3–1.2)
Total Protein: 6.3 g/dL — ABNORMAL LOW (ref 6.5–8.1)

## 2021-07-06 LAB — CBC WITH DIFFERENTIAL/PLATELET
Abs Immature Granulocytes: 0.04 10*3/uL (ref 0.00–0.07)
Basophils Absolute: 0.1 10*3/uL (ref 0.0–0.1)
Basophils Relative: 1 %
Eosinophils Absolute: 0.4 10*3/uL (ref 0.0–0.5)
Eosinophils Relative: 4 %
HCT: 35.9 % — ABNORMAL LOW (ref 39.0–52.0)
Hemoglobin: 11.9 g/dL — ABNORMAL LOW (ref 13.0–17.0)
Immature Granulocytes: 0 %
Lymphocytes Relative: 17 %
Lymphs Abs: 1.7 10*3/uL (ref 0.7–4.0)
MCH: 28.7 pg (ref 26.0–34.0)
MCHC: 33.1 g/dL (ref 30.0–36.0)
MCV: 86.7 fL (ref 80.0–100.0)
Monocytes Absolute: 0.9 10*3/uL (ref 0.1–1.0)
Monocytes Relative: 9 %
Neutro Abs: 6.9 10*3/uL (ref 1.7–7.7)
Neutrophils Relative %: 69 %
Platelets: 265 10*3/uL (ref 150–400)
RBC: 4.14 MIL/uL — ABNORMAL LOW (ref 4.22–5.81)
RDW: 13.1 % (ref 11.5–15.5)
WBC: 10 10*3/uL (ref 4.0–10.5)
nRBC: 0 % (ref 0.0–0.2)

## 2021-07-06 LAB — VANCOMYCIN, RANDOM: Vancomycin Rm: 12

## 2021-07-06 LAB — PHOSPHORUS: Phosphorus: 3.2 mg/dL (ref 2.5–4.6)

## 2021-07-06 LAB — MAGNESIUM: Magnesium: 2.2 mg/dL (ref 1.7–2.4)

## 2021-07-06 LAB — GLUCOSE, CAPILLARY
Glucose-Capillary: 147 mg/dL — ABNORMAL HIGH (ref 70–99)
Glucose-Capillary: 276 mg/dL — ABNORMAL HIGH (ref 70–99)
Glucose-Capillary: 220 mg/dL — ABNORMAL HIGH (ref 70–99)
Glucose-Capillary: 277 mg/dL — ABNORMAL HIGH (ref 70–99)

## 2021-07-06 MED ORDER — VANCOMYCIN VARIABLE DOSE PER UNSTABLE RENAL FUNCTION (PHARMACIST DOSING): Status: DC

## 2021-07-06 MED ORDER — INSULIN GLARGINE-YFGN 100 UNIT/ML ~~LOC~~ SOLN
20.0000 [IU] | Freq: Every day | SUBCUTANEOUS | Status: DC
  Administered 2021-07-06: 21:00:00 20 [IU] via SUBCUTANEOUS
  Filled 2021-07-06 (×2): qty 0.2

## 2021-07-06 MED ORDER — VANCOMYCIN HCL 1500 MG/300ML IV SOLN
1500.0000 mg | Freq: Two times a day (BID) | INTRAVENOUS | Status: DC
Start: 2021-07-06 — End: 2021-07-07
  Administered 2021-07-06 – 2021-07-07 (×2): 1500 mg via INTRAVENOUS
  Filled 2021-07-06 (×3): qty 300

## 2021-07-06 NOTE — Progress Notes (Signed)
PROGRESS NOTE    Alexander Gill  VAN:[id number] DOB: 1956-03-28 DOA: 07/04/2021 PCP: Alexander Petties, MD  Chief Complaint  Patient presents with   Wound Check    Brief Narrative:  Alexander Gill is Alexander Gill 65 y.o. male with medical history significant of hypertension, hyperlipidemia, diabetes mellitus, hypothyroidism, who presents with right foot fourth toe infection.    Assessment & Plan:   Principal Problem:   Osteomyelitis of toe of right foot (HCC) Active Problems:   Diabetes mellitus without complication (HCC)   Hypertension   Hypothyroidism   HLD (hyperlipidemia)   * Osteomyelitis of toe of right foot (HCC) MRI with 4th toe osteo - potentially changes c/w osteomyelitis in 2nd toe Now s/p amputation of R 4th toe on 12/17 with podiatry ABI with normal ankle brachial indices (could be falsely elevated per report), he has palpable pulses Continue vanc/ceftriaxone/flagyl Appreciate podiatry recs  Diabetes mellitus without complication (HCC) SSI Will start basal  Hypertension atenolol  Hypothyroidism synthroid  HLD (hyperlipidemia) crestor     DVT prophylaxis: heparin Code Status: full Family Communication: none Disposition:   Status is: Inpatient  Remains inpatient appropriate because: need for podiatry       Consultants:  podiatry  Procedures:  Amputation right fourth toe metatarsal phalangeal joint. 12/17  Antimicrobials:  Anti-infectives (From admission, onward)    Start     Dose/Rate Route Frequency Ordered Stop   07/06/21 1059  vancomycin variable dose per unstable renal function (pharmacist dosing)         Does not apply See admin instructions 07/06/21 1059     07/05/21 1600  vancomycin (VANCOREADY) IVPB 1500 mg/300 mL  Status:  Discontinued        1,500 mg 150 mL/hr over 120 Minutes Intravenous Every 12 hours 07/05/21 1529 07/06/21 1059   07/05/21 0200  vancomycin (VANCOREADY) IVPB 1500 mg/300 mL  Status:  Discontinued        1,500 mg 150  mL/hr over 120 Minutes Intravenous Every 12 hours 07/04/21 1331 07/05/21 1529   07/04/21 1430  metroNIDAZOLE (FLAGYL) IVPB 500 mg        500 mg 100 mL/hr over 60 Minutes Intravenous Every 12 hours 07/04/21 1251     07/04/21 1400  cefTRIAXone (ROCEPHIN) 2 g in sodium chloride 0.9 % 100 mL IVPB        2 g 200 mL/hr over 30 Minutes Intravenous Every 24 hours 07/04/21 1251     07/04/21 1215  vancomycin (VANCOCIN) IVPB 1000 mg/200 mL premix       See Hyperspace for full Linked Orders Report.   1,000 mg 200 mL/hr over 60 Minutes Intravenous  Once 07/04/21 1105 07/04/21 1534   07/04/21 1115  vancomycin (VANCOCIN) IVPB 1000 mg/200 mL premix       See Hyperspace for full Linked Orders Report.   1,000 mg 200 mL/hr over 60 Minutes Intravenous  Once 07/04/21 1105 07/04/21 1423   07/04/21 1100  piperacillin-tazobactam (ZOSYN) IVPB 3.375 g        3.375 g 100 mL/hr over 30 Minutes Intravenous  Once 07/04/21 1059 07/04/21 1219       Subjective: No new complaints  Objective: Vitals:   07/06/21 0414 07/06/21 0416 07/06/21 0836 07/06/21 1129  BP: 116/70 116/70 129/68 124/65  Pulse: (!) 57 (!) 57 (!) 51 (!) 56  Resp: 17 17 19 18   Temp: 98.4 F (36.9 C) 98.4 F (36.9 C) 98.3 F (36.8 C) 98.8 F (37.1 C)  TempSrc:  Oral  SpO2: 96% 96% 97% 98%  Weight:      Height:        Intake/Output Summary (Last 24 hours) at 07/06/2021 1531 Last data filed at 07/06/2021 1438 Gross per 24 hour  Intake 960 ml  Output 875 ml  Net 85 ml   Filed Weights   07/04/21 0804  Weight: 88.5 kg    Examination:  General: No acute distress. Cardiovascular: RRR Lungs: unlabored Abdomen: Soft, nontender, nondistended  Neurological: Alert and oriented 3. Moves all extremities 4 . Cranial nerves II through XII grossly intact. Skin: Warm and dry. No rashes or lesions. Extremities: RLE with dressing intact    Data Reviewed: I have personally reviewed following labs and imaging studies  CBC: Recent  Labs  Lab 07/04/21 0812 07/05/21 0342 07/06/21 0346  WBC 12.5* 10.6* 10.0  NEUTROABS  --   --  6.9  HGB 15.1 12.5* 11.9*  HCT 45.6 37.6* 35.9*  MCV 87.7 86.6 86.7  PLT 331 276 265    Basic Metabolic Panel: Recent Labs  Lab 07/04/21 0812 07/05/21 0342 07/06/21 0346  NA 136  --  136  K 3.5  --  3.6  CL 101  --  107  CO2 27  --  24  GLUCOSE 173*  --  182*  BUN 17  --  18  CREATININE 0.72 0.87 0.76  CALCIUM 9.1  --  8.2*  MG  --   --  2.2  PHOS  --   --  3.2    GFR: Estimated Creatinine Clearance: 102.4 mL/min (by C-G formula based on SCr of 0.76 mg/dL).  Liver Function Tests: Recent Labs  Lab 07/06/21 0346  AST 12*  ALT 15  ALKPHOS 43  BILITOT 0.5  PROT 6.3*  ALBUMIN 2.7*    CBG: Recent Labs  Lab 07/05/21 1516 07/05/21 1850 07/05/21 1955 07/06/21 0829 07/06/21 1130  GLUCAP 139* 102* 103* 147* 276*     Recent Results (from the past 240 hour(s))  Blood culture (routine x 2)     Status: None (Preliminary result)   Collection Time: 07/04/21 10:56 AM   Specimen: BLOOD  Result Value Ref Range Status   Specimen Description BLOOD BLOOD RIGHT HAND  Final   Special Requests   Final    BOTTLES DRAWN AEROBIC AND ANAEROBIC Blood Culture adequate volume   Culture   Final    NO GROWTH 2 DAYS Performed at [facility], [address] [place], [place] [address]    Report Status PENDING  Incomplete  Blood culture (routine x 2)     Status: None (Preliminary result)   Collection Time: 07/04/21 11:01 AM   Specimen: BLOOD  Result Value Ref Range Status   Specimen Description BLOOD BLOOD RIGHT HAND  Final   Special Requests   Final    BOTTLES DRAWN AEROBIC AND ANAEROBIC Blood Culture adequate volume   Culture   Final    NO GROWTH 2 DAYS Performed at [facility], [address] [place], [place] [address]    Report Status PENDING  Incomplete  Resp Panel by RT-PCR (Flu Kaysia Willard&B, Covid) Nasopharyngeal Swab     Status: None   Collection Time:  07/04/21 12:25 PM   Specimen: Nasopharyngeal Swab; Nasopharyngeal(NP) swabs in vial transport medium  Result Value Ref Range Status   SARS Coronavirus 2 by RT PCR NEGATIVE NEGATIVE Final    Comment: (NOTE) SARS-CoV-2 target nucleic acids are NOT DETECTED.  The SARS-CoV-2 RNA is generally detectable in upper  respiratory specimens during the acute phase of infection. The lowest concentration of SARS-CoV-2 viral copies this assay can detect is 138 copies/mL. Shakthi Scipio negative result does not preclude SARS-Cov-2 infection and should not be used as the sole basis for treatment or other patient management decisions. Reagyn Facemire negative result may occur with  improper specimen collection/handling, submission of specimen other than nasopharyngeal swab, presence of viral mutation(s) within the areas targeted by this assay, and inadequate number of viral copies(<138 copies/mL). Nellie Chevalier negative result must be combined with clinical observations, patient history, and epidemiological information. The expected result is Negative.  Fact Sheet for Patients:  BloggerCourse.com  Fact Sheet for Healthcare Providers:  SeriousBroker.it  This test is no t yet approved or cleared by the [place] FDA and  has been authorized for detection and/or diagnosis of SARS-CoV-2 by FDA under an Emergency Use Authorization (EUA). This EUA will remain  in effect (meaning this test can be used) for the duration of the COVID-19 declaration under Section 564(b)(1) of the Act, 21 U.S.C.section 360bbb-3(b)(1), unless the authorization is terminated  or revoked sooner.       Influenza Cylee Dattilo by PCR NEGATIVE NEGATIVE Final   Influenza B by PCR NEGATIVE NEGATIVE Final    Comment: (NOTE) The Xpert Xpress SARS-CoV-2/FLU/RSV plus assay is intended as an aid in the diagnosis of influenza from Nasopharyngeal swab specimens and should not be used as Sufyan Meidinger sole basis for treatment. Nasal washings  and aspirates are unacceptable for Xpert Xpress SARS-CoV-2/FLU/RSV testing.  Fact Sheet for Patients: BloggerCourse.com  Fact Sheet for Healthcare Providers: SeriousBroker.it  This test is not yet approved or cleared by the [place] FDA and has been authorized for detection and/or diagnosis of SARS-CoV-2 by FDA under an Emergency Use Authorization (EUA). This EUA will remain in effect (meaning this test can be used) for the duration of the COVID-19 declaration under Section 564(b)(1) of the Act, 21 U.S.C. section 360bbb-3(b)(1), unless the authorization is terminated or revoked.  Performed at [facility], [address] [place], [place] [address]   Aerobic/Anaerobic Culture w Gram Stain (surgical/deep wound)     Status: None (Preliminary result)   Collection Time: 07/05/21  6:26 PM   Specimen: PATH Bone biopsy; Tissue  Result Value Ref Range Status   Specimen Description   Final    BONE 2nd TOE RIGHT FOOT Performed at [facility], [address], [place], [place] [address]    Special Requests   Final    NONE Performed at [facility], [address] [place], [place] [address]    Gram Stain   Final    NO SQUAMOUS EPITHELIAL CELLS SEEN NO WBC SEEN NO ORGANISMS SEEN Performed at [facility], [address], [place], [place] [address]    Culture PENDING  Incomplete   Report Status PENDING  Incomplete         Radiology Studies: US ARTERIAL ABI (SCREENING LOWER EXTREMITY)  Result Date: 07/05/2021 CLINICAL DATA:  65 year old male with bilateral lower extremity ulcers. EXAM: NONINVASIVE PHYSIOLOGIC VASCULAR STUDY OF BILATERAL LOWER EXTREMITIES TECHNIQUE: Evaluation of both lower extremities were performed at rest, including calculation of ankle-brachial indices with single level Doppler, pressure and pulse volume recording. COMPARISON:  None. FINDINGS: Right ABI:  1.21 Left ABI:   1.16 Right Lower Extremity:  Normal arterial waveforms at the ankle. Left Lower Extremity:  Normal arterial waveforms at the ankle. IMPRESSION: Normal ankle-brachial indices which could be falsely elevated. Arterial duplex or CTA runoff could  be considered for further characterization/presence of lower extremity peripheral artery disease. Marliss Coots, MD [facility] Radiology Electronically Signed   By: Marliss Coots M.D.   On: 07/05/2021 14:10        Scheduled Meds:  aspirin EC  81 mg Oral Daily   atenolol  25 mg Oral Daily   bacitracin   Topical Daily   cholecalciferol  1,000 Units Oral Daily   heparin  5,000 Units Subcutaneous Q8H   insulin aspart  0-5 Units Subcutaneous QHS   insulin aspart  0-9 Units Subcutaneous TID WC   levothyroxine  25 mcg Oral Q0600   rosuvastatin  20 mg Oral Daily   vancomycin variable dose per unstable renal function (pharmacist dosing)   Does not apply See admin instructions   Continuous Infusions:  sodium chloride 75 mL/hr at 07/06/21 0113   cefTRIAXone (ROCEPHIN)  IV 2 g (07/06/21 1418)   metronidazole 500 mg (07/06/21 1505)     LOS: 2 days    Time spent: over 30 min    Lacretia Nicks, MD [facility]   To contact the attending provider between 7A-7P or the covering provider during after hours 7P-7A, please log into the web site [email or web address] and access using universal [place] password for that web site. If you do not have the password, please call the hospital operator.  07/06/2021, 3:31 PM

## 2021-07-06 NOTE — Progress Notes (Signed)
1 Day Post-Op   Subjective/Chief Complaint: Patient seen.  No complaints of pain.   Objective: Vital signs in last 24 hours: Temp:  [97 F (36.1 C)-98.8 F (37.1 C)] 98.8 F (37.1 C) (12/18 1129) Pulse Rate:  [45-57] 56 (12/18 1129) Resp:  [10-20] 18 (12/18 1129) BP: (116-163)/(65-86) 124/65 (12/18 1129) SpO2:  [96 %-100 %] 98 % (12/18 1129) Last BM Date: 07/04/21  Intake/Output from previous day: 12/17 0701 - 12/18 0700 In: 840 [P.O.:240; I.V.:300; IV Piggyback:300] Out: 875 [Urine:850; Blood:25] Intake/Output this shift: Total I/O In: 240 [P.O.:240] Out: -   Mild bleeding on the bandaging.  Upon removal the incision is well coapted with cellulitis improving.  Skin edges appear viable at this point.  Continued full-thickness ulceration on the medial aspect of the second toe but appears stable.   Lab Results:  Recent Labs    07/05/21 0342 07/06/21 0346  WBC 10.6* 10.0  HGB 12.5* 11.9*  HCT 37.6* 35.9*  PLT 276 265   BMET Recent Labs    07/04/21 0812 07/05/21 0342 07/06/21 0346  NA 136  --  136  K 3.5  --  3.6  CL 101  --  107  CO2 27  --  24  GLUCOSE 173*  --  182*  BUN 17  --  18  CREATININE 0.72 0.87 0.76  CALCIUM 9.1  --  8.2*   PT/INR Recent Labs    07/04/21 1402  LABPROT 13.3  INR 1.0   ABG No results for input(s): PHART, HCO3 in the last 72 hours.  Invalid input(s): PCO2, PO2  Studies/Results: MR FOOT RIGHT WO CONTRAST  Result Date: 07/04/2021 CLINICAL DATA:  Osteomyelitis, foot. Fourth toe swelling. History of diabetes and neuropathy. Ankle twisting injury 2 days ago. EXAM: MRI OF THE RIGHT FOREFOOT WITHOUT CONTRAST TECHNIQUE: Multiplanar, multisequence MR imaging of the right forefoot was performed. No intravenous contrast was administered. COMPARISON:  Foot radiographs 07/04/2021. Left forefoot MRI 01/16/2021. FINDINGS: Bones/Joint/Cartilage As seen on prior radiographs, there is cortical destruction of the middle and distal phalanges of  the 4th toe surrounding the distal interphalangeal joint, highly suspicious for osteomyelitis. There is associated decreased T1 and increased T2 marrow signal, and interphalangeal joint effusion and an apparent sinus tract extending dorsally to the skin. Mild T2 hyperintensity is also present within the head of the proximal phalanx. There are less specific marrow changes within the proximal and middle phalanges of the 2nd toe, primarily on the T2 weighted images, but also involving the head of the proximal phalanx on the T1 weighted images. The proximal and distal phalanges of the great toe are ankylosed. The metatarsals and visualized bones of the midfoot appear unremarkable. Ligaments The Lisfranc ligament is intact. The collateral ligaments of the metatarsophalangeal joints are intact. Muscles and Tendons Marked tendinosis of the flexor hallucis longus tendon without focal tear. The additional forefoot tendons appear intact. Probable extensor tenosynovitis within the 4th toe. Soft tissues As above, skin ulceration and a probable sinus tract dorsally in the 4th toe, extending to the underlying osseous abnormalities described above. No other sinus tract or focal fluid collections are identified. IMPRESSION: 1. As seen on prior radiographs, there are findings in the 4th toe which are consistent with osteomyelitis. There is cortical destruction of the middle and distal phalanges as well as mild T2 hyperintensity within the head of the proximal phalanx. Associated dorsal skin ulceration and small sinus tract. 2. Less specific marrow changes within the proximal and middle phalanges of the  2nd toe, also potentially osteomyelitis, but without apparent confirmatory adjacent soft tissue abnormality. 3. Ankylosed interphalangeal joint of the great toe. Underlying flexor hallucis longus tendinosis. Electronically Signed   By: Carey Bullocks M.D.   On: 07/04/2021 13:55   US ARTERIAL ABI (SCREENING LOWER  EXTREMITY)  Result Date: 07/05/2021 CLINICAL DATA:  65 year old male with bilateral lower extremity ulcers. EXAM: NONINVASIVE PHYSIOLOGIC VASCULAR STUDY OF BILATERAL LOWER EXTREMITIES TECHNIQUE: Evaluation of both lower extremities were performed at rest, including calculation of ankle-brachial indices with single level Doppler, pressure and pulse volume recording. COMPARISON:  None. FINDINGS: Right ABI:  1.21 Left ABI:  1.16 Right Lower Extremity:  Normal arterial waveforms at the ankle. Left Lower Extremity:  Normal arterial waveforms at the ankle. IMPRESSION: Normal ankle-brachial indices which could be falsely elevated. Arterial duplex or CTA runoff could be considered for further characterization/presence of lower extremity peripheral artery disease. Marliss Coots, MD [facility] Radiology Electronically Signed   By: Marliss Coots M.D.   On: 07/05/2021 14:10    Anti-infectives: Anti-infectives (From admission, onward)    Start     Dose/Rate Route Frequency Ordered Stop   07/06/21 1059  vancomycin variable dose per unstable renal function (pharmacist dosing)         Does not apply See admin instructions 07/06/21 1059     07/05/21 1600  vancomycin (VANCOREADY) IVPB 1500 mg/300 mL  Status:  Discontinued        1,500 mg 150 mL/hr over 120 Minutes Intravenous Every 12 hours 07/05/21 1529 07/06/21 1059   07/05/21 0200  vancomycin (VANCOREADY) IVPB 1500 mg/300 mL  Status:  Discontinued        1,500 mg 150 mL/hr over 120 Minutes Intravenous Every 12 hours 07/04/21 1331 07/05/21 1529   07/04/21 1430  metroNIDAZOLE (FLAGYL) IVPB 500 mg        500 mg 100 mL/hr over 60 Minutes Intravenous Every 12 hours 07/04/21 1251     07/04/21 1400  cefTRIAXone (ROCEPHIN) 2 g in sodium chloride 0.9 % 100 mL IVPB        2 g 200 mL/hr over 30 Minutes Intravenous Every 24 hours 07/04/21 1251     07/04/21 1215  vancomycin (VANCOCIN) IVPB 1000 mg/200 mL premix       See  Hyperspace for full Linked Orders Report.   1,000 mg 200 mL/hr over 60 Minutes Intravenous  Once 07/04/21 1105 07/04/21 1534   07/04/21 1115  vancomycin (VANCOCIN) IVPB 1000 mg/200 mL premix       See Hyperspace for full Linked Orders Report.   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  Plan: Betadine and sterile bandage applied to the amputation site as well as the ulceration on the second toe.  No culture results could be obtained from previous culture taken outpatient.  Discussed with the patient that hopefully he can be discharged on oral antibiotics.  At this point I would recommend waiting until tomorrow for discharge for another day of IV antibiotics and I will reassess the foot tomorrow prior to discharge.  LOS: 2 days    Ricci Barker 07/06/2021

## 2021-07-06 NOTE — Plan of Care (Signed)
Patient sleeping between care. Aox4. No complaints of pain or discomfort. Denies nausea/vomiting. Surgical dressing C/D/I. RLE elevated on pillow. Plan of care reviewed with patient. Call bell within reach.  PLAN OF CARE ONGOING Problem: Education: Goal: Knowledge of General Education information will improve Description: Including pain rating scale, medication(s)/side effects and non-pharmacologic comfort measures Outcome: Progressing   Problem: Health Behavior/Discharge Planning: Goal: Ability to manage health-related needs will improve Outcome: Progressing   Problem: Clinical Measurements: Goal: Ability to maintain clinical measurements within normal limits will improve Outcome: Progressing Goal: Will remain free from infection Outcome: Progressing Goal: Diagnostic test results will improve Outcome: Progressing Goal: Respiratory complications will improve Outcome: Progressing Goal: Cardiovascular complication will be avoided Outcome: Progressing   Problem: Activity: Goal: Risk for activity intolerance will decrease Outcome: Progressing   Problem: Nutrition: Goal: Adequate nutrition will be maintained Outcome: Progressing   Problem: Coping: Goal: Level of anxiety will decrease Outcome: Progressing   Problem: Elimination: Goal: Will not experience complications related to bowel motility Outcome: Progressing Goal: Will not experience complications related to urinary retention Outcome: Progressing   Problem: Pain Managment: Goal: General experience of comfort will improve Outcome: Progressing   Problem: Safety: Goal: Ability to remain free from injury will improve Outcome: Progressing   Problem: Skin Integrity: Goal: Risk for impaired skin integrity will decrease Outcome: Progressing

## 2021-07-06 NOTE — Evaluation (Signed)
Physical Therapy Evaluation Patient Details Name: Alexander Gill MRN: [id number] DOB: 06/06/1956 Today's Date: 07/06/2021  History of Present Illness  Pt admitted to [facility] on 07/04/21 for concern of R foot infection. 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 Alberteen Spindle. Significant PMH includes: DM, cataracts, HTN, hypothyroidism.   Clinical Impression  Pt is a 65 year old M admitted to hospital on 07/04/21 for R foot infection. At baseline, pt is Ind with all ADL's, IADL's, community ambulation without AD, driving, and working part time. Pt presents with adequate strength, balance, endurance, and safety awareness required for safe Ind functional mobility. Was grossly Ind-Mod I for bed mobility, transfers, and gait with IV pole. Pt able to maintain heel WB precautions in surgical shoe without cueing, and able to recall precautions. Pt appropriate for Ind ambulation in room and limited hallway ambulation with/without nursing. Pt near baseline and is Ind with mobility, therefore is not an appropriate candidate for skilled acute PT services at this time. PT to sign off. Re-consult with any changes in status. Recommend DC home with no further therapy/DME needs.        Recommendations for follow up therapy are one component of a multi-disciplinary discharge planning process, led by the attending physician.  Recommendations may be updated based on patient status, additional functional criteria and insurance authorization.  Follow Up Recommendations No PT follow up    Assistance Recommended at Discharge PRN  Functional Status Assessment Patient has had a recent decline in their functional status and demonstrates the ability to make significant improvements in function in a reasonable and predictable amount of time.   Equipment Recommendations  None recommended by PT       Precautions / Restrictions Restrictions Weight Bearing Restrictions: Yes RLE Weight Bearing: Weight bearing as tolerated (heel WB  in surgical shoe)      Mobility  Bed Mobility Overal bed mobility: Independent                  Transfers Overall transfer level: Modified independent Equipment used: None               General transfer comment: EOB with BUE for support    Ambulation/Gait Ambulation/Gait assistance: Modified independent (Device/Increase time) Gait Distance (Feet): 160 Feet Assistive device: IV Pole         General Gait Details: early reciprocal gait pattern, maintaining heel WB. No LOB noted with head turns, able to ambulate backwards without LOB     Balance Overall balance assessment: Modified Independent                                           Pertinent Vitals/Pain Pain Assessment: No/denies pain    Home Living Family/patient expects to be discharged to:: Private residence Living Arrangements: Spouse/significant other Available Help at Discharge: Family;Available 24 hours/day Type of Home: House Home Access: Stairs to enter   [employer] of Steps: 2 small steps   Home Layout: Two level;Able to live on main level with bedroom/bathroom   Additional Comments: knee scooter    Prior Function Prior Level of Function : Independent/Modified Independent;Working/employed;Driving             Mobility Comments: No AD for community ambulation ADLs Comments: Ind ADL's, splits IADL's with spouse     Extremity/Trunk Assessment   Upper Extremity Assessment Upper Extremity Assessment: Overall WFL for tasks  assessed (grossly 5/5; sensation intact)    Lower Extremity Assessment Lower Extremity Assessment: Overall WFL for tasks assessed (grossly 5/5; diminished sensation L4-5 bil secondary to baseline neuropathy)    Cervical / Trunk Assessment Cervical / Trunk Assessment: Normal  Communication   Communication: No difficulties  Cognition Arousal/Alertness: Awake/alert Behavior During Therapy: WFL for tasks assessed/performed Overall  Cognitive Status: Within Functional Limits for tasks assessed                                 General Comments: A&O x4; follows 100% of multistep commands        General Comments General comments (skin integrity, edema, etc.): R foot in ACE bandage; no strikethrough noted    Exercises Other Exercises Other Exercises: Participated in bed mobility, transfers, and gait. Grossly Mod I with IV pole for steadying. Other Exercises: Pt educated re: PT role/POC, DC recommendations, safety with mobility, heel WB precautions and importance for wound integrity, Ind ambulation in room, ambulation in hallway with/without nursing. He verbalized understanding.   Assessment/Plan    PT Assessment Patient does not need any further PT services         PT Goals (Current goals can be found in the Care Plan section)  Acute Rehab PT Goals Patient Stated Goal: "go home" PT Goal Formulation: With patient Time For Goal Achievement: 07/20/21 Potential to Achieve Goals: Good     AM-PAC PT "6 Clicks" Mobility  Outcome Measure Help needed turning from your back to your side while in a flat bed without using bedrails?: None Help needed moving from lying on your back to sitting on the side of a flat bed without using bedrails?: None Help needed moving to and from a bed to a chair (including a wheelchair)?: None Help needed standing up from a chair using your arms (e.g., wheelchair or bedside chair)?: None Help needed to walk in hospital room?: None Help needed climbing 3-5 steps with a railing? : None 6 Click Score: 24    End of Session Equipment Utilized During Treatment: Gait belt Activity Tolerance: Patient tolerated treatment well Patient left: in chair;with call bell/phone within reach (no chair alarm needed; pt safe for Ind ambulation in room) Nurse Communication: Mobility status PT Visit Diagnosis: Unsteadiness on feet (R26.81)    Time: 2993-7169 PT Time Calculation (min) (ACUTE  ONLY): 14 min   Charges:   PT Evaluation $PT Eval Low Complexity: 1 Low         Vira Blanco, PT, DPT 1:56 PM,07/06/21

## 2021-07-07 LAB — CBC WITH DIFFERENTIAL/PLATELET
Abs Immature Granulocytes: 0.05 10*3/uL (ref 0.00–0.07)
Basophils Absolute: 0.1 10*3/uL (ref 0.0–0.1)
Basophils Relative: 1 %
Eosinophils Absolute: 0.5 10*3/uL (ref 0.0–0.5)
Eosinophils Relative: 4 %
HCT: 40.3 % (ref 39.0–52.0)
Hemoglobin: 13.3 g/dL (ref 13.0–17.0)
Immature Granulocytes: 1 %
Lymphocytes Relative: 16 %
Lymphs Abs: 1.6 10*3/uL (ref 0.7–4.0)
MCH: 28.9 pg (ref 26.0–34.0)
MCHC: 33 g/dL (ref 30.0–36.0)
MCV: 87.4 fL (ref 80.0–100.0)
Monocytes Absolute: 0.9 10*3/uL (ref 0.1–1.0)
Monocytes Relative: 9 %
Neutro Abs: 7.4 10*3/uL (ref 1.7–7.7)
Neutrophils Relative %: 69 %
Platelets: 283 10*3/uL (ref 150–400)
RBC: 4.61 MIL/uL (ref 4.22–5.81)
RDW: 13 % (ref 11.5–15.5)
WBC: 10.5 10*3/uL (ref 4.0–10.5)
nRBC: 0 % (ref 0.0–0.2)

## 2021-07-07 LAB — PHOSPHORUS: Phosphorus: 2.8 mg/dL (ref 2.5–4.6)

## 2021-07-07 LAB — COMPREHENSIVE METABOLIC PANEL
ALT: 16 U/L (ref 0–44)
AST: 16 U/L (ref 15–41)
Albumin: 3.1 g/dL — ABNORMAL LOW (ref 3.5–5.0)
Alkaline Phosphatase: 59 U/L (ref 38–126)
Anion gap: 6 (ref 5–15)
BUN: 11 mg/dL (ref 8–23)
CO2: 26 mmol/L (ref 22–32)
Calcium: 8.4 mg/dL — ABNORMAL LOW (ref 8.9–10.3)
Chloride: 108 mmol/L (ref 98–111)
Creatinine, Ser: 0.63 mg/dL (ref 0.61–1.24)
GFR, Estimated: >60 mL/min (ref >60)
Glucose, Bld: 183 mg/dL — ABNORMAL HIGH (ref 70–99)
Potassium: 3.7 mmol/L (ref 3.5–5.1)
Sodium: 140 mmol/L (ref 135–145)
Total Bilirubin: 0.6 mg/dL (ref 0.3–1.2)
Total Protein: 6.8 g/dL (ref 6.5–8.1)

## 2021-07-07 LAB — GLUCOSE, CAPILLARY
Glucose-Capillary: 176 mg/dL — ABNORMAL HIGH (ref 70–99)
Glucose-Capillary: 255 mg/dL — ABNORMAL HIGH (ref 70–99)

## 2021-07-07 LAB — MAGNESIUM: Magnesium: 2.3 mg/dL (ref 1.7–2.4)

## 2021-07-07 MED ORDER — DOXYCYCLINE HYCLATE 100 MG PO CAPS: 100.0000 mg | ORAL_CAPSULE | Freq: Two times a day (BID) | ORAL | 0 refills | Status: AC

## 2021-07-07 MED ORDER — AMOXICILLIN-POT CLAVULANATE 875-125 MG PO TABS: 1.0000 | ORAL_TABLET | Freq: Two times a day (BID) | ORAL | 0 refills | Status: AC

## 2021-07-07 NOTE — Progress Notes (Signed)
2 Days Post-Op   Subjective/Chief Complaint: Patient seen.  No complaints.   Objective: Vital signs in last 24 hours: Temp:  [97.9 F (36.6 C)-98.8 F (37.1 C)] 97.9 F (36.6 C) (12/19 0735) Pulse Rate:  [54-59] 55 (12/19 0735) Resp:  [16-18] 17 (12/19 0735) BP: (124-164)/(64-83) 164/79 (12/19 0735) SpO2:  [96 %-100 %] 100 % (12/19 0735) Last BM Date: 07/06/21  Intake/Output from previous day: 12/18 0701 - 12/19 0700 In: 1400 [P.O.:600; IV Piggyback:800] Out: -  Intake/Output this shift: No intake/output data recorded.  Some mild to moderate bleeding noted on the bandaging.  Patient relates being up and walking yesterday.  Incision is well coapted and skin edges still appear viable.  No purulence or expressible fluid from the wound area.  Lab Results:  Recent Labs    07/06/21 0346 07/07/21 0518  WBC 10.0 10.5  HGB 11.9* 13.3  HCT 35.9* 40.3  PLT 265 283   BMET Recent Labs    07/06/21 0346 07/07/21 0518  NA 136 140  K 3.6 3.7  CL 107 108  CO2 24 26  GLUCOSE 182* 183*  BUN 18 11  CREATININE 0.76 0.63  CALCIUM 8.2* 8.4*   PT/INR Recent Labs    07/04/21 1402  LABPROT 13.3  INR 1.0   ABG No results for input(s): PHART, HCO3 in the last 72 hours.  Invalid input(s): PCO2, PO2  Studies/Results: US ARTERIAL ABI (SCREENING LOWER EXTREMITY)  Result Date: 07/05/2021 CLINICAL DATA:  65 year old male with bilateral lower extremity ulcers. EXAM: NONINVASIVE PHYSIOLOGIC VASCULAR STUDY OF BILATERAL LOWER EXTREMITIES TECHNIQUE: Evaluation of both lower extremities were performed at rest, including calculation of ankle-brachial indices with single level Doppler, pressure and pulse volume recording. COMPARISON:  None. FINDINGS: Right ABI:  1.21 Left ABI:  1.16 Right Lower Extremity:  Normal arterial waveforms at the ankle. Left Lower Extremity:  Normal arterial waveforms at the ankle. IMPRESSION: Normal ankle-brachial indices which could be falsely elevated. Arterial  duplex or CTA runoff could be considered for further characterization/presence of lower extremity peripheral artery disease. Marliss Coots, MD [facility] Electronically Signed   By: Marliss Coots M.D.   On: 07/05/2021 14:10    Anti-infectives: Anti-infectives (From admission, onward)    Start     Dose/Rate Route Frequency Ordered Stop   07/06/21 1700  vancomycin (VANCOREADY) IVPB 1500 mg/300 mL        1,500 mg 150 mL/hr over 120 Minutes Intravenous Every 12 hours 07/06/21 1531     07/06/21 1059  vancomycin variable dose per unstable renal function (pharmacist dosing)  Status:  Discontinued         Does not apply See admin instructions 07/06/21 1059 07/06/21 1531   07/05/21 1600  vancomycin (VANCOREADY) IVPB 1500 mg/300 mL  Status:  Discontinued        1,500 mg 150 mL/hr over 120 Minutes Intravenous Every 12 hours 07/05/21 1529 07/06/21 1059   07/05/21 0200  vancomycin (VANCOREADY) IVPB 1500 mg/300 mL  Status:  Discontinued        1,500 mg 150 mL/hr over 120 Minutes Intravenous Every 12 hours 07/04/21 1331 07/05/21 1529   07/04/21 1430  metroNIDAZOLE (FLAGYL) IVPB 500 mg        500 mg 100 mL/hr over 60 Minutes Intravenous Every 12 hours 07/04/21 1251     07/04/21 1400  cefTRIAXone (ROCEPHIN) 2 g in sodium chloride 0.9 % 100 mL IVPB        2 g 200  mL/hr over 30 Minutes Intravenous Every 24 hours 07/04/21 1251     07/04/21 1215  vancomycin (VANCOCIN) IVPB 1000 mg/200 mL premix       See Hyperspace for full Linked Orders Report.   1,000 mg 200 mL/hr over 60 Minutes Intravenous  Once 07/04/21 1105 07/04/21 1534   07/04/21 1115  vancomycin (VANCOCIN) IVPB 1000 mg/200 mL premix       See Hyperspace for full Linked Orders Report.   1,000 mg 200 mL/hr over 60 Minutes Intravenous  Once 07/04/21 1105 07/04/21 1423   07/04/21 1100  piperacillin-tazobactam (ZOSYN) IVPB 3.375 g        3.375 g 100 mL/hr over 30 Minutes Intravenous  Once  07/04/21 1059 07/04/21 1219       Assessment/Plan: s/p Procedure(s): AMPUTATION RIGHT 4TH TOE (Right) Assessment: Stable status post amputation right fourth toe.  Plan: Betadine and a sterile bandage applied to the incision area at the fourth toe amputation site.  A second Betadine gauze was applied to the second toe and the patient was instructed how to change this daily while leaving the other bandage intact.  Continue in the surgical shoe with pressure only on the heel with minimal walking at this point.  Patient should be stable for discharge on oral antibiotics.  Would recommend Augmentin for a 10-day course and may also want to give Cipro for dual coverage until his cultures come back.  Plan for follow-up outpatient in the office in 1 week.  LOS: 3 days    Alexander Gill 07/07/2021

## 2021-07-07 NOTE — TOC Progression Note (Signed)
Transition of Care [facility]) - Progression Note    Patient Details  Name: Alexander Gill MRN: [id number] Date of Birth: April 19, 1956  Transition of Care [facility]) CM/SW Contact  Marlowe Sax, RN Phone Number: 07/07/2021, 10:16 AM  Clinical Narrative:   Patient from home with spouse,  At baseline, pt is Independent with all ADL's, IADL's, community ambulation without Assistive device, still driving, and working part time,  NO Home health recommended and no DME recommended, she was able to ambulate 160 Ft        Expected Discharge Plan and Services                                                 Social Determinants of Health (SDOH) Interventions    Readmission Risk Interventions No flowsheet data found.

## 2021-07-07 NOTE — Discharge Summary (Signed)
Physician Discharge Summary  Alexander Gill YTK:[id number] DOB: 1956-01-31 DOA: 07/04/2021  PCP: Maia Petties, MD  Admit date: 07/04/2021 Discharge date: 07/07/2021  Time spent: 40 minutes  Recommendations for Outpatient Follow-up:  Follow outpatient CBC/CMP Follow with podiatry outpatient Follow final culture and susceptibilities - currently showing moderate staph aureus Follow with podiatry for concern for 2nd toe osteo, they'll decide on need for intervention vs extended course of abx for osteo  US arterial ABI with normal ABI, but could be falsely elevated - consider arterial duplex or CTA runoff with PCP outpatient   Discharge Diagnoses:  Principal Problem:   Osteomyelitis of toe of right foot (HCC) Active Problems:   Diabetes mellitus without complication (HCC)   Hypertension   Hypothyroidism   HLD (hyperlipidemia)   Discharge Condition: stable  Diet recommendation: diabetic diet, heart healthy  Filed Weights   07/04/21 0804  Weight: 88.5 kg    History of present illness:  Yahye Siebert is Shamon Lobo 65 y.o. male with medical history significant of hypertension, hyperlipidemia, diabetes mellitus, hypothyroidism, who presents with right foot fourth toe infection.  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  Culture growing staph, he'll be discharged on augmentin/doxy with podiatry follow up.    See below for additional details  Hospital Course:  * Osteomyelitis of toe of right foot (HCC) MRI with 4th toe osteo - potentially changes c/w osteomyelitis in 2nd toe Now s/p amputation of R 4th toe on 12/17 with podiatry ABI with normal ankle brachial indices (could be falsely elevated per report), he has palpable pulses - follow with PCP to consider arterial duplex or CTA runoff outpatient  Continue vanc/ceftriaxone/flagyl -> narrow to doxy/augmentin x14 days at discharge - podiatry will reevaluate 2nd toe and need for intervention vs extending abx  outpatient (discussed with podiatry) Appreciate podiatry recs - they're recommending follow up in 1 week, surgical shoe, pressure only on heel   Diabetes mellitus without complication (HCC) Resume home regimen  Hypertension atenolol  Hypothyroidism synthroid  HLD (hyperlipidemia) crestor   Procedures: Amputatino right 4th toe metatarsal phalangeal joint   Consultations: podiatry  Discharge Exam: Vitals:   07/07/21 0735 07/07/21 1124  BP: (!) 164/79 (!) 154/67  Pulse: (!) 55 (!) 52  Resp: 17 20  Temp: 97.9 F (36.6 C) 97.9 F (36.6 C)  SpO2: 100% 98%   Eager for discharge No complaints Discussed d/c plan together  General: No acute distress. Lungs: unlabored Neurological: Alert and oriented 3. Moves all extremities 4 . Cranial nerves II through XII grossly intact. Skin: Warm and dry. No rashes or lesions. Extremities: surgical shoe on R foot, sock in place  Discharge Instructions   Discharge Instructions     Call MD for:  difficulty breathing, headache or visual disturbances   Complete by: As directed    Call MD for:  extreme fatigue   Complete by: As directed    Call MD for:  hives   Complete by: As directed    Call MD for:  persistant dizziness or light-headedness   Complete by: As directed    Call MD for:  persistant nausea and vomiting   Complete by: As directed    Call MD for:  redness, tenderness, or signs of infection (pain, swelling, redness, odor or green/yellow discharge around incision site)   Complete by: As directed    Call MD for:  severe uncontrolled pain   Complete by: As directed    Call MD for:  temperature >  100.4   Complete by: As directed    Diet - low sodium heart healthy   Complete by: As directed    Discharge instructions   Complete by: As directed    You were seen for Lativia Velie bone infection.  You had surgery with podiatry.  There's possible bone infection of your 2nd toe.  Podiatry will follow this wound and infection up with  you and determine if it needs surgical management or prolonged antibiotics.  You had Viral Schramm arterial study that showed normal flow, but could be falsely elevated.  Please discuss with your PCP to decide whether to pursue additional studies outpatient or follow up with vascular.  Return for new, recurrent, or worsening symptoms.  Please ask your PCP to request records from this hospitalization so they know what was done and what the next steps will be.   Discharge wound care:   Complete by: As directed    As directed by podiatry   Increase activity slowly   Complete by: As directed       Allergies as of 07/07/2021   No Known Allergies      Medication List     TAKE these medications    amoxicillin-clavulanate 875-125 MG tablet Commonly known as: Augmentin Take 1 tablet by mouth 2 (two) times daily for 14 days.   aspirin EC 81 MG tablet Take 81 mg by mouth daily. Swallow whole.   atenolol 25 MG tablet Commonly known as: TENORMIN Take 25 mg by mouth daily.   cholecalciferol 25 MCG (1000 UNIT) tablet Commonly known as: VITAMIN D3 Take 1,000 Units by mouth daily.   doxycycline 100 MG capsule Commonly known as: VIBRAMYCIN Take 1 capsule (100 mg total) by mouth 2 (two) times daily for 14 days.   Lantus 100 UNIT/ML injection Generic drug: insulin glargine Inject 30 Units into the skin daily.   levothyroxine 25 MCG tablet Commonly known as: SYNTHROID Take 25 mcg by mouth daily.   losartan-hydrochlorothiazide 100-25 MG tablet Commonly known as: HYZAAR Take 1 tablet by mouth every morning.   repaglinide 1 MG tablet Commonly known as: PRANDIN Take 1 mg by mouth daily. Take 30 minutes prior to dinner.   rosuvastatin 20 MG tablet Commonly known as: CRESTOR Take 20 mg by mouth daily.   Xigduo XR 11-998 MG Tb24 Generic drug: Dapagliflozin-metFORMIN HCl ER Take 1 tablet by mouth daily at 2 am.               Discharge Care Instructions  (From admission, onward)            Start     Ordered   07/07/21 0000  Discharge wound care:       Comments: As directed by podiatry   07/07/21 1324           No Known Allergies  Follow-up Information     Linus Galas, DPM. Schedule an appointment as soon as possible for Ammarie Matsuura visit in 1 week(s).   Specialty: Podiatry Contact information: [address] [place] [address] [phone]         Linus Galas, DPM .   Specialty: Podiatry Contact information: [address] [place] [address] [phone]                  The results of significant diagnostics from this hospitalization (including imaging, microbiology, ancillary and laboratory) are listed below for reference.    Significant Diagnostic Studies: MR FOOT RIGHT WO CONTRAST  Result Date: 07/04/2021 CLINICAL  DATA:  Osteomyelitis, foot. Fourth toe swelling. History of diabetes and neuropathy. Ankle twisting injury 2 days ago. EXAM: MRI OF THE RIGHT FOREFOOT WITHOUT CONTRAST TECHNIQUE: Multiplanar, multisequence MR imaging of the right forefoot was performed. No intravenous contrast was administered. COMPARISON:  Foot radiographs 07/04/2021. Left forefoot MRI 01/16/2021. FINDINGS: Bones/Joint/Cartilage As seen on prior radiographs, there is cortical destruction of the middle and distal phalanges of the 4th toe surrounding the distal interphalangeal joint, highly suspicious for osteomyelitis. There is associated decreased T1 and increased T2 marrow signal, and interphalangeal joint effusion and an apparent sinus tract extending dorsally to the skin. Mild T2 hyperintensity is also present within the head of the proximal phalanx. There are less specific marrow changes within the proximal and middle phalanges of the 2nd toe, primarily on the T2 weighted images, but also involving the head of the proximal phalanx on the T1 weighted images. The proximal and distal phalanges of the great toe are ankylosed. The metatarsals and visualized  bones of the midfoot appear unremarkable. Ligaments The Lisfranc ligament is intact. The collateral ligaments of the metatarsophalangeal joints are intact. Muscles and Tendons Marked tendinosis of the flexor hallucis longus tendon without focal tear. The additional forefoot tendons appear intact. Probable extensor tenosynovitis within the 4th toe. Soft tissues As above, skin ulceration and Marleta Lapierre probable sinus tract dorsally in the 4th toe, extending to the underlying osseous abnormalities described above. No other sinus tract or focal fluid collections are identified. IMPRESSION: 1. As seen on prior radiographs, there are findings in the 4th toe which are consistent with osteomyelitis. There is cortical destruction of the middle and distal phalanges as well as mild T2 hyperintensity within the head of the proximal phalanx. Associated dorsal skin ulceration and small sinus tract. 2. Less specific marrow changes within the proximal and middle phalanges of the 2nd toe, also potentially osteomyelitis, but without apparent confirmatory adjacent soft tissue abnormality. 3. Ankylosed interphalangeal joint of the great toe. Underlying flexor hallucis longus tendinosis. Electronically Signed   By: Carey Bullocks M.D.   On: 07/04/2021 13:55   US ARTERIAL ABI (SCREENING LOWER EXTREMITY)  Result Date: 07/05/2021 CLINICAL DATA:  65 year old male with bilateral lower extremity ulcers. EXAM: NONINVASIVE PHYSIOLOGIC VASCULAR STUDY OF BILATERAL LOWER EXTREMITIES TECHNIQUE: Evaluation of both lower extremities were performed at rest, including calculation of ankle-brachial indices with single level Doppler, pressure and pulse volume recording. COMPARISON:  None. FINDINGS: Right ABI:  1.21 Left ABI:  1.16 Right Lower Extremity:  Normal arterial waveforms at the ankle. Left Lower Extremity:  Normal arterial waveforms at the ankle. IMPRESSION: Normal ankle-brachial indices which could be falsely elevated. Arterial duplex or CTA  runoff could be considered for further characterization/presence of lower extremity peripheral artery disease. Marliss Coots, MD [facility] Radiology Electronically Signed   By: Marliss Coots M.D.   On: 07/05/2021 14:10   DG Foot Complete Right  Result Date: 07/04/2021 CLINICAL DATA:  Right foot wound. EXAM: RIGHT FOOT COMPLETE - 3+ VIEW COMPARISON:  None. FINDINGS: Soft tissue swelling is seen involving the fourth toe concerning for cellulitis. Ulceration is seen involving the distal portion of the fourth toe. Lytic destruction is seen involving portions of the fourth middle and distal phalanges concerning for osteomyelitis. No other bony abnormality is noted. IMPRESSION: Ulceration is seen involving the fourth toe with underlying lytic destruction of the fourth middle and distal phalanges concerning for osteomyelitis. Electronically Signed   By: Lupita Raider M.D.   On:  07/04/2021 10:29    Microbiology: Recent Results (from the past 240 hour(s))  Blood culture (routine x 2)     Status: None (Preliminary result)   Collection Time: 07/04/21 10:56 AM   Specimen: BLOOD  Result Value Ref Range Status   Specimen Description BLOOD BLOOD RIGHT HAND  Final   Special Requests   Final    BOTTLES DRAWN AEROBIC AND ANAEROBIC Blood Culture adequate volume   Culture   Final    NO GROWTH 3 DAYS Performed at [facility], [address], [place], [place] [address]    Report Status PENDING  Incomplete  Blood culture (routine x 2)     Status: None (Preliminary result)   Collection Time: 07/04/21 11:01 AM   Specimen: BLOOD  Result Value Ref Range Status   Specimen Description BLOOD BLOOD RIGHT HAND  Final   Special Requests   Final    BOTTLES DRAWN AEROBIC AND ANAEROBIC Blood Culture adequate volume   Culture   Final    NO GROWTH 3 DAYS Performed at [facility], [address] [place], [place] [address]    Report Status PENDING   Incomplete  Resp Panel by RT-PCR (Flu Lareen Mullings&B, Covid) Nasopharyngeal Swab     Status: None   Collection Time: 07/04/21 12:25 PM   Specimen: Nasopharyngeal Swab; Nasopharyngeal(NP) swabs in vial transport medium  Result Value Ref Range Status   SARS Coronavirus 2 by RT PCR NEGATIVE NEGATIVE Final    Comment: (NOTE) SARS-CoV-2 target nucleic acids are NOT DETECTED.  The SARS-CoV-2 RNA is generally detectable in upper respiratory specimens during the acute phase of infection. The lowest concentration of SARS-CoV-2 viral copies this assay can detect is 138 copies/mL. Caci Orren negative result does not preclude SARS-Cov-2 infection and should not be used as the sole basis for treatment or other patient management decisions. Doye Montilla negative result may occur with  improper specimen collection/handling, submission of specimen other than nasopharyngeal swab, presence of viral mutation(s) within the areas targeted by this assay, and inadequate number of viral copies(<138 copies/mL). Taronda Comacho negative result must be combined with clinical observations, patient history, and epidemiological information. The expected result is Negative.  Fact Sheet for Patients:  BloggerCourse.com  Fact Sheet for Healthcare Providers:  SeriousBroker.it  This test is no t yet approved or cleared by the [place] FDA and  has been authorized for detection and/or diagnosis of SARS-CoV-2 by FDA under an Emergency Use Authorization (EUA). This EUA will remain  in effect (meaning this test can be used) for the duration of the COVID-19 declaration under Section 564(b)(1) of the Act, 21 U.S.C.section 360bbb-3(b)(1), unless the authorization is terminated  or revoked sooner.       Influenza Damyra Luscher by PCR NEGATIVE NEGATIVE Final   Influenza B by PCR NEGATIVE NEGATIVE Final    Comment: (NOTE) The Xpert Xpress SARS-CoV-2/FLU/RSV plus assay is intended as an aid in the diagnosis of influenza  from Nasopharyngeal swab specimens and should not be used as Myna Freimark sole basis for treatment. Nasal washings and aspirates are unacceptable for Xpert Xpress SARS-CoV-2/FLU/RSV testing.  Fact Sheet for Patients: BloggerCourse.com  Fact Sheet for Healthcare Providers: SeriousBroker.it  This test is not yet approved or cleared by the [place] FDA and has been authorized for detection and/or diagnosis of SARS-CoV-2 by FDA under an Emergency Use Authorization (EUA). This EUA will remain in effect (meaning this test can be used) for the duration of the COVID-19 declaration under Section 564(b)(1) of the Act, 21  U.S.C. section 360bbb-3(b)(1), unless the authorization is terminated or revoked.  Performed at [facility], [address] [place], [place] [address]   Aerobic/Anaerobic Culture w Gram Stain (surgical/deep wound)     Status: None (Preliminary result)   Collection Time: 07/05/21  6:26 PM   Specimen: PATH Bone biopsy; Tissue  Result Value Ref Range Status   Specimen Description   Final    BONE 2nd TOE RIGHT FOOT Performed at [facility], [address] [place], [place] [address]    Special Requests   Final    NONE Performed at [facility], [address] [place], [place] [address]    Gram Stain   Final    NO SQUAMOUS EPITHELIAL CELLS SEEN NO WBC SEEN NO ORGANISMS SEEN    Culture   Final    MODERATE STAPHYLOCOCCUS AUREUS SUSCEPTIBILITIES TO FOLLOW Performed at [facility], [address] [place], [place] [address]    Report Status PENDING  Incomplete     Labs: Basic Metabolic Panel: Recent Labs  Lab 07/04/21 0812 07/05/21 0342 07/06/21 0346 07/07/21 0518  NA 136  --  136 140  K 3.5  --  3.6 3.7  CL 101  --  107 108  CO2 27  --  24 26  GLUCOSE 173*  --  182* 183*  BUN 17  --  18 11  CREATININE 0.72 0.87 0.76 0.63  CALCIUM 9.1  --  8.2* 8.4*  MG  --   --  2.2 2.3   PHOS  --   --  3.2 2.8   Liver Function Tests: Recent Labs  Lab 07/06/21 0346 07/07/21 0518  AST 12* 16  ALT 15 16  ALKPHOS 43 59  BILITOT 0.5 0.6  PROT 6.3* 6.8  ALBUMIN 2.7* 3.1*   No results for input(s): LIPASE, AMYLASE in the last 168 hours. No results for input(s): AMMONIA in the last 168 hours. CBC: Recent Labs  Lab 07/04/21 0812 07/05/21 0342 07/06/21 0346 07/07/21 0518  WBC 12.5* 10.6* 10.0 10.5  NEUTROABS  --   --  6.9 7.4  HGB 15.1 12.5* 11.9* 13.3  HCT 45.6 37.6* 35.9* 40.3  MCV 87.7 86.6 86.7 87.4  PLT 331 276 265 283   Cardiac Enzymes: No results for input(s): CKTOTAL, CKMB, CKMBINDEX, TROPONINI in the last 168 hours. BNP: BNP (last 3 results) No results for input(s): BNP in the last 8760 hours.  ProBNP (last 3 results) No results for input(s): PROBNP in the last 8760 hours.  CBG: Recent Labs  Lab 07/06/21 1130 07/06/21 1600 07/06/21 2049 07/07/21 0735 07/07/21 1125  GLUCAP 276* 220* 277* 176* 255*       Signed:  Lacretia Nicks MD.  [facility] 07/07/2021, 1:31 PM

## 2021-07-07 NOTE — Progress Notes (Signed)
Pharmacy Antibiotic Note  Alexander Gill is a 65 y.o. male admitted on 07/04/2021. Noted that patient was seen by Dr. Excell Seltzer in podiatry yesterday (12/15).  At that time they wanted patient to be admitted for IV antibiotics likely surgical /debridement and potential amputation. Pharmacy has been consulted for vancomycin dosing for R foot osteomyelitis.  Plan: Day 4  vancomycin 1500 mg q12h Est AUC: 526 Used: Scr 0.8 (rounded up), IBW, Vd 0.72 Obtain vanc levels around 4th or 5th dose if continued Monitor renal function and adjust dose as clinically indicated   Height: 6' (182.9 cm) Weight: 88.5 kg (195 lb) IBW/kg (Calculated) : 77.6  Temp (24hrs), Avg:98.3 F (36.8 C), Min:97.9 F (36.6 C), Max:98.8 F (37.1 C)  Recent Labs  Lab 07/04/21 0812 07/05/21 0342 07/06/21 0346 07/06/21 1504 07/07/21 0518  WBC 12.5* 10.6* 10.0  --  10.5  CREATININE 0.72 0.87 0.76  --  0.63  LATICACIDVEN 1.3  --   --   --   --   VANCORANDOM  --   --   --  12  --      Estimated Creatinine Clearance: 102.4 mL/min (by C-G formula based on SCr of 0.63 mg/dL).    No Known Allergies  Antimicrobials this admission: 12/16 Zosyn x1 12/16 Metronidazole >>  12/16 Ceftriaxone >>  12/16 Vancomycin >>    Microbiology results: 12/16 BCx: NG x 3d 12/17 Bone cx: pending  Thank you for allowing pharmacy to be a part of this patients care.  Mariann Palo A 07/07/2021 8:42 AM

## 2021-07-07 NOTE — Progress Notes (Signed)
Patient transported off unit for discharge.  Currently no signs of acute distress.

## 2021-07-07 NOTE — Progress Notes (Signed)
Inpatient Diabetes Program Recommendations  AACE/ADA: New Consensus Statement on Inpatient Glycemic Control (2015)  Target Ranges:  Prepandial:   less than 140 mg/dL      Peak postprandial:   less than 180 mg/dL (1-2 hours)      Critically ill patients:  140 - 180 mg/dL   Lab Results  Component Value Date   GLUCAP 176 (H) 07/07/2021   HGBA1C 8.6 (H) 01/15/2021    Review of Glycemic Control  Latest Reference Range & Units 07/06/21 08:29 07/06/21 11:30 07/06/21 16:00 07/06/21 20:49 07/07/21 07:35  Glucose-Capillary 70 - 99 mg/dL 106 (H) 269 (H) 485 (H) 277 (H) 176 (H)   Diabetes history: DM 2 Outpatient Diabetes medications: Lantus 30 units Daily, Prandin 1 mg Daily before supper, Xigduo 11-998 Daily Current orders for Inpatient glycemic control:  Semglee 20 units qhs Novolog 0-9 units tid + hs  Inpatient Diabetes Program Recommendations:    -  Increase Semglee to 22-24 units -  Consider Novolog 2 units tid meal coverage if eating >50% of meals  Thanks,  Christena Deem RN, MSN, BC-ADM Inpatient Diabetes Coordinator Team Pager [phone] (8a-5p)

## 2021-07-09 LAB — CULTURE, BLOOD (ROUTINE X 2)
Culture: NO GROWTH
Culture: NO GROWTH
Special Requests: ADEQUATE
Special Requests: ADEQUATE

## 2021-07-09 LAB — SURGICAL PATHOLOGY

## 2021-07-11 LAB — AEROBIC/ANAEROBIC CULTURE W GRAM STAIN (SURGICAL/DEEP WOUND): Gram Stain: NONE SEEN

## 2021-07-20 DIAGNOSIS — Z22322 Carrier or suspected carrier of Methicillin resistant Staphylococcus aureus: Secondary | ICD-10-CM

## 2021-07-20 HISTORY — DX: Carrier or suspected carrier of methicillin resistant Staphylococcus aureus: Z22.322

## 2021-10-21 ENCOUNTER — Other Ambulatory Visit: Payer: Self-pay

## 2021-10-21 ENCOUNTER — Inpatient Hospital Stay: Payer: BC Managed Care – PPO

## 2021-10-21 ENCOUNTER — Other Ambulatory Visit
Admission: RE | Admit: 2021-10-21 | Discharge: 2021-10-21 | Disposition: A | Discharge status: Home / Self Care | Payer: No Typology Code available for payment source | Source: Ambulatory Visit | Attending: Podiatry | Admitting: Podiatry

## 2021-10-21 ENCOUNTER — Emergency Department: Payer: BC Managed Care – PPO

## 2021-10-21 ENCOUNTER — Inpatient Hospital Stay
Admission: EM | Admit: 2021-10-21 | Discharge: 2021-10-24 | DRG: 240 | Disposition: A | Discharge status: Home / Self Care | Payer: BC Managed Care – PPO | Attending: Internal Medicine | Admitting: Internal Medicine

## 2021-10-21 DIAGNOSIS — Z833 Family history of diabetes mellitus: Secondary | ICD-10-CM | POA: Diagnosis not present

## 2021-10-21 DIAGNOSIS — L03115 Cellulitis of right lower limb: Secondary | ICD-10-CM | POA: Diagnosis present

## 2021-10-21 DIAGNOSIS — E785 Hyperlipidemia, unspecified: Secondary | ICD-10-CM | POA: Diagnosis present

## 2021-10-21 DIAGNOSIS — Z79890 Hormone replacement therapy (postmenopausal): Secondary | ICD-10-CM

## 2021-10-21 DIAGNOSIS — M86179 Other acute osteomyelitis, unspecified ankle and foot: Secondary | ICD-10-CM | POA: Diagnosis not present

## 2021-10-21 DIAGNOSIS — Z794 Long term (current) use of insulin: Secondary | ICD-10-CM | POA: Diagnosis not present

## 2021-10-21 DIAGNOSIS — L0390 Cellulitis, unspecified: Secondary | ICD-10-CM | POA: Diagnosis present

## 2021-10-21 DIAGNOSIS — E1152 Type 2 diabetes mellitus with diabetic peripheral angiopathy with gangrene: Secondary | ICD-10-CM | POA: Diagnosis present

## 2021-10-21 DIAGNOSIS — L089 Local infection of the skin and subcutaneous tissue, unspecified: Secondary | ICD-10-CM | POA: Diagnosis not present

## 2021-10-21 DIAGNOSIS — Z01812 Encounter for preprocedural laboratory examination: Secondary | ICD-10-CM | POA: Insufficient documentation

## 2021-10-21 DIAGNOSIS — Z20822 Contact with and (suspected) exposure to covid-19: Secondary | ICD-10-CM | POA: Diagnosis present

## 2021-10-21 DIAGNOSIS — B965 Pseudomonas (aeruginosa) (mallei) (pseudomallei) as the cause of diseases classified elsewhere: Secondary | ICD-10-CM | POA: Diagnosis not present

## 2021-10-21 DIAGNOSIS — M86672 Other chronic osteomyelitis, left ankle and foot: Secondary | ICD-10-CM | POA: Diagnosis present

## 2021-10-21 DIAGNOSIS — I10 Essential (primary) hypertension: Secondary | ICD-10-CM | POA: Diagnosis present

## 2021-10-21 DIAGNOSIS — E1165 Type 2 diabetes mellitus with hyperglycemia: Secondary | ICD-10-CM | POA: Diagnosis present

## 2021-10-21 DIAGNOSIS — Z7984 Long term (current) use of oral hypoglycemic drugs: Secondary | ICD-10-CM | POA: Diagnosis not present

## 2021-10-21 DIAGNOSIS — M009 Pyogenic arthritis, unspecified: Secondary | ICD-10-CM | POA: Diagnosis present

## 2021-10-21 DIAGNOSIS — Z820 Family history of epilepsy and other diseases of the nervous system: Secondary | ICD-10-CM

## 2021-10-21 DIAGNOSIS — M2012 Hallux valgus (acquired), left foot: Secondary | ICD-10-CM | POA: Diagnosis present

## 2021-10-21 DIAGNOSIS — M86171 Other acute osteomyelitis, right ankle and foot: Secondary | ICD-10-CM

## 2021-10-21 DIAGNOSIS — L97521 Non-pressure chronic ulcer of other part of left foot limited to breakdown of skin: Secondary | ICD-10-CM | POA: Diagnosis present

## 2021-10-21 DIAGNOSIS — E11628 Type 2 diabetes mellitus with other skin complications: Secondary | ICD-10-CM | POA: Diagnosis present

## 2021-10-21 DIAGNOSIS — T465X5A Adverse effect of other antihypertensive drugs, initial encounter: Secondary | ICD-10-CM | POA: Diagnosis not present

## 2021-10-21 DIAGNOSIS — E039 Hypothyroidism, unspecified: Secondary | ICD-10-CM | POA: Diagnosis present

## 2021-10-21 DIAGNOSIS — R001 Bradycardia, unspecified: Secondary | ICD-10-CM | POA: Diagnosis not present

## 2021-10-21 DIAGNOSIS — E11621 Type 2 diabetes mellitus with foot ulcer: Secondary | ICD-10-CM | POA: Diagnosis present

## 2021-10-21 DIAGNOSIS — L97511 Non-pressure chronic ulcer of other part of right foot limited to breakdown of skin: Secondary | ICD-10-CM | POA: Diagnosis present

## 2021-10-21 DIAGNOSIS — Z7982 Long term (current) use of aspirin: Secondary | ICD-10-CM | POA: Diagnosis not present

## 2021-10-21 DIAGNOSIS — E1169 Type 2 diabetes mellitus with other specified complication: Secondary | ICD-10-CM | POA: Diagnosis present

## 2021-10-21 DIAGNOSIS — M869 Osteomyelitis, unspecified: Secondary | ICD-10-CM

## 2021-10-21 DIAGNOSIS — E1142 Type 2 diabetes mellitus with diabetic polyneuropathy: Secondary | ICD-10-CM | POA: Diagnosis present

## 2021-10-21 DIAGNOSIS — I70263 Atherosclerosis of native arteries of extremities with gangrene, bilateral legs: Secondary | ICD-10-CM | POA: Diagnosis present

## 2021-10-21 LAB — CBC WITH DIFFERENTIAL/PLATELET
Abs Immature Granulocytes: 0.06 10*3/uL (ref 0.00–0.07)
Basophils Absolute: 0.1 10*3/uL (ref 0.0–0.1)
Basophils Relative: 0 %
Eosinophils Absolute: 0.4 10*3/uL (ref 0.0–0.5)
Eosinophils Relative: 3 %
HCT: 42.8 % (ref 39.0–52.0)
Hemoglobin: 13.9 g/dL (ref 13.0–17.0)
Immature Granulocytes: 0 %
Lymphocytes Relative: 13 %
Lymphs Abs: 1.8 10*3/uL (ref 0.7–4.0)
MCH: 28 pg (ref 26.0–34.0)
MCHC: 32.5 g/dL (ref 30.0–36.0)
MCV: 86.1 fL (ref 80.0–100.0)
Monocytes Absolute: 0.8 10*3/uL (ref 0.1–1.0)
Monocytes Relative: 6 %
Neutro Abs: 10.4 10*3/uL — ABNORMAL HIGH (ref 1.7–7.7)
Neutrophils Relative %: 78 %
Platelets: 311 10*3/uL (ref 150–400)
RBC: 4.97 MIL/uL (ref 4.22–5.81)
RDW: 13.6 % (ref 11.5–15.5)
WBC: 13.4 10*3/uL — ABNORMAL HIGH (ref 4.0–10.5)
nRBC: 0 % (ref 0.0–0.2)

## 2021-10-21 LAB — COMPREHENSIVE METABOLIC PANEL
ALT: 17 U/L (ref 0–44)
AST: 19 U/L (ref 15–41)
Albumin: 3.6 g/dL (ref 3.5–5.0)
Alkaline Phosphatase: 64 U/L (ref 38–126)
Anion gap: 12 (ref 5–15)
BUN: 19 mg/dL (ref 8–23)
CO2: 24 mmol/L (ref 22–32)
Calcium: 9.3 mg/dL (ref 8.9–10.3)
Chloride: 103 mmol/L (ref 98–111)
Creatinine, Ser: 0.93 mg/dL (ref 0.61–1.24)
GFR, Estimated: >60 mL/min (ref >60)
Glucose, Bld: 158 mg/dL — ABNORMAL HIGH (ref 70–99)
Potassium: 3.6 mmol/L (ref 3.5–5.1)
Sodium: 139 mmol/L (ref 135–145)
Total Bilirubin: 0.6 mg/dL (ref 0.3–1.2)
Total Protein: 8.6 g/dL — ABNORMAL HIGH (ref 6.5–8.1)

## 2021-10-21 LAB — GLUCOSE, CAPILLARY: Glucose-Capillary: 117 mg/dL — ABNORMAL HIGH (ref 70–99)

## 2021-10-21 LAB — LACTIC ACID, PLASMA
Lactic Acid, Venous: 1.5 mmol/L (ref 0.5–1.9)
Lactic Acid, Venous: 3.6 mmol/L (ref 0.5–1.9)

## 2021-10-21 LAB — RESP PANEL BY RT-PCR (FLU A&B, COVID) ARPGX2
Influenza A by PCR: NEGATIVE
Influenza B by PCR: NEGATIVE
SARS Coronavirus 2 by RT PCR: NEGATIVE

## 2021-10-21 MED ORDER — SODIUM CHLORIDE 0.9 % IV SOLN
2.0000 g | Freq: Once | INTRAVENOUS | Status: AC
  Administered 2021-10-21: 2 g via INTRAVENOUS
  Filled 2021-10-21: qty 12.5

## 2021-10-21 MED ORDER — SODIUM CHLORIDE 0.9 % IV SOLN
2.0000 g | Freq: Three times a day (TID) | INTRAVENOUS | Status: DC
  Administered 2021-10-22 – 2021-10-24 (×6): 2 g via INTRAVENOUS
  Filled 2021-10-21: qty 2
  Filled 2021-10-21: qty 12.5
  Filled 2021-10-21: qty 2
  Filled 2021-10-21: qty 12.5
  Filled 2021-10-21: qty 2
  Filled 2021-10-21: qty 12.5
  Filled 2021-10-21 (×2): qty 2
  Filled 2021-10-21: qty 12.5

## 2021-10-21 MED ORDER — ADULT MULTIVITAMIN W/MINERALS CH
1.0000 | ORAL_TABLET | Freq: Every day | ORAL | Status: DC
  Administered 2021-10-23 – 2021-10-24 (×2): 1 via ORAL
  Filled 2021-10-21 (×2): qty 1

## 2021-10-21 MED ORDER — KETOROLAC TROMETHAMINE 30 MG/ML IJ SOLN: 30.0000 mg | Freq: Four times a day (QID) | INTRAMUSCULAR | Status: DC | PRN

## 2021-10-21 MED ORDER — VANCOMYCIN HCL 1250 MG/250ML IV SOLN
1250.0000 mg | Freq: Two times a day (BID) | INTRAVENOUS | Status: DC
  Administered 2021-10-22 – 2021-10-24 (×4): 1250 mg via INTRAVENOUS
  Filled 2021-10-21 (×6): qty 250

## 2021-10-21 MED ORDER — BISACODYL 10 MG RE SUPP
10.0000 mg | Freq: Every day | RECTAL | Status: DC | PRN
  Filled 2021-10-21: qty 1

## 2021-10-21 MED ORDER — VANCOMYCIN HCL 1250 MG/250ML IV SOLN: 1250.0000 mg | Freq: Two times a day (BID) | INTRAVENOUS | Status: DC

## 2021-10-21 MED ORDER — ONDANSETRON HCL 4 MG/2ML IJ SOLN: 4.0000 mg | Freq: Four times a day (QID) | INTRAMUSCULAR | Status: DC | PRN

## 2021-10-21 MED ORDER — GADOBUTROL 1 MMOL/ML IV SOLN
8.0000 mL | Freq: Once | INTRAVENOUS | Status: AC | PRN
  Administered 2021-10-21: 8 mL via INTRAVENOUS

## 2021-10-21 MED ORDER — ONDANSETRON HCL 4 MG PO TABS: 4.0000 mg | ORAL_TABLET | Freq: Four times a day (QID) | ORAL | Status: DC | PRN

## 2021-10-21 MED ORDER — SODIUM CHLORIDE 0.9 % IV SOLN: 2.0000 g | Freq: Once | INTRAVENOUS | Status: DC

## 2021-10-21 MED ORDER — ACETAMINOPHEN 650 MG RE SUPP: 650.0000 mg | Freq: Four times a day (QID) | RECTAL | Status: DC | PRN

## 2021-10-21 MED ORDER — VANCOMYCIN HCL IN DEXTROSE 1-5 GM/200ML-% IV SOLN
1000.0000 mg | Freq: Once | INTRAVENOUS | Status: AC
  Administered 2021-10-21: 1000 mg via INTRAVENOUS
  Filled 2021-10-21 (×2): qty 200

## 2021-10-21 MED ORDER — VANCOMYCIN HCL IN DEXTROSE 1-5 GM/200ML-% IV SOLN
1000.0000 mg | Freq: Once | INTRAVENOUS | Status: AC
  Administered 2021-10-21: 1000 mg via INTRAVENOUS
  Filled 2021-10-21: qty 200

## 2021-10-21 MED ORDER — INSULIN ASPART 100 UNIT/ML IJ SOLN
0.0000 [IU] | INTRAMUSCULAR | Status: DC
  Administered 2021-10-22 (×2): 2 [IU] via SUBCUTANEOUS
  Administered 2021-10-23: 5 [IU] via SUBCUTANEOUS
  Administered 2021-10-23: 8 [IU] via SUBCUTANEOUS
  Administered 2021-10-23: 2 [IU] via SUBCUTANEOUS
  Administered 2021-10-23: 8 [IU] via SUBCUTANEOUS
  Administered 2021-10-23 – 2021-10-24 (×2): 3 [IU] via SUBCUTANEOUS
  Filled 2021-10-21 (×8): qty 1

## 2021-10-21 MED ORDER — VANCOMYCIN HCL IN DEXTROSE 1-5 GM/200ML-% IV SOLN: 1000.0000 mg | Freq: Once | INTRAVENOUS | Status: DC

## 2021-10-21 MED ORDER — SENNOSIDES-DOCUSATE SODIUM 8.6-50 MG PO TABS: 1.0000 | ORAL_TABLET | Freq: Every evening | ORAL | Status: DC | PRN

## 2021-10-21 MED ORDER — ENOXAPARIN SODIUM 40 MG/0.4ML IJ SOSY
40.0000 mg | PREFILLED_SYRINGE | Freq: Every day | INTRAMUSCULAR | Status: DC
  Administered 2021-10-21 – 2021-10-23 (×3): 40 mg via SUBCUTANEOUS
  Filled 2021-10-21 (×3): qty 0.4

## 2021-10-21 MED ORDER — ACETAMINOPHEN 325 MG PO TABS
650.0000 mg | ORAL_TABLET | Freq: Four times a day (QID) | ORAL | Status: DC | PRN
  Administered 2021-10-22 – 2021-10-24 (×4): 650 mg via ORAL
  Filled 2021-10-21 (×4): qty 2

## 2021-10-21 MED ORDER — SODIUM CHLORIDE 0.9 % IV SOLN: 2.0000 g | Freq: Three times a day (TID) | INTRAVENOUS | Status: DC

## 2021-10-21 NOTE — Progress Notes (Signed)
Pharmacy Antibiotic Note ? ?Alexander Gill is a 66 y.o. male admitted on 10/21/2021 with wound infection.  Pharmacy has been consulted for cefepime and vancomycin dosing. ? ?Plan: ?Cefepime 2 g IV Q8H ?Pt scheduled to receive vancomycin 2 g IV x 1 loading dose. Will continue with 1250 mg IV Q12H ?Est AUC: 506 ?Used: Scr 0.93, IBW, Vd 0.72 ?Obtain vanc levels around 4th or 5th dose if continued ?Monitor renal function and adjust dose as clinically indicated ? ?Height: 6' (182.9 cm) ?Weight: 88.5 kg (195 lb 1.7 oz) ?IBW/kg (Calculated) : 77.6 ? ?Temp (24hrs), Avg:98.2 ?F (36.8 ?C), Min:98.2 ?F (36.8 ?C), Max:98.2 ?F (36.8 ?C) ? ?Recent Labs  ?Lab 10/21/21 ?1216 10/21/21 ?1217  ?WBC  --  13.4*  ?CREATININE  --  0.93  ?LATICACIDVEN 1.5  --   ?  ?Estimated Creatinine Clearance: 88.1 mL/min (by C-G formula based on SCr of 0.93 mg/dL).   ? ?No Known Allergies ? ?Antimicrobials this admission: ?4/4 cefepime >>  ?4/4 vancomycin >>  ? ? ?Microbiology results: ? BCx:  ? UCx:   ? Sputum:   ? MRSA PCR:  ? ?Thank you for allowing pharmacy to be a part of this patient?s care. ? ?Robyne Peers Stephanie Mcglone ?10/21/2021 5:45 PM ? ?

## 2021-10-21 NOTE — Progress Notes (Signed)
PHARMACY -  BRIEF ANTIBIOTIC NOTE  ? ?Pharmacy has received consult(s) for cefepime and vancomycin from an ED provider.  The patient's profile has been reviewed for ht/wt/allergies/indication/available labs.   ? ?One time order(s) placed for: ?Cefepime 2 g IV ?Vancomycin 2 g IV ? ?Further antibiotics/pharmacy consults should be ordered by admitting physician if indicated.       ?                ?Thank you, ?Kelechi Astarita O Makar Slatter ?10/21/2021  4:55 PM ? ?

## 2021-10-21 NOTE — ED Notes (Signed)
Informed RN bed assigned 

## 2021-10-21 NOTE — ED Triage Notes (Addendum)
Pt comes with c/o right 3rd toe infection. Pt sent over by PCP for antibiotics. Cultures sent to lab per [facility]. ? ?Pt is diabetic. ?

## 2021-10-21 NOTE — ED Provider Notes (Signed)
? ? ?[facility] ?Emergency Department Provider Note ? ? ? ? Event Date/Time  ? First MD Initiated Contact with Patient 10/21/21 1615   ?  (approximate) ? ? ?History  ? ?Nail Problem ? ? ?HPI ? ?Alexander Gill is a 66 y.o. male with a history of DM, HTN, HLD, hypothyroidism and osteomyelitis of the right foot presents for worsening infection of the right 3rd toe. He is being sent over by [facility] for concern for sepsis, osteomyelitis, and IV antibiotics.  ? ? ?Physical Exam  ? ?Triage Vital Signs: ?ED Triage Vitals  ?Enc Vitals Group  ?   BP 10/21/21 1218 133/80  ?   Pulse Rate 10/21/21 1218 (!) 58  ?   Resp 10/21/21 1218 17  ?   Temp 10/21/21 1218 98.2 ?F (36.8 ?C)  ?   Temp Source 10/21/21 1218 Oral  ?   SpO2 10/21/21 1218 98 %  ?   Weight --   ?   Height --   ?   Head Circumference --   ?   Peak Flow --   ?   Pain Score 10/21/21 1215 6  ?   Pain Loc --   ?   Pain Edu? --   ?   Excl. in GC? --   ? ? ?Most recent vital signs: ?Vitals:  ? 10/21/21 1218 10/21/21 1556  ?BP: 133/80 132/80  ?Pulse: (!) 58 (!) 58  ?Resp: 17 18  ?Temp: 98.2 ?F (36.8 ?C)   ?SpO2: 98% 98%  ? ? ?General Awake, no distress.  ?CV:  Good peripheral perfusion.  ?RESP:  Normal effort.  ?ABD:  No distention.  ?MSK:  Right foot with dressing in place from podiatry office ? ? ?ED Results / Procedures / Treatments  ? ?Labs ?(all labs ordered are listed, but only abnormal results are displayed) ?Labs Reviewed  ?COMPREHENSIVE METABOLIC PANEL - Abnormal; Notable for the following components:  ?    Result Value  ? Glucose, Bld 158 (*)   ? Total Protein 8.6 (*)   ? All other components within normal limits  ?CBC WITH DIFFERENTIAL/PLATELET - Abnormal; Notable for the following components:  ? WBC 13.4 (*)   ? Neutro Abs 10.4 (*)   ? All other components within normal limits  ?CULTURE, BLOOD (ROUTINE X 2)  ?CULTURE, BLOOD (ROUTINE X 2)  ?LACTIC ACID, PLASMA  ?LACTIC ACID, PLASMA  ?URINALYSIS, ROUTINE W REFLEX MICROSCOPIC   ? ? ? ?EKG ? ? ?RADIOLOGY ? ?I personally viewed and evaluated these images as part of my medical decision making, as well as reviewing the written report by the radiologist. ? ?ED Provider Interpretation: bony changes noted with soft tis} ? ?DG Foot Complete Left ? ?Result Date: 10/21/2021 ?CLINICAL DATA:  injury EXAM: LEFT FOOT - COMPLETE 3+ VIEW COMPARISON:  Radiograph 01/15/2021 FINDINGS: There is periarticular bone destruction at the great toe interphalangeal joint, with increased in extent though with also increased sclerosis in comparison to prior exam. There is new periarticular bony destruction at the proximal phalangeal joint of the second toe. There is new periarticular bony destruction at the distal interphalangeal joint of the third toe. IMPRESSION: New periarticular bone destruction at the PIP joint of the second digit and DIP joint of the third digit concerning for osteomyelitis/septic arthritis. Sequelae of great toe IP joint septic arthritis with periarticular lucency and sclerosis consistent with chronic osteomyelitis. Electronically Signed   By: Caprice Renshaw M.D.   On: 10/21/2021 13:11  ? ?  DG Foot Complete Right ? ?Result Date: 10/21/2021 ?CLINICAL DATA:  injury EXAM: RIGHT FOOT COMPLETE - 3+ VIEW COMPARISON:  Right foot radiograph 07/04/2021 FINDINGS: Prior fourth toe amputation. There is bony destruction of the third toe middle and distal phalanx. There is dislocation at the PIP joint of the third toe. There is significant soft tissue swelling of the third toe. Mild lucency of the fourth metatarsal head. Unchanged partial bony fusion at the great toe IP joint. Mild first MTP osteoarthritis. There is also generalized soft tissue swelling of the right foot. IMPRESSION: Soft tissue swelling of the third toe with bony destruction of the middle and distal phalanges, concerning for osteomyelitis/pathologic fracture. Prior fourth toe amputation. Mild lucency of the fourth metatarsal head, nonspecific  without obvious bony erosion radiographically, osteomyelitis cannot be completely excluded. Electronically Signed   By: Caprice RenshawJacob  Kahn M.D.   On: 10/21/2021 13:07   ? ? ?PROCEDURES: ? ?Critical Care performed: Yes, see critical care procedure note(s) ? ?Procedures ? ?CRITICAL CARE ?Performed by: Lissa HoardJenise V Bacon-Shahana Capes ? ? ?Total critical care time: 30 minutes ? ?Critical care time was exclusive of separately billable procedures and treating other patients. ? ?Critical care was necessary to treat or prevent imminent or life-threatening deterioration. ? ?Critical care was time spent personally by me on the following activities: development of treatment plan with patient and/or surrogate as well as nursing, discussions with consultants, evaluation of patient's response to treatment, examination of patient, obtaining history from patient or surrogate, ordering and performing treatments and interventions, ordering and review of laboratory studies, ordering and review of radiographic studies, pulse oximetry and re-evaluation of patient's condition. ? ? ?MEDICATIONS ORDERED IN ED: ?Medications  ?ceFEPIme (MAXIPIME) 2 g in sodium chloride 0.9 % 100 mL IVPB (has no administration in time range)  ?vancomycin (VANCOCIN) IVPB 1000 mg/200 mL premix (has no administration in time range)  ?  Followed by  ?vancomycin (VANCOCIN) IVPB 1000 mg/200 mL premix (has no administration in time range)  ? ? ? ?IMPRESSION / MDM / ASSESSMENT AND PLAN / ED COURSE  ?I reviewed the triage vital signs and the nursing notes. ?             ?               ? ?Differential diagnosis includes, but is not limited to, sepsis, osteomyelitis, abscess, septic arthritis, cellulitis ? ?The patient is on the cardiac monitor to evaluate for evidence of arrhythmia and/or significant heart rate changes. ? ?Patient to the ED for evaluation of progressive worsening infection to the right foot after recent third toe amputation.  Patient presents from the the podiatry  office for IV antibiotics.  Patient in no acute distress reporting no recent fevers but some previous episodes of chills.  Patient is aware of the plan for admission and IV antibiotics.  He is agreeable to the plan.  Patient's diagnosis is consistent with right foot osteomyelitis and cellulitis. Patient will be mated to the hospital service for further evaluation and podiatry will be consulted for surgical intervention. ? ?----------------------------------------- ?5:26 PM on 10/21/2021 ?----------------------------------------- ?Dr. Evalyn CascoS. Amery will see the patient and admit to the hospitalist service.  ?  ? ? ?FINAL CLINICAL IMPRESSION(S) / ED DIAGNOSES  ? ?Final diagnoses:  ?Other acute osteomyelitis of right foot (HCC)  ?Diabetic foot infection (HCC)  ? ? ? ?Rx / DC Orders  ? ?ED Discharge Orders   ? ? None  ? ?  ? ? ? ?Note:  This  document was prepared using Conservation officer, historic buildings and may include unintentional dictation errors. ? ?  ?Lissa Hoard, PA-C ?10/21/21 1729 ? ?  ?Concha Se, MD ?10/22/21 0004 ? ?

## 2021-10-21 NOTE — H&P (Signed)
?History and Physical  ? ? ?Alexander Gill [id number] DOB: Jun 03, 1956 DOA: 10/21/2021 ? ?PCP: Azucena Cecil, MD  ?Patient coming from: Podiatry office ? ? ?Chief Complaint: Right foot pain/drainage, swelling ? ?HPI: Alexander Gill is a 66 y.o. male  with medical history significant of hypertension, hyperlipidemia, diabetes mellitus, hypothyroidism was sent from his podiatrist office for right third foot infection.  He had presented to his office with pain, worsening drainage swelling and redness.  Had chills 4 days ago.  Denies any fever, calf pain, chest pain, shortness of breath or any other symptoms.  Cultures were taken at Dr. Paulene Floor office.  Patient was sent to ER for admission and IV antibiotics. ? ?Off note patient was discharged on December 2022 after being admitted with right foot fourth toe infection.  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  Cultures at that time grew Staphylococcus.  He was discharged with Augmentin/doxycycline with podiatry follow-up.  Per patient he was doing well until this episode happened. ? ? ?ED Course: Blood pressure 133/80, heart rate 58, respiratory rate 17, afebrile, satting 98% on room air. ? ?X-ray of the left foot: Personally reviewed ?New periarticular bone destruction at the PIP joint of the second ?digit and DIP joint of the third digit concerning for ?osteomyelitis/septic arthritis. ?Sequelae of great toe IP joint septic arthritis with periarticular ?lucency and sclerosis consistent with chronic osteomyelitis. ? ? ?X-ray of right foot: Personally reviewed ?Soft tissue swelling of the third toe with bony destruction of the ?middle and distal phalanges, concerning for osteomyelitis/pathologic ?fracture. ?Prior fourth toe amputation. Mild lucency of the fourth metatarsal ?head, nonspecific without obvious bony erosion radiographically, ?osteomyelitis cannot be completely excluded. ? ? ED course: Blood cultures taken, wound  cultures taken and office.  SARS COVID test ordered pending.  MRI of the foot ordered pending.  Glucose 158, total protein 8.6 BMP otherwise unremarkable.  lactic acid 1.5, WBC 13.4, hemoglobin 13.9, hematocrit 42.8, platelets 211.   EKG ordered, pending ? ?Review of Systems: All systems reviewed and otherwise negative.  ? ? ?Past Medical History:  ?Diagnosis Date  ? Cataract   ? bilateral eyes  ? Diabetes mellitus without complication ([place])   ? 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 Hypertension   ? ? ?Past Surgical History:  ?Procedure Laterality Date  ? AMPUTATION TOE Right 07/05/2021  ? Procedure: AMPUTATION RIGHT 4TH TOE;  Surgeon: Sharlotte Alamo, DPM;  Location: [facility] ORS;  Service: Podiatry;  Laterality: Right;  ? HERNIA REPAIR    ? ? ? reports that he has never smoked. He has never used smokeless tobacco. He reports that he does not currently use alcohol. He reports that he does not currently use drugs. ? ?No Known Allergies ? ?Family History  ?Problem Relation Age of Onset  ? Diabetes Mother   ? Diabetes Father   ? Parkinson's disease Brother   ? ? ? ?Prior to Admission medications   ?Medication Sig Start Date End Date Taking? Authorizing Provider  ?aspirin EC 81 MG tablet Take 81 mg by mouth daily. Swallow whole.    [provider]  ?atenolol (TENORMIN) 25 MG tablet Take 25 mg by mouth daily. 12/11/20   [provider]  ?cholecalciferol (VITAMIN D3) 25 MCG (1000 UNIT) tablet Take 1,000 Units by mouth daily.    [provider]  ?Dapagliflozin-metFORMIN HCl ER (XIGDUO XR) 11-998 MG TB24 Take 1 tablet by mouth daily at 2 am.  [provider]  ?LANTUS 100 UNIT/ML injection Inject 30 Units into the skin daily. 06/16/21   [provider]  ?levothyroxine (SYNTHROID) 25 MCG tablet Take 25 mcg by mouth daily. 09/27/20   [provider]  ?losartan-hydrochlorothiazide (HYZAAR) 100-25 MG tablet Take 1 tablet by mouth every morning. ?Patient not taking:  Reported on 07/04/2021 06/30/21   [provider]  ?repaglinide (PRANDIN) 1 MG tablet Take 1 mg by mouth daily. Take 30 minutes prior to dinner. 01/08/21   [provider]  ?rosuvastatin (CRESTOR) 20 MG tablet Take 20 mg by mouth daily. 10/16/20   [provider]  ?metFORMIN (GLUCOPHAGE) 1000 MG tablet Take 1,000 mg by mouth 2 (two) times daily. 12/10/20 01/17/21  [provider]  ? ? ?Physical Exam: ?Vitals:  ? 10/21/21 1218 10/21/21 1556 10/21/21 1618  ?BP: 133/80 132/80   ?Pulse: (!) 58 (!) 58   ?Resp: 17 18   ?Temp: 98.2 ?F (36.8 ?C)    ?TempSrc: Oral    ?SpO2: 98% 98%   ?Weight:   88.5 kg  ?Height:   6' (1.829 m)  ? ? ?Constitutional: NAD, calm, comfortable ?Vitals:  ? 10/21/21 1218 10/21/21 1556 10/21/21 1618  ?BP: 133/80 132/80   ?Pulse: (!) 58 (!) 58   ?Resp: 17 18   ?Temp: 98.2 ?F (36.8 ?C)    ?TempSrc: Oral    ?SpO2: 98% 98%   ?Weight:   88.5 kg  ?Height:   6' (1.829 m)  ? ?Eyes: PERRL, lids and conjunctivae normal ?ENMT: Mucous membranes are moist. Posterior pharynx clear of any exudate or lesions.Normal dentition.  ?Neck: normal, supple, no masses, no thyromegaly ?Respiratory: clear to auscultation bilaterally, no wheezing, no crackles. Normal respiratory effort. No accessory muscle use.  ?Cardiovascular: Regular rate and rhythm, no murmurs / rubs / gallops. No extremity edema. 2+ pedal pulses. No carotid bruits.  ?Abdomen: no tenderness, no masses palpated. No hepatosplenomegaly. Bowel sounds positive.  ?Musculoskeletal: no clubbing / cyanosis.  Good ROM, no contractures. Normal muscle tone. Rt LE swelling, erythema up to lower shin, warm to touch. Rt 3rd toes, no drainage, open wound. Lt big toes, 4th toes, wound, no drainage. ?Skin: warm dry ?Neurologic: CN 2-12 grossly intact. Sensation intact,  Strength 5/5 in all 4.  ?Psychiatric: Normal judgment and insight. Alert and oriented x 3. Normal mood.  ? ? ?Labs on Admission: I have personally reviewed following labs and  imaging studies ? ?CBC: ?Recent Labs  ?Lab 10/21/21 ?1217  ?WBC 13.4*  ?NEUTROABS 10.4*  ?HGB 13.9  ?HCT 42.8  ?MCV 86.1  ?PLT 311  ? ?Basic Metabolic Panel: ?Recent Labs  ?Lab 10/21/21 ?1217  ?NA 139  ?K 3.6  ?CL 103  ?CO2 24  ?GLUCOSE 158*  ?BUN 19  ?CREATININE 0.93  ?CALCIUM 9.3  ? ?GFR: ?Estimated Creatinine Clearance: 88.1 mL/min (by C-G formula based on SCr of 0.93 mg/dL). ?Liver Function Tests: ?Recent Labs  ?Lab 10/21/21 ?1217  ?AST 19  ?ALT 17  ?ALKPHOS 64  ?BILITOT 0.6  ?PROT 8.6*  ?ALBUMIN 3.6  ? ?No results for input(s): LIPASE, AMYLASE in the last 168 hours. ?No results for input(s): AMMONIA in the last 168 hours. ?Coagulation Profile: ?No results for input(s): INR, PROTIME in the last 168 hours. ?Cardiac Enzymes: ?No results for input(s): CKTOTAL, CKMB, CKMBINDEX, TROPONINI in the last 168 hours. ?BNP (last 3 results) ?No results for input(s): PROBNP in the last 8760 hours. ?HbA1C: ?No results for input(s): HGBA1C in the last 72 hours. ?CBG: ?  No results for input(s): GLUCAP in the last 168 hours. ?Lipid Profile: ?No results for input(s): CHOL, HDL, LDLCALC, TRIG, CHOLHDL, LDLDIRECT in the last 72 hours. ?Thyroid Function Tests: ?No results for input(s): TSH, T4TOTAL, FREET4, T3FREE, THYROIDAB in the last 72 hours. ?Anemia Panel: ?No results for input(s): VITAMINB12, FOLATE, FERRITIN, TIBC, IRON, RETICCTPCT in the last 72 hours. ?Urine analysis: ?No results found for: COLORURINE, APPEARANCEUR, [place], [place], [place], [place], [place], KETONESUR, PROTEINUR, [place], NITRITE, LEUKOCYTESUR ? ?Radiological Exams on Admission: ?DG Foot Complete Left ? ?Result Date: 10/21/2021 ?CLINICAL DATA:  injury EXAM: LEFT FOOT - COMPLETE 3+ VIEW COMPARISON:  Radiograph 01/15/2021 FINDINGS: There is periarticular bone destruction at the great toe interphalangeal joint, with increased in extent though with also increased sclerosis in comparison to prior exam. There is new periarticular bony destruction at the  proximal phalangeal joint of the second toe. There is new periarticular bony destruction at the distal interphalangeal joint of the third toe. IMPRESSION: New periarticular bone destruction at the PIP joint of the se

## 2021-10-21 NOTE — Progress Notes (Signed)
? ?      CROSS COVER NOTE ? ?NAME: Alexander Gill ?MRN: [id number] ?DOB : 02-28-1956 ? ? ? ?Date of Service ?  10/21/2021  ?HPI/Events of Note ?  Lactic 3.6 ? ?Patient present to ED for foot pain and swelling and is being treated for osteomyelitis and cellulitis.  ?Interventions ?  Plan: ?Continue vancomycin and cefepime  ?MRI foot pending ?Follow up lactic ordered ?   ?  ?Bishop Limbo MHA, MSN, FNP-BC ?Nurse Practitioner ?[facility] ?[place] ?Pager [phone] ? ?

## 2021-10-21 NOTE — ED Notes (Signed)
See triage note  presents with possible infection to right 3 rd toe  states he is a diabetic and has an ulcer      ?

## 2021-10-22 ENCOUNTER — Encounter: Payer: Self-pay | Admitting: Internal Medicine

## 2021-10-22 ENCOUNTER — Inpatient Hospital Stay: Payer: BC Managed Care – PPO | Admitting: Anesthesiology

## 2021-10-22 ENCOUNTER — Inpatient Hospital Stay: Payer: BC Managed Care – PPO

## 2021-10-22 ENCOUNTER — Encounter
Admission: EM | Disposition: A | Discharge status: Home / Self Care | Payer: Self-pay | Source: Home / Self Care | Attending: Internal Medicine

## 2021-10-22 DIAGNOSIS — L089 Local infection of the skin and subcutaneous tissue, unspecified: Secondary | ICD-10-CM

## 2021-10-22 DIAGNOSIS — M86171 Other acute osteomyelitis, right ankle and foot: Secondary | ICD-10-CM

## 2021-10-22 DIAGNOSIS — E11628 Type 2 diabetes mellitus with other skin complications: Secondary | ICD-10-CM

## 2021-10-22 DIAGNOSIS — M869 Osteomyelitis, unspecified: Secondary | ICD-10-CM

## 2021-10-22 DIAGNOSIS — L03115 Cellulitis of right lower limb: Secondary | ICD-10-CM

## 2021-10-22 HISTORY — PX: TOE ARTHROPLASTY: SHX6504

## 2021-10-22 HISTORY — PX: AMPUTATION: SHX166

## 2021-10-22 HISTORY — PX: INCISION AND DRAINAGE OF WOUND: SHX1803

## 2021-10-22 LAB — CREATININE, SERUM
Creatinine, Ser: 0.78 mg/dL (ref 0.61–1.24)
GFR, Estimated: >60 mL/min (ref >60)

## 2021-10-22 LAB — GLUCOSE, CAPILLARY
Glucose-Capillary: 103 mg/dL — ABNORMAL HIGH (ref 70–99)
Glucose-Capillary: 116 mg/dL — ABNORMAL HIGH (ref 70–99)
Glucose-Capillary: 119 mg/dL — ABNORMAL HIGH (ref 70–99)
Glucose-Capillary: 127 mg/dL — ABNORMAL HIGH (ref 70–99)
Glucose-Capillary: 131 mg/dL — ABNORMAL HIGH (ref 70–99)
Glucose-Capillary: 132 mg/dL — ABNORMAL HIGH (ref 70–99)
Glucose-Capillary: 135 mg/dL — ABNORMAL HIGH (ref 70–99)
Glucose-Capillary: 172 mg/dL — ABNORMAL HIGH (ref 70–99)
Glucose-Capillary: 184 mg/dL — ABNORMAL HIGH (ref 70–99)

## 2021-10-22 LAB — MRSA NEXT GEN BY PCR, NASAL: MRSA by PCR Next Gen: DETECTED — AB

## 2021-10-22 LAB — LACTIC ACID, PLASMA: Lactic Acid, Venous: 0.7 mmol/L (ref 0.5–1.9)

## 2021-10-22 SURGERY — AMPUTATION, FOOT, RAY
Anesthesia: General | Site: Toe | Laterality: Right

## 2021-10-22 MED ORDER — PROPOFOL 10 MG/ML IV BOLUS
INTRAVENOUS | Status: DC | PRN
  Administered 2021-10-22: 60 mg via INTRAVENOUS

## 2021-10-22 MED ORDER — CHLORHEXIDINE GLUCONATE CLOTH 2 % EX PADS
6.0000 | MEDICATED_PAD | Freq: Every day | CUTANEOUS | Status: DC
  Administered 2021-10-23: 6 via TOPICAL

## 2021-10-22 MED ORDER — LIDOCAINE HCL (CARDIAC) PF 50 MG/5ML IV SOSY
PREFILLED_SYRINGE | INTRAVENOUS | Status: DC | PRN
  Administered 2021-10-22: 30 mg via INTRAVENOUS

## 2021-10-22 MED ORDER — MIDAZOLAM HCL 2 MG/2ML IJ SOLN
INTRAMUSCULAR | Status: DC | PRN
  Administered 2021-10-22: 2 mg via INTRAVENOUS

## 2021-10-22 MED ORDER — LACTATED RINGERS IV SOLN: INTRAVENOUS | Status: DC | PRN

## 2021-10-22 MED ORDER — PROPOFOL 500 MG/50ML IV EMUL
INTRAVENOUS | Status: DC | PRN
  Administered 2021-10-22: 70 ug/kg/min via INTRAVENOUS

## 2021-10-22 MED ORDER — CHLORHEXIDINE GLUCONATE 4 % EX LIQD
60.0000 mL | Freq: Once | CUTANEOUS | Status: AC
  Administered 2021-10-22: 4 via TOPICAL

## 2021-10-22 MED ORDER — ATENOLOL 25 MG PO TABS
25.0000 mg | ORAL_TABLET | Freq: Every day | ORAL | Status: DC
  Administered 2021-10-22 – 2021-10-24 (×3): 25 mg via ORAL
  Filled 2021-10-22 (×3): qty 1

## 2021-10-22 MED ORDER — LIDOCAINE HCL (PF) 1 % IJ SOLN
INTRAMUSCULAR | Status: AC
  Filled 2021-10-22: qty 30

## 2021-10-22 MED ORDER — PROPOFOL 1000 MG/100ML IV EMUL
INTRAVENOUS | Status: AC
  Filled 2021-10-22: qty 100

## 2021-10-22 MED ORDER — MIDAZOLAM HCL 2 MG/2ML IJ SOLN
INTRAMUSCULAR | Status: AC
  Filled 2021-10-22: qty 2

## 2021-10-22 MED ORDER — FENTANYL CITRATE (PF) 100 MCG/2ML IJ SOLN: 25.0000 ug | INTRAMUSCULAR | Status: DC | PRN

## 2021-10-22 MED ORDER — VANCOMYCIN HCL 1000 MG IV SOLR
INTRAVENOUS | Status: AC
  Filled 2021-10-22: qty 20

## 2021-10-22 MED ORDER — KETAMINE HCL 50 MG/5ML IJ SOSY
PREFILLED_SYRINGE | INTRAMUSCULAR | Status: AC
  Filled 2021-10-22: qty 5

## 2021-10-22 MED ORDER — 0.9 % SODIUM CHLORIDE (POUR BTL) OPTIME
TOPICAL | Status: DC | PRN
  Administered 2021-10-22: 1000 mL

## 2021-10-22 MED ORDER — FENTANYL CITRATE (PF) 100 MCG/2ML IJ SOLN
INTRAMUSCULAR | Status: AC
  Filled 2021-10-22: qty 2

## 2021-10-22 MED ORDER — VITAMIN D 25 MCG (1000 UNIT) PO TABS
1000.0000 [IU] | ORAL_TABLET | Freq: Every day | ORAL | Status: DC
  Administered 2021-10-23 – 2021-10-24 (×2): 1000 [IU] via ORAL
  Filled 2021-10-22 (×2): qty 1

## 2021-10-22 MED ORDER — FENTANYL CITRATE (PF) 100 MCG/2ML IJ SOLN
INTRAMUSCULAR | Status: DC | PRN
  Administered 2021-10-22 (×4): 25 ug via INTRAVENOUS

## 2021-10-22 MED ORDER — MUPIROCIN 2 % EX OINT
1.0000 | TOPICAL_OINTMENT | Freq: Two times a day (BID) | CUTANEOUS | Status: DC
Start: 2021-10-22 — End: 2021-10-24
  Administered 2021-10-22 – 2021-10-23 (×3): 1 via NASAL
  Filled 2021-10-22 (×2): qty 22

## 2021-10-22 MED ORDER — LEVOTHYROXINE SODIUM 25 MCG PO TABS
25.0000 ug | ORAL_TABLET | Freq: Every day | ORAL | Status: DC
  Administered 2021-10-23 – 2021-10-24 (×2): 25 ug via ORAL
  Filled 2021-10-22 (×2): qty 1

## 2021-10-22 MED ORDER — LIDOCAINE-EPINEPHRINE 2 %-1:100000 IJ SOLN
INTRAMUSCULAR | Status: AC
  Filled 2021-10-22: qty 1

## 2021-10-22 MED ORDER — VANCOMYCIN HCL 1000 MG IV SOLR
INTRAVENOUS | Status: DC | PRN
  Administered 2021-10-22: 1000 mg

## 2021-10-22 MED ORDER — KETAMINE HCL 10 MG/ML IJ SOLN
INTRAMUSCULAR | Status: DC | PRN
  Administered 2021-10-22 (×5): 10 mg via INTRAVENOUS

## 2021-10-22 MED ORDER — ROSUVASTATIN CALCIUM 10 MG PO TABS
20.0000 mg | ORAL_TABLET | Freq: Every day | ORAL | Status: DC
  Administered 2021-10-23 – 2021-10-24 (×2): 20 mg via ORAL
  Filled 2021-10-22 (×2): qty 2

## 2021-10-22 MED ORDER — LIDOCAINE HCL 1 % IJ SOLN
INTRAMUSCULAR | Status: DC | PRN
  Administered 2021-10-22 (×2): 10 mL
  Administered 2021-10-22: 3 mL
  Administered 2021-10-22: 7 mL via INTRADERMAL

## 2021-10-22 SURGICAL SUPPLY — 46 items
BLADE MED AGGRESSIVE (BLADE) ×4 IMPLANT
BLADE OSC/SAGITTAL MD 5.5X18 (BLADE) IMPLANT
BLADE SURG 15 STRL LF DISP TIS (BLADE) IMPLANT
BLADE SURG 15 STRL SS (BLADE)
BLADE SURG MINI STRL (BLADE) IMPLANT
BNDG CONFORM 2 STRL LF (GAUZE/BANDAGES/DRESSINGS) ×2 IMPLANT
BNDG ELASTIC 4X5.8 VLCR STR LF (GAUZE/BANDAGES/DRESSINGS) ×5 IMPLANT
BNDG ESMARK 4X12 TAN STRL LF (GAUZE/BANDAGES/DRESSINGS) ×4 IMPLANT
BNDG GAUZE ELAST 4 BULKY (GAUZE/BANDAGES/DRESSINGS) ×4 IMPLANT
CNTNR SPEC 2.5X3XGRAD LEK (MISCELLANEOUS) ×3
CONT SPEC 4OZ STER OR WHT (MISCELLANEOUS) ×4
CONTAINER SPEC 2.5X3XGRAD LEK (MISCELLANEOUS) ×3 IMPLANT
CUFF TOURN SGL QUICK 12 (TOURNIQUET CUFF) IMPLANT
CUFF TOURN SGL QUICK 18X4 (TOURNIQUET CUFF) IMPLANT
DRAPE FLUOR MINI C-ARM 54X84 (DRAPES) IMPLANT
DURAPREP 26ML APPLICATOR (WOUND CARE) ×5 IMPLANT
ELECT REM PT RETURN 9FT ADLT (ELECTROSURGICAL) ×4
ELECTRODE REM PT RTRN 9FT ADLT (ELECTROSURGICAL) ×3 IMPLANT
GAUZE SPONGE 4X4 12PLY STRL (GAUZE/BANDAGES/DRESSINGS) ×8 IMPLANT
GAUZE XEROFORM 1X8 LF (GAUZE/BANDAGES/DRESSINGS) ×5 IMPLANT
GLOVE SURG ENC MOIS LTX SZ7 (GLOVE) ×4 IMPLANT
GLOVE SURG UNDER LTX SZ7 (GLOVE) ×4 IMPLANT
GOWN STRL REUS W/ TWL LRG LVL3 (GOWN DISPOSABLE) ×6 IMPLANT
GOWN STRL REUS W/TWL LRG LVL3 (GOWN DISPOSABLE) ×8
HANDPIECE VERSAJET DEBRIDEMENT (MISCELLANEOUS) IMPLANT
IV NS IRRIG 3000ML ARTHROMATIC (IV SOLUTION) IMPLANT
KIT TURNOVER KIT A (KITS) ×4 IMPLANT
LABEL OR SOLS (LABEL) ×4 IMPLANT
MANIFOLD NEPTUNE II (INSTRUMENTS) ×4 IMPLANT
NDL FILTER BLUNT 18X1 1/2 (NEEDLE) ×3 IMPLANT
NDL HYPO 25X1 1.5 SAFETY (NEEDLE) ×6 IMPLANT
NEEDLE FILTER BLUNT 18X 1/2SAF (NEEDLE) ×1
NEEDLE FILTER BLUNT 18X1 1/2 (NEEDLE) ×3 IMPLANT
NEEDLE HYPO 25X1 1.5 SAFETY (NEEDLE) ×8 IMPLANT
NS IRRIG 500ML POUR BTL (IV SOLUTION) ×4 IMPLANT
PACK EXTREMITY ARMC (MISCELLANEOUS) ×4 IMPLANT
SOL PREP PVP 2OZ (MISCELLANEOUS)
SOLUTION PREP PVP 2OZ (MISCELLANEOUS) ×3 IMPLANT
STOCKINETTE STRL 6IN 960660 (GAUZE/BANDAGES/DRESSINGS) ×4 IMPLANT
SUT ETHILON 3-0 FS-10 30 BLK (SUTURE) ×12
SUT VIC AB 3-0 SH 27 (SUTURE) ×4
SUT VIC AB 3-0 SH 27X BRD (SUTURE) ×3 IMPLANT
SUTURE EHLN 3-0 FS-10 30 BLK (SUTURE) ×6 IMPLANT
SWAB CULTURE AMIES ANAERIB BLU (MISCELLANEOUS) ×1 IMPLANT
SYR 10ML LL (SYRINGE) ×4 IMPLANT
WATER STERILE IRR 500ML POUR (IV SOLUTION) ×4 IMPLANT

## 2021-10-22 NOTE — H&P (Signed)
HISTORY AND PHYSICAL INTERVAL NOTE: ? ?10/22/2021 ? ?5:18 PM ? ?Alexander Gill  has presented today for surgery, with the diagnosis of right 3rd toe osteomyelitis/cellulitis, left 2nd toe osteomyelitis with wound, left hallux wound The various methods of treatment have been discussed with the patient.  No guarantees were given.  After consideration of risks, benefits and other options for treatment, the patient has consented to surgery.  I have reviewed the patients? chart and labs.   ? ?PROCEDURE: ?RIGHT 3RD PARTIAL RAY AMPUTATION ?LEFT HALLUX WOUND DEBRIDEMENT ?LEFT 2ND TOE PIPJ ARTHROPLASTY WITH BONE BIOPSY AND DEBRIDMEENT ? ?A history and physical examination was performed in the hospital.  The patient was reexamined.  There have been no changes to this history and physical examination. ? ?Rosetta Posner, DPM ? ?

## 2021-10-22 NOTE — Plan of Care (Signed)
?  Problem: Clinical Measurements: ?Goal: Ability to maintain clinical measurements within normal limits will improve ?Outcome: Progressing ?Goal: Will remain free from infection ?Outcome: Progressing ?Goal: Diagnostic test results will improve ?Outcome: Progressing ?  ?Problem: Pain Managment: ?Goal: General experience of comfort will improve ?Outcome: Progressing ?  ?Problem: Safety: ?Goal: Ability to remain free from injury will improve ?Outcome: Progressing ?  ?Problem: Skin Integrity: ?Goal: Risk for impaired skin integrity will decrease ?Outcome: Progressing ?  ?Problem: Skin Integrity: ?Goal: Skin integrity will improve ?Outcome: Progressing ?  ?

## 2021-10-22 NOTE — Plan of Care (Signed)
?  Problem: Clinical Measurements: ?Goal: Ability to maintain clinical measurements within normal limits will improve ?Outcome: Progressing ?Goal: Will remain free from infection ?Outcome: Progressing ?Goal: Diagnostic test results will improve ?Outcome: Progressing ?  ?Problem: Pain Managment: ?Goal: General experience of comfort will improve ?Outcome: Progressing ?  ?Problem: Safety: ?Goal: Ability to remain free from injury will improve ?Outcome: Progressing ?  ?Problem: Skin Integrity: ?Goal: Risk for impaired skin integrity will decrease ?Outcome: Progressing ?  ?Problem: Clinical Measurements: ?Goal: Ability to avoid or minimize complications of infection will improve ?Outcome: Progressing ?  ?Problem: Skin Integrity: ?Goal: Skin integrity will improve ?Outcome: Progressing ?  ?Problem: Education: ?Goal: Knowledge of General Education information will improve ?Description: Including pain rating scale, medication(s)/side effects and non-pharmacologic comfort measures ?Outcome: Completed/Met ?  ?Problem: Health Behavior/Discharge Planning: ?Goal: Ability to manage health-related needs will improve ?Outcome: Completed/Met ?  ?Problem: Activity: ?Goal: Risk for activity intolerance will decrease ?Outcome: Completed/Met ?  ?Problem: Nutrition: ?Goal: Adequate nutrition will be maintained ?Outcome: Completed/Met ?  ?Problem: Coping: ?Goal: Level of anxiety will decrease ?Outcome: Completed/Met ?  ?Problem: Elimination: ?Goal: Will not experience complications related to bowel motility ?Outcome: Completed/Met ?Goal: Will not experience complications related to urinary retention ?Outcome: Completed/Met ?  ?Problem: Clinical Measurements: ?Goal: Respiratory complications will improve ?Outcome: Not Applicable ?Goal: Cardiovascular complication will be avoided ?Outcome: Not Applicable ?  ?

## 2021-10-22 NOTE — Anesthesia Preprocedure Evaluation (Deleted)
Anesthesia Evaluation  ?Patient identified by MRN, date of birth, ID band ?Patient awake ? ? ? ?Reviewed: ?Allergy & Precautions, NPO status , Patient's Chart, lab work & pertinent test results, reviewed documented beta blocker date and time  ? ?History of Anesthesia Complications ?Negative for: history of anesthetic complications ? ?Airway ?Mallampati: III ? ?TM Distance: >3 FB ?Neck ROM: Full ? ? ? Dental ?no notable dental hx. ? ?  ?Pulmonary ?neg pulmonary ROS,  ?  ?Pulmonary exam normal ?breath sounds clear to auscultation ? ? ? ? ? ? Cardiovascular ?Exercise Tolerance: Good ?METS: 3 - Mets hypertension, Pt. on medications and Pt. on home beta blockers ?+ Peripheral Vascular Disease  ?Normal cardiovascular exam ?Rhythm:Regular Rate:Normal ? ? ?  ?Neuro/Psych ?negative neurological ROS ? negative psych ROS  ? GI/Hepatic ?negative GI ROS, Neg liver ROS,   ?Endo/Other  ?diabetes, Well Controlled, Insulin DependentHypothyroidism  ? Renal/GU ?negative Renal ROS  ?negative genitourinary ?  ?Musculoskeletal ?negative musculoskeletal ROS ?(+)  ? Abdominal ?  ?Peds ? Hematology ?negative hematology ROS ?(+)   ?Anesthesia Other Findings ?right third foot infection ? Reproductive/Obstetrics ?negative OB ROS ? ?  ? ? ? ? ? ? ? ? ? ? ? ? ? ?  ?  ? ? ? ? ? ? ? ?Anesthesia Physical ? ?Anesthesia Plan ? ?ASA: 3 ? ?Anesthesia Plan: General  ? ?Post-op Pain Management:   ? ?Induction: Intravenous ? ?PONV Risk Score and Plan:  ? ?Airway Management Planned: Natural Airway and Simple Face Mask ? ?Additional Equipment:  ? ?Intra-op Plan:  ? ?Post-operative Plan:  ? ?Informed Consent:  ? ?Plan Discussed with: Anesthesiologist, CRNA and Surgeon ? ?Anesthesia Plan Comments: (Patient consented for risks of anesthesia including but not limited to:  ?- adverse reactions to medications ?- damage to eyes, teeth, lips or other oral mucosa ?- nerve damage due to positioning  ?- sore throat or hoarseness ?-  Damage to heart, brain, nerves, lungs, other parts of body or loss of life ? ?Patient voiced understanding.)  ? ? ? ? ? ?Anesthesia Quick Evaluation ? ?

## 2021-10-22 NOTE — Anesthesia Preprocedure Evaluation (Signed)
Anesthesia Evaluation  ?Patient identified by MRN, date of birth, ID band ?Patient awake ? ? ? ?Reviewed: ?Allergy & Precautions, NPO status , Patient's Chart, lab work & pertinent test results ? ?History of Anesthesia Complications ?Negative for: history of anesthetic complications ? ?Airway ?Mallampati: III ? ?TM Distance: >3 FB ?Neck ROM: Full ? ? ? Dental ? ?(+) Dental Advidsory Given ?  ?Pulmonary ?neg pulmonary ROS,  ?  ?Pulmonary exam normal ?breath sounds clear to auscultation ? ? ? ? ? ? Cardiovascular ?Exercise Tolerance: Good ?METS: 3 - Mets hypertension, Pt. on medications and Pt. on home beta blockers ?(-) angina(-) Past MI negative cardio ROS ?Normal cardiovascular exam(-) dysrhythmias (-) Valvular Problems/Murmurs ?Rhythm:Regular Rate:Normal ? ? ?  ?Neuro/Psych ?negative neurological ROS ? negative psych ROS  ? GI/Hepatic ?negative GI ROS, Neg liver ROS,   ?Endo/Other  ?diabetes, Well Controlled, Insulin DependentHypothyroidism  ? Renal/GU ?negative Renal ROS  ?negative genitourinary ?  ?Musculoskeletal ?negative musculoskeletal ROS ?(+)  ? Abdominal ?  ?Peds ? Hematology ?negative hematology ROS ?(+)   ?Anesthesia Other Findings ?Past Medical History: ?No date: Cataract ?    Comment:  bilateral eyes ?No date: Diabetes mellitus without complication (HCC) ?No date: Heart abnormality ?    Comment:  Enlarged left ventricle ?No date: Hypertension ? ? Reproductive/Obstetrics ?negative OB ROS ? ?  ? ? ? ? ? ? ? ? ? ? ? ? ? ?  ?  ? ? ? ? ? ? ? ? ?Anesthesia Physical ? ?Anesthesia Plan ? ?ASA: 3 ? ?Anesthesia Plan: General  ? ?Post-op Pain Management:   ? ?Induction: Intravenous ? ?PONV Risk Score and Plan: 2 and Propofol infusion, TIVA and Treatment may vary due to age or medical condition ? ?Airway Management Planned: Natural Airway and Nasal Cannula ? ?Additional Equipment:  ? ?Intra-op Plan:  ? ?Post-operative Plan:  ? ?Informed Consent: I have reviewed the patients  History and Physical, chart, labs and discussed the procedure including the risks, benefits and alternatives for the proposed anesthesia with the patient or authorized representative who has indicated his/her understanding and acceptance.  ? ? ? ?Dental Advisory Given ? ?Plan Discussed with: Anesthesiologist, CRNA and Surgeon ? ?Anesthesia Plan Comments: (Patient consented for risks of anesthesia including but not limited to:  ?- adverse reactions to medications ?- damage to eyes, teeth, lips or other oral mucosa ?- nerve damage due to positioning  ?- sore throat or hoarseness ?- Damage to heart, brain, nerves, lungs, other parts of body or loss of life ? ?Patient voiced understanding.)  ? ? ? ? ? ? ?Anesthesia Quick Evaluation ? ?

## 2021-10-22 NOTE — Op Note (Signed)
PODIATRY / FOOT AND ANKLE SURGERY OPERATIVE REPORT ? ? ? ?SURGEON: Caroline More, DPM ? ?PRE-OPERATIVE DIAGNOSIS:  ?1.  Right third toe and metatarsal phalangeal joint osteomyelitis with abscess and associated wound that probes to bone ?2.  Right foot cellulitis ?3.  Left second toe PIPJ ulceration with bone exposed consistent with osteomyelitis ?4  Left IPJ hallux plantar medial subcutaneous ulceration ?5.  Chronic osteomyelitis left second toe PIPJ and left hallux ?6.  Left hallux valgus ?7.  Left second toe hammer digit contracture ?8.  Diabetes type 2 polyneuropathy ? ?POST-OPERATIVE DIAGNOSIS: Same ? ?PROCEDURE(S): ?Right partial third ray amputation ?Left second PIPJ arthroplasty with resection of bone and excision of wound with closure and extensor tendon release ?Left IPJ hallux wound debridement ? ?HEMOSTASIS: Ankle tourniquet ? ?ANESTHESIA: MAC ? ?ESTIMATED BLOOD LOSS: 20 cc ? ?FINDING(S): ?1.  Abscess and exposed bone consistent with osteomyelitis to the right third toe and metatarsal phalangeal joint ?2.  Chronic osteomyelitis to the left second toe PIPJ ? ?PATHOLOGY/SPECIMEN(S):   ?1.  Right third ray, proximal margin marked in purple ink, culture also taken ?2.  Left PIPJ, proximal margin purple ink ? ?INDICATIONS:   ?Alexander Gill is a 66 y.o. male who presents with multiple ulcerations to both feet.  Patient was seen in clinic with Dr. Cleda Mccreedy and was noted to have osteomyelitis present to the right third toe as well as left second toe.  Patient recently had his right fourth toe amputated by Dr. Cleda Mccreedy due to worsening infection.  Patient has also had a history of chronic osteomyelitis to the left foot but has refused amputations in the past and has been rather noncompliant with instructions in general.  Patient has not followed up appropriately with Dr. Cleda Mccreedy as recommended.  Patient had concern also for an abscess present to the right third toe with cellulitic changes.  Patient was sent from clinic to  the hospital for admission for further work-up.  Patient had an MRI performed of the right foot as well as x-ray imaging of both feet.  Osteomyelitis was confirmed in the right third digit and metatarsal head.  Osteomyelitis was also confirmed to the left IPJ hallux and second toe at the PIPJ.  All treatment options were discussed with the patient both conservative and surgical attempts at correction include potential risks and complications at this time patient is elected for surgical procedure described above/below.  No guarantees given.  Patient remains at high risk for limb loss due to history of diabetes, multiple ulcerations, and chronic osteomyelitis. ? ?DESCRIPTION: ?After obtaining full informed written consent, the patient was brought back to the operating room and placed supine upon the operating table.  The patient received IV antibiotics prior to induction.  After obtaining adequate anesthesia, 10 cc of lidocaine 1% plain was injected about the right third ray and 10 cc was also injected about the left first and second toes.  The patient was prepped and draped in the standard fashion. ? ?An Esmarch bandage was used to exsanguinate the right lower extremity and the pneumatic ankle tourniquet was inflated.  Attention was directed to the right third toe and metatarsal phalangeal joint where an incision was made to bone starting over the metatarsal phalangeal joint and a racquet type of incision was made around the third toe at the proximal phalanx base and extended to the plantar aspect of the foot.  A large rush of purulence was noted from the area especially on the plantar aspect.  There appeared  to be an abscess going down the flexor tendon sheath to the metatarsal phalangeal joint.  A culture was taken from this and passed off the operative site.  An extensor tenotomy and capsulotomy was performed followed by release the collateral and suspensory ligaments as well as any connection to the plantar plate  and flexor tendon and the third toe was disarticulated and passed off the operative site.  There appeared to be notable bony degeneration to the area consistent with acute osteomyelitis with once again purulence coming from the flexor tendon sheath or flexor surface of the foot.  The metatarsal head appeared to be slightly gray and dusky so at this time it was determined to also resect the metatarsal head.  A sagittal bone saw was used to make a bone cut into the third metatarsal and the head of the bone was removed and passed off the operative site with the proximal margin marked in purple ink.  The third toe and metatarsal head were passed off and sent off to pathology for further assessment.  The flexor tendon was then cut as far proximally as possible and the extensor tendon was done as well.  The plantar plate was also resected.  Any further necrotic tissue was resected and passed off from the operative site to healthy bleeding tissue.  The surgical site was flushed with copious amounts normal sterile saline.  Vancomycin powder was then placed into the wound.  The deep structures then reapproximated well coapted with 3-0 Vicryl including the subcutaneous tissue.  The skin was then reapproximated well coapted with 3-0 nylon in a combination of simple and horizontal mattress type stitching.  An additional 5 cc of 1% lidocaine plain was injected about the operative site.  A postoperative dressing was applied consisting of Xeroform to the incision line followed by 4 x 4 gauze, gauze roll, Ace wrap.  The pneumatic ankle tourniquet was deflated and a prompt hyperemic response was noted all digits of the right foot. ? ?Attention was directed to the left foot where an Esmarch bandage was used to exsanguinate the left lower extremity and the pneumatic ankle tourniquet was inflated.  The left second toe was then examined and noted to have bone exposed still at the level of the PIPJ.  A elliptical type of incision was  made around the ulceration that was present.  A skin incision was made over the area to the level of bone and the skin and a portion of the extensor tendon were resected and passed off the operative site.  There were notable bony fragments present in the area and the joint appeared to be affected as well consistent with osteomyelitic changes.  These bony fragments were removed with a rongeur.  The extensor tendon was reflected proximally and distally.  The sagittal bone saw was then used to resect the head of the proximal phalanx and the base of the middle phalanx.  The proximal margin was marked in purple ink at the proximal phalanx area.  The surgical site was flushed with copious amounts normal sterile saline.  The second toe was still appearing to be very dorsally contracted so at this time a small percutaneous incision was made near the second metatarsal phalangeal joint and the extensor tendon was released thereby plantarflex the second toe slightly to improve position of the toe.  The surgical sites were both flushed with copious amounts normal sterile saline.  Vancomycin powder was then placed into both incision lines.  The skin was then reapproximated  well coapted with 3-0 nylon in simple type stitching. ? ?Attention was then directed to the IPJ hallux of the left foot where the wound preoperatively measured approximately 3 cm x 1 cm x 0.2 cm, 100% excisional subcutaneous wound debridement was performed with a 15 blade and tissue nipper removing fibrous tissue as well as hyperkeratotic tissue, the wound bed appeared to be 100% granular after debridement.  The surgical site was flushed with copious amounts normal sterile saline.  An additional 5 cc of lidocaine plain was injected about the left hallux and second toe.  Xeroform was placed on the ulcerative site as well as the second toe procedure site followed by 4 x 4 gauze, gauze roll, Ace wrap.   ? ?The pneumatic ankle tourniquet was deflated and a prompt  hyperemic response was noted all digits left foot.  The patient tolerated the procedure and anesthesia well was transferred to recovery room vital signs stable vascular status intact all toes of both fee

## 2021-10-22 NOTE — Consult Note (Signed)
NAME: Alexander Gill  ?DOB: [birth year]  ?MRN: [id number]  ?Date/Time: 10/22/2021 2:02 PM ? ?REQUESTING PROVIDER: Dr.PAtel ?Subjective:  ?REASON FOR CONSULT: foot infection ?? ?Alexander Gill is a 66 y.o. with a history of HLD, hypothyroidism,DM, peripheral neuropathy is been having recurrent /intermittent wounds on both feet for the past year. ?HE is admitted with worsening wound /infection 3rd toe infection of rt foot. HE presented to the podiatrist office yesterday with worsening swelling , drainage and redness of the 3rd toe of the rt foot and was asked to go to the ED for admission and Iv antibiotics ? ?In Dec 2022 he underwent 4 th toe amputation on the rt foot for osteo . Culture then was MSSA. Post surgery treated with PO augmentin and doxy for 14 days. He was followed by Dr.Cline at [facility] clinic and the surgical wound had healed completely- HE had some callus on other toes and they were being debrided ? ? In July 2022 he had wound on the 3rd toe left foot which was managed by oral antibiotic Augmentin and had healed well ? ?In the ED  BP 133/80.HR 58, Temp 98.4 ?Wbc 13.4, HB 13.9, PLT 311 and creatinine 0.93. ?Blood culture sent ?Wound culture sent and he was started on Vanco and cefepime. ?I am asked to see the patient for the same. ?Patient is feeling fine ?No fever. ?Had some chills last week ?He wears steel toed boots at work and that may be causing friction wounds ? ?Past Medical History:  ?Diagnosis Date  ? Cataract   ? bilateral eyes  ? Diabetes mellitus without complication (HCC)   ? 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 Hypertension   ?  ?Past Surgical History:  ?Procedure Laterality Date  ? AMPUTATION TOE Right 07/05/2021  ? Procedure: AMPUTATION RIGHT 4TH TOE;  Surgeon: Linus Galas, DPM;  Location: [facility] ORS;  Service: Podiatry;  Laterality: Right;  ? HERNIA REPAIR    ?  ?Social History  ? ?Socioeconomic History  ? Marital status: Married  ?  Spouse name: Not on file  ? Number of children:  Not on file  ? Years of education: Not on file  ? Highest education level: Not on file  ?Occupational History  ? Not on file  ?Tobacco Use  ? Smoking status: Never  ? Smokeless tobacco: Never  ?Vaping Use  ? Vaping Use: Never used  ?Substance and Sexual Activity  ? Alcohol use: Not Currently  ? Drug use: Not Currently  ? Sexual activity: Not Currently  ?Other Topics Concern  ? Not on file  ?Social History Narrative  ? Not on file  ? ?Social Determinants of Health  ? ?Financial Resource Strain: Not on file  ?Food Insecurity: Not on file  ?Transportation Needs: Not on file  ?Physical Activity: Not on file  ?Stress: Not on file  ?Social Connections: Not on file  ?Intimate Partner Violence: Not on file  ?  ?Family History  ?Problem Relation Age of Onset  ? Diabetes Mother   ? Diabetes Father   ? Parkinson's disease Brother   ? ?No Known Allergies ?I? ?Current Facility-Administered Medications  ?Medication Dose Route Frequency Provider Last Rate Last Admin  ? acetaminophen (TYLENOL) tablet 650 mg  650 mg Oral Q6H PRN Lynn Ito, MD   650 mg at 10/22/21 1303  ? Or  ? acetaminophen (TYLENOL) suppository 650 mg  650 mg Rectal Q6H PRN Lynn Ito, MD      ? atenolol (TENORMIN) tablet 25  mg  25 mg Oral Daily Enedina Finner, MD      ? bisacodyl (DULCOLAX) suppository 10 mg  10 mg Rectal Daily PRN Lynn Ito, MD      ? ceFEPIme (MAXIPIME) 2 g in sodium chloride 0.9 % 100 mL IVPB  2 g Intravenous Q8H Rauer, Samantha O, RPH 200 mL/hr at 10/22/21 0955 2 g at 10/22/21 0955  ? chlorhexidine (HIBICLENS) 4 % liquid 4 application.  60 mL Topical Once Rosetta Posner, DPM      ? cholecalciferol (VITAMIN D3) tablet 1,000 Units  1,000 Units Oral Daily Enedina Finner, MD      ? enoxaparin (LOVENOX) injection 40 mg  40 mg Subcutaneous QHS Lynn Ito, MD   40 mg at 10/21/21 2248  ? insulin aspart (novoLOG) injection 0-15 Units  0-15 Units Subcutaneous Q4H Lynn Ito, MD   2 Units at 10/22/21 0400  ? ketorolac (TORADOL) 30 MG/ML injection 30  mg  30 mg Intravenous Q6H PRN Lynn Ito, MD      ? levothyroxine (SYNTHROID) tablet 25 mcg  25 mcg Oral Daily Enedina Finner, MD      ? multivitamin with minerals tablet 1 tablet  1 tablet Oral Daily Lynn Ito, MD      ? ondansetron (ZOFRAN) tablet 4 mg  4 mg Oral Q6H PRN Lynn Ito, MD      ? Or  ? ondansetron (ZOFRAN) injection 4 mg  4 mg Intravenous Q6H PRN Lynn Ito, MD      ? rosuvastatin (CRESTOR) tablet 20 mg  20 mg Oral Daily Enedina Finner, MD      ? senna-docusate (Senokot-S) tablet 1 tablet  1 tablet Oral QHS PRN Lynn Ito, MD      ? vancomycin (VANCOREADY) IVPB 1250 mg/250 mL  1,250 mg Intravenous Q12H Rauer, Samantha O, RPH 166.7 mL/hr at 10/22/21 0812 1,250 mg at 10/22/21 9562  ?  ? ?Abtx:  ?Anti-infectives (From admission, onward)  ? ? Start     Dose/Rate Route Frequency Ordered Stop  ? 10/22/21 0800  vancomycin (VANCOREADY) IVPB 1250 mg/250 mL  Status:  Discontinued       ? 1,250 mg ?166.7 mL/hr over 90 Minutes Intravenous Every 12 hours 10/21/21 1750 10/21/21 1752  ? 10/22/21 0800  vancomycin (VANCOREADY) IVPB 1250 mg/250 mL       ? 1,250 mg ?166.7 mL/hr over 90 Minutes Intravenous Every 12 hours 10/21/21 1752    ? 10/22/21 0200  ceFEPIme (MAXIPIME) 2 g in sodium chloride 0.9 % 100 mL IVPB  Status:  Discontinued       ? 2 g ?200 mL/hr over 30 Minutes Intravenous Every 8 hours 10/21/21 1750 10/21/21 1752  ? 10/22/21 0200  ceFEPIme (MAXIPIME) 2 g in sodium chloride 0.9 % 100 mL IVPB       ? 2 g ?200 mL/hr over 30 Minutes Intravenous Every 8 hours 10/21/21 1752    ? 10/21/21 1800  vancomycin (VANCOCIN) IVPB 1000 mg/200 mL premix       ?See Hyperspace for full Linked Orders Report.  ? 1,000 mg ?200 mL/hr over 60 Minutes Intravenous  Once 10/21/21 1656 10/21/21 2147  ? 10/21/21 1745  vancomycin (VANCOCIN) IVPB 1000 mg/200 mL premix  Status:  Discontinued       ? 1,000 mg ?200 mL/hr over 60 Minutes Intravenous  Once 10/21/21 1738 10/21/21 1747  ? 10/21/21 1745  ceFEPIme (MAXIPIME) 2 g in sodium  chloride 0.9 % 100 mL IVPB  Status:  Discontinued       ?  2 g ?200 mL/hr over 30 Minutes Intravenous  Once 10/21/21 1738 10/21/21 1742  ? 10/21/21 1700  ceFEPIme (MAXIPIME) 2 g in sodium chloride 0.9 % 100 mL IVPB       ? 2 g ?200 mL/hr over 30 Minutes Intravenous  Once 10/21/21 1656 10/21/21 1824  ? 10/21/21 1700  vancomycin (VANCOCIN) IVPB 1000 mg/200 mL premix       ?See Hyperspace for full Linked Orders Report.  ? 1,000 mg ?200 mL/hr over 60 Minutes Intravenous  Once 10/21/21 1656 10/21/21 1926  ? ?  ? ? ?REVIEW OF SYSTEMS:  ?Const: negative fever, chills, negative weight loss ?Eyes: negative diplopia or visual changes, negative eye pain ?ENT: negative coryza, negative sore throat ?Resp: negative cough, hemoptysis, dyspnea ?Cards: negative for chest pain, palpitations, lower extremity edema ?GU: negative for frequency, dysuria and hematuria ?GI: Negative for abdominal pain, diarrhea, bleeding, constipation ?Skin: negative for rash and pruritus ?Heme: negative for easy bruising and gum/nose bleeding ?MS: negative for myalgias, arthralgias, back pain and muscle weakness ?Neurolo:negative for headaches, dizziness, vertigo, memory problems  ?Psych: negative for feelings of anxiety, depression  ?Endocrine:  thyroid, diabetes ?Allergy/Immunology- negative for any medication or food allergies ? ?Objective:  ?VITALS:  ?BP 133/79 (BP Location: Right Arm)   Pulse (!) 54   Temp 98.4 ?F (36.9 ?C)   Resp 18   Ht 6' (1.829 m)   Wt 88.5 kg   SpO2 100%   BMI 26.46 kg/m?  ?PHYSICAL EXAM:  ?General: Alert, cooperative, no distress, appears stated age.  ?Head: Normocephalic, without obvious abnormality, atraumatic. ?Eyes: Conjunctivae clear, anicteric sclerae. Pupils are equal ?ENT Nares normal. No drainage or sinus tenderness. ?Lips, mucosa, and tongue normal. No Thrush ?Neck: Supple, symmetrical, no adenopathy, thyroid: non tender ?no carotid bruit and no JVD. ?Back: No CVA tenderness. ?Lungs: Clear to auscultation  bilaterally. No Wheezing or Rhonchi. No rales. ?Heart: Regular rate and rhythm, no murmur, rub or gallop. ?Abdomen: Soft, non-tender,not distended. Bowel sounds normal. No masses ?Extremities: Right foot ?Fourth toe

## 2021-10-22 NOTE — Progress Notes (Signed)
[facility]  - [place] [facility] ? ? ?PATIENT NAME: Alexander Gill   ? ?MR#:  [id number] ? ?DATE OF BIRTH:  05/20/1956 ? ?SUBJECTIVE:  ?patient came in after he noted right toes getting red with pain had some fever with chills. Was seen outpatient with podiatry and noted to have worsening infection. Patient has had infection in the left foot as well requiring debridement and amputation of his toe. No family at bedside. ? ? ? ?VITALS:  ?Blood pressure 133/79, pulse (!) 54, temperature 98.4 ?F (36.9 ?C), resp. rate 18, height 6' (1.829 m), weight 88.5 kg, SpO2 100 %. ? ?PHYSICAL EXAMINATION:  ? ?GENERAL:  66 y.o.-year-old patient lying in the bed with no acute distress.  ?LUNGS: Normal breath sounds bilaterally, no wheezing, rales, rhonchi.  ?CARDIOVASCULAR: S1, S2 normal. No murmurs, rubs, or gallops.  ?ABDOMEN: Soft, nontender, nondistended. Bowel sounds present.  ?EXTREMITIES: bilateral foot dressing + ?NEUROLOGIC: nonfocal  patient is alert and awake ?SKIN: No obvious rash, lesion, or ulcer.  ? ?LABORATORY PANEL:  ?CBC ?Recent Labs  ?Lab 10/21/21 ?1217  ?WBC 13.4*  ?HGB 13.9  ?HCT 42.8  ?PLT 311  ? ? ?Chemistries  ?Recent Labs  ?Lab 10/21/21 ?1217 10/22/21 ?[id number]  ?NA 139  --   ?K 3.6  --   ?CL 103  --   ?CO2 24  --   ?GLUCOSE 158*  --   ?BUN 19  --   ?CREATININE 0.93 0.78  ?CALCIUM 9.3  --   ?AST 19  --   ?ALT 17  --   ?ALKPHOS 64  --   ?BILITOT 0.6  --   ? ?Cardiac Enzymes ?No results for input(s): TROPONINI in the last 168 hours. ?RADIOLOGY:  ?MR FOOT RIGHT W WO CONTRAST ? ?Result Date: 10/22/2021 ?CLINICAL DATA:  Worsening infection of the right toes, diabetes EXAM: MRI OF THE RIGHT FOREFOOT WITHOUT AND WITH CONTRAST TECHNIQUE: Multiplanar, multisequence MR imaging of the Multiple exams, including radiographs 10/21/2021 was performed before and after the administration of intravenous contrast. CONTRAST:  8mL GADAVIST GADOBUTROL 1 MMOL/ML IV SOLN COMPARISON:  None. FINDINGS: Despite efforts by the  technologist and patient, motion artifact is present on today's exam and could not be eliminated. This reduces exam sensitivity and specificity. Bones/Joint/Cartilage Prior amputation, fourth toe. Subtle enhancement in the head of the fourth metatarsal adjacent to hypoenhancing plantar tissues of the ball of foot, probably reactive. Fused interphalangeal joint of the great toe. Bony destructive findings of the distal phalanx third toe and middle phalanx third toe with septic and fluid distended proximal interphalangeal joint with marginal synovial enhancement. A track of fluid from the proximal interphalangeal joint appears to extend distally along the middle phalanx and exit to the cutaneous surface of the toe as shown on images 9 through 12 of series 13. Subtle enhancement in the head of the third metatarsal may be reactive or due to early osteomyelitis. No osteomyelitis of the second or fifth toe identified. No metatarsal involvement or first digit sesamoid involvement. Ligaments Lisfranc ligament intact. Muscles and Tendons Generalized edema tracking along regional musculature, neurogenic versus myositis. Soft tissues Extensive subcutaneous edema and enhancement in the forefoot especially distally and extending into the third toe. Along the ball of the foot and partially extending into the small toe, a 3.8 by 2.0 by 6.1 cm region of hypoenhancing tissue may represent necrotic or nonviable soft tissues, although there is no gas in this tissue currently. IMPRESSION: 1. Extensive osteomyelitis, third toe as detailed  above with sinus tract from the proximal interphalangeal joint extending distally into the toe and exiting to the cutaneous surface. Subtle enhancement in the head of the third metatarsal may be reactive or due to early osteomyelitis. 2. Extensive subcutaneous edema and enhancement in the forefoot favoring cellulitis. Edema and enhancement in regional musculature could be neurogenic or from myositis. 3.  Substantial region of hypoenhancement in the soft tissues of the ball of the foot may represent necrotic or nonviable soft tissues, although there is no gas infiltrating is tissue currently. 4. Prior fourth toe amputation. Electronically Signed   By: Gaylyn Rong M.D.   On: 10/22/2021 06:28  ? ?DG Foot Complete Left ? ?Result Date: 10/21/2021 ?CLINICAL DATA:  injury EXAM: LEFT FOOT - COMPLETE 3+ VIEW COMPARISON:  Radiograph 01/15/2021 FINDINGS: There is periarticular bone destruction at the great toe interphalangeal joint, with increased in extent though with also increased sclerosis in comparison to prior exam. There is new periarticular bony destruction at the proximal phalangeal joint of the second toe. There is new periarticular bony destruction at the distal interphalangeal joint of the third toe. IMPRESSION: New periarticular bone destruction at the PIP joint of the second digit and DIP joint of the third digit concerning for osteomyelitis/septic arthritis. Sequelae of great toe IP joint septic arthritis with periarticular lucency and sclerosis consistent with chronic osteomyelitis. Electronically Signed   By: Caprice Renshaw M.D.   On: 10/21/2021 13:11  ? ?DG Foot Complete Right ? ?Result Date: 10/21/2021 ?CLINICAL DATA:  injury EXAM: RIGHT FOOT COMPLETE - 3+ VIEW COMPARISON:  Right foot radiograph 07/04/2021 FINDINGS: Prior fourth toe amputation. There is bony destruction of the third toe middle and distal phalanx. There is dislocation at the PIP joint of the third toe. There is significant soft tissue swelling of the third toe. Mild lucency of the fourth metatarsal head. Unchanged partial bony fusion at the great toe IP joint. Mild first MTP osteoarthritis. There is also generalized soft tissue swelling of the right foot. IMPRESSION: Soft tissue swelling of the third toe with bony destruction of the middle and distal phalanges, concerning for osteomyelitis/pathologic fracture. Prior fourth toe amputation.  Mild lucency of the fourth metatarsal head, nonspecific without obvious bony erosion radiographically, osteomyelitis cannot be completely excluded. Electronically Signed   By: Caprice Renshaw M.D.   On: 10/21/2021 13:07   ? ?Assessment and Plan ? ? Kimo Bancroft is a 66 y.o. male  with medical history significant of hypertension, hyperlipidemia, diabetes mellitus, hypothyroidism was sent from his podiatrist office for right third foot infection.  He had presented to his office with pain, worsening drainage swelling and redness. ? ?X-ray of the left foot:  ?New periarticular bone destruction at the PIP joint of the second ?digit and DIP joint of the third digit concerning for ?osteomyelitis/septic arthritis. ?Sequelae of great toe IP joint septic arthritis with periarticular ?lucency and sclerosis consistent with chronic osteomyelitis. ?  ? X-ray of right foot:  ?Soft tissue swelling of the third toe with bony destruction of the ?middle and distal phalanges, concerning for osteomyelitis/pathologic ?fracture.Prior fourth toe amputation. Mild lucency of the fourth metatarsal head. ? ?Right lower extremity cellulitis ?right second and third toe osteomyelitis/septic arthritis ?-- currently on broad-spectrum antibiotic ?-- podiatry consultation with Dr. Excell Seltzer-- plans for surgery today ?-- infectious disease consultation help with antibiotic ?-- blood cultures negative ? ?type II diabetes with hyperglycemia, diabetic neuropathy ?-- continue sliding scale ?-- will resume home meds after surgery ? ?Hypothyroidism ?-- continue Synthroid ? ?  Hypertension ?-- continue statin ? ? ? ? ?Procedures: ?Family communication : none ?Consults : podiatry, ID ?CODE STATUS: full ?DVT Prophylaxis : Lovenox ?Level of care: Med-Surg ?Status is: Inpatient ?Remains inpatient appropriate because: diabetic foot ulcer. Pending surgery ?  ? ?TOTAL TIME TAKING CARE OF THIS PATIENT: 25 minutes.  ?>50% time spent on counselling and coordination of  care ? ?Note: This dictation was prepared with Dragon dictation along with smaller phrase technology. Any transcriptional errors that result from this process are unintentional. ? ?Enedina Finner M.D  ? ? ?[facility]

## 2021-10-22 NOTE — Anesthesia Postprocedure Evaluation (Signed)
Anesthesia Post Note ? ?Patient: Alexander Gill ? ?Procedure(s) Performed: AMPUTATION RAY-PARTIAL 3RD RAY (Right) ?IRRIGATION AND DEBRIDEMENT WOUND (Left: Foot) ?TOE ARTHROPLASTY (Left: Toe) ? ?Patient location during evaluation: PACU ?Anesthesia Type: General ?Level of consciousness: awake and alert ?Pain management: pain level controlled ?Vital Signs Assessment: post-procedure vital signs reviewed and stable ?Respiratory status: spontaneous breathing, nonlabored ventilation, respiratory function stable and patient connected to nasal cannula oxygen ?Cardiovascular status: blood pressure returned to baseline and stable ?Postop Assessment: no apparent nausea or vomiting ?Anesthetic complications: no ? ? ?No notable events documented. ? ? ?Last Vitals:  ?Vitals:  ? 10/22/21 1945 10/22/21 2005  ?BP: (!) 148/85 (!) 155/82  ?Pulse: (!) 56 (!) 51  ?Resp: 12 15  ?Temp: 37 ?C 36.8 ?C  ?SpO2: 97% 99%  ?  ?Last Pain:  ?Vitals:  ? 10/22/21 1945  ?TempSrc:   ?PainSc: 0-No pain  ? ? ?  ?  ?  ?  ?  ?  ? ?Lenard Simmer ? ? ? ? ?

## 2021-10-22 NOTE — Transfer of Care (Addendum)
Immediate Anesthesia Transfer of Care Note ? ?Patient: Alexander Gill ? ?Procedure(s) Performed: AMPUTATION RAY-PARTIAL 3RD RAY (Right) ? ?Patient Location: PACU ? ?Anesthesia Type:General ? ?Level of Consciousness: drowsy ? ?Airway & Oxygen Therapy: Patient Spontanous Breathing and Patient connected to nasal cannula oxygen ? ?Post-op Assessment: Report given to RN and Post -op Vital signs reviewed and stable ? ?Post vital signs: Reviewed and stable ? ?Last Vitals:  ?Vitals Value Taken Time  ?BP    ?Temp    ?Pulse    ?Resp    ?SpO2    ? ? ?Last Pain:  ?Vitals:  ? 10/22/21 1710  ?TempSrc: Oral  ?PainSc: 0-No pain  ?   ? ?Patients Stated Pain Goal: 0 (10/22/21 [id number]) ? ?Complications: No notable events documented. ?

## 2021-10-22 NOTE — Consult Note (Addendum)
PODIATRY / FOOT AND ANKLE SURGERY CONSULTATION NOTE ? ?Requesting Physician: Dr Posey Pronto ? ?Reason for consult: Right foot infection ? ?Chief Complaint: Right foot wound/infection ?  ?HPI: Alexander Gill is a 66 y.o. male who presents with a nonhealing ulceration to the right third toe.  Patient was seen by Dr. Cleda Mccreedy yesterday in clinic and was noted to have worsening signs of infection of the right third toe consistent with bone infection and potentially septic joint along with cellulitis.  Patient previously has a history of right fourth toe amputation due to osteomyelitis.  He also has chronic osteomyelitis to his left big toe and left second toe which are stable at this time.  Patient has refused amputation on the left side as his wounds have improved with antibiotics and local wound care despite recommendation for amputation to remove infection.  Patient noticed that his foot was red, hot, and swollen for the past couple of days and then came in to clinic to see Dr. Cleda Mccreedy and was noted to have worsening infection so he was sent to the emergency room for admission and further work-up.  Patient did have some degree of nausea with chills but did not have any fever.  Patient presents today wearing dressings to both feet. ? ?PMHx:  ?Past Medical History:  ?Diagnosis Date  ? Cataract   ? bilateral eyes  ? Diabetes mellitus without complication ([place])   ? 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 Hypertension   ? ? ?Surgical Hx:  ?Past Surgical History:  ?Procedure Laterality Date  ? AMPUTATION TOE Right 07/05/2021  ? Procedure: AMPUTATION RIGHT 4TH TOE;  Surgeon: Sharlotte Alamo, DPM;  Location: [facility] ORS;  Service: Podiatry;  Laterality: Right;  ? HERNIA REPAIR    ? ? ?FHx:  ?Family History  ?Problem Relation Age of Onset  ? Diabetes Mother   ? Diabetes Father   ? Parkinson's disease Brother   ? ? ?Social History:  reports that he has never smoked. He has never used smokeless tobacco. He reports that he does not  currently use alcohol. He reports that he does not currently use drugs. ? ?Allergies: No Known Allergies ? ?Medications Prior to Admission  ?Medication Sig Dispense Refill  ? aspirin EC 81 MG tablet Take 81 mg by mouth daily. Swallow whole.    ? atenolol (TENORMIN) 25 MG tablet Take 25 mg by mouth daily.    ? cholecalciferol (VITAMIN D3) 25 MCG (1000 UNIT) tablet Take 1,000 Units by mouth daily.    ? Dapagliflozin-metFORMIN HCl ER (XIGDUO XR) 11-998 MG TB24 Take 1 tablet by mouth daily at 2 am.    ? Insulin Glargine (BASAGLAR KWIKPEN [place]) Inject 36 Units into the skin daily.    ? levothyroxine (SYNTHROID) 25 MCG tablet Take 25 mcg by mouth daily.    ? rosuvastatin (CRESTOR) 20 MG tablet Take 20 mg by mouth daily.    ? LANTUS 100 UNIT/ML injection Inject 30 Units into the skin daily. (Patient not taking: Reported on 10/21/2021)    ? losartan-hydrochlorothiazide (HYZAAR) 100-25 MG tablet Take 1 tablet by mouth every morning. (Patient not taking: Reported on 07/04/2021)    ? repaglinide (PRANDIN) 1 MG tablet Take 1 mg by mouth daily. Take 30 minutes prior to dinner. (Patient not taking: Reported on 10/21/2021)    ? ? ?Physical Exam: ?General: Alert and oriented.  No apparent distress. ? ?Vascular: DP/PT pulses palpable bilateral, capillary fill time intact to digits bilaterally. ? ?Neuro: Light touch  sensation absent to bilateral lower extremities. ? ?Derm: Ulceration present to the dorsal lateral aspect of the right third toe around the middle phalanx area with obvious bone exposed, seropurulent drainage coming from the wound, mild odor, right third toe appears to be grossly swollen to the metatarsal phalangeal joint with some slight swelling as well to the metatarsal phalangeal joint area and dorsal forefoot along with associated erythema. ? ?Ulceration also present to the dorsal aspect of the left second toe at the PIPJ and plantar lateral left IPJ hallux.  Both wounds appear to be stable and granular with minimal  hyperkeratotic buildup, no bone exposed, no obvious signs of infection present acutely.  L 2nd toe PIPJ wound appears to have some bone exposed to the area but no associated erythema or edema. ? ?MSK: Left second hammertoe.  Left hallux valgus with hallux malleus. ? ?Results for orders placed or performed during the hospital encounter of 10/21/21 (from the past 48 hour(s))  ?Lactic acid, plasma     Status: None  ? Collection Time: 10/21/21 12:16 PM  ?Result Value Ref Range  ? Lactic Acid, Venous 1.5 0.5 - 1.9 mmol/L  ?  Comment: Performed at [facility], [address] [place], [place] [address]  ?Comprehensive metabolic panel     Status: Abnormal  ? Collection Time: 10/21/21 12:17 PM  ?Result Value Ref Range  ? Sodium 139 135 - 145 mmol/L  ? Potassium 3.6 3.5 - 5.1 mmol/L  ? Chloride 103 98 - 111 mmol/L  ? CO2 24 22 - 32 mmol/L  ? Glucose, Bld 158 (H) 70 - 99 mg/dL  ?  Comment: Glucose reference range applies only to samples taken after fasting for at least 8 hours.  ? BUN 19 8 - 23 mg/dL  ? Creatinine, Ser 0.93 0.61 - 1.24 mg/dL  ? Calcium 9.3 8.9 - 10.3 mg/dL  ? Total Protein 8.6 (H) 6.5 - 8.1 g/dL  ? Albumin 3.6 3.5 - 5.0 g/dL  ? AST 19 15 - 41 U/L  ? ALT 17 0 - 44 U/L  ? Alkaline Phosphatase 64 38 - 126 U/L  ? Total Bilirubin 0.6 0.3 - 1.2 mg/dL  ? GFR, Estimated >60 >60 mL/min  ?  Comment: (NOTE) ?Calculated using the CKD-EPI Creatinine Equation (2021) ?  ? Anion gap 12 5 - 15  ?  Comment: Performed at [facility], [address], [place], [place] [address]  ?CBC with Differential     Status: Abnormal  ? Collection Time: 10/21/21 12:17 PM  ?Result Value Ref Range  ? WBC 13.4 (H) 4.0 - 10.5 K/uL  ? RBC 4.97 4.22 - 5.81 MIL/uL  ? Hemoglobin 13.9 13.0 - 17.0 g/dL  ? HCT 42.8 39.0 - 52.0 %  ? MCV 86.1 80.0 - 100.0 fL  ? MCH 28.0 26.0 - 34.0 pg  ? MCHC 32.5 30.0 - 36.0 g/dL  ? RDW 13.6 11.5 - 15.5 %  ? Platelets 311 150 - 400 K/uL  ? nRBC 0.0 0.0 - 0.2 %  ? Neutrophils Relative % 78 %  ?  Neutro Abs 10.4 (H) 1.7 - 7.7 K/uL  ? Lymphocytes Relative 13 %  ? Lymphs Abs 1.8 0.7 - 4.0 K/uL  ? Monocytes Relative 6 %  ? Monocytes Absolute 0.8 0.1 - 1.0 K/uL  ? Eosinophils Relative 3 %  ? Eosinophils Absolute 0.4 0.0 - 0.5 K/uL  ? Basophils Relative 0 %  ? Basophils Absolute 0.1 0.0 - 0.1 K/uL  ?  Immature Granulocytes 0 %  ? Abs Immature Granulocytes 0.06 0.00 - 0.07 K/uL  ?  Comment: Performed at [facility], [address] [place], [place] [address]  ?Blood culture (routine x 2)     Status: None (Preliminary result)  ? Collection Time: 10/21/21 12:17 PM  ? Specimen: BLOOD  ?Result Value Ref Range  ? Specimen Description BLOOD BLOOD RIGHT FOREARM   ? Special Requests    ?  BOTTLES DRAWN AEROBIC AND ANAEROBIC Blood Culture results may not be optimal due to an excessive volume of blood received in culture bottles  ? Culture    ?  NO GROWTH < 24 HOURS ?Performed at [facility], [address], [place], [place] [address] ?  ? Report Status PENDING   ?Blood culture (routine x 2)     Status: None (Preliminary result)  ? Collection Time: 10/21/21  5:40 PM  ? Specimen: BLOOD  ?Result Value Ref Range  ? Specimen Description BLOOD LEFT FOREARM   ? Special Requests    ?  BOTTLES DRAWN AEROBIC AND ANAEROBIC Blood Culture adequate volume  ? Culture    ?  NO GROWTH < 24 HOURS ?Performed at [facility], [address] [place],  [address] ?  ? Report Status PENDING   ?Resp Panel by RT-PCR (Flu A&B, Covid) Nasopharyngeal Swab     Status: None  ? Collection Time: 10/21/21  6:35 PM  ? Specimen: Nasopharyngeal Swab; Nasopharyngeal(NP) swabs in vial transport medium  ?Result Value Ref Range  ? SARS Coronavirus 2 by RT PCR NEGATIVE NEGATIVE  ?  Comment: (NOTE) ?SARS-CoV-2 target nucleic acids are NOT DETECTED. ? ?The SARS-CoV-2 RNA is generally detectable in upper respiratory ?specimens during the acute phase of infection. The lowest ?concentration of SARS-CoV-2 viral copies this assay  can detect is ?138 copies/mL. A negative result does not preclude SARS-Cov-2 ?infection and should not be used as the sole basis for treatment or ?other patient management decisions. A negative result may occur with  ?i

## 2021-10-23 ENCOUNTER — Encounter: Payer: Self-pay | Admitting: Podiatry

## 2021-10-23 DIAGNOSIS — E11628 Type 2 diabetes mellitus with other skin complications: Secondary | ICD-10-CM | POA: Diagnosis not present

## 2021-10-23 DIAGNOSIS — L03115 Cellulitis of right lower limb: Secondary | ICD-10-CM | POA: Diagnosis not present

## 2021-10-23 DIAGNOSIS — E1169 Type 2 diabetes mellitus with other specified complication: Secondary | ICD-10-CM

## 2021-10-23 DIAGNOSIS — M86171 Other acute osteomyelitis, right ankle and foot: Secondary | ICD-10-CM | POA: Diagnosis not present

## 2021-10-23 DIAGNOSIS — L089 Local infection of the skin and subcutaneous tissue, unspecified: Secondary | ICD-10-CM | POA: Diagnosis not present

## 2021-10-23 LAB — GLUCOSE, CAPILLARY
Glucose-Capillary: 202 mg/dL — ABNORMAL HIGH (ref 70–99)
Glucose-Capillary: 140 mg/dL — ABNORMAL HIGH (ref 70–99)
Glucose-Capillary: 102 mg/dL — ABNORMAL HIGH (ref 70–99)
Glucose-Capillary: 146 mg/dL — ABNORMAL HIGH (ref 70–99)
Glucose-Capillary: 256 mg/dL — ABNORMAL HIGH (ref 70–99)
Glucose-Capillary: 297 mg/dL — ABNORMAL HIGH (ref 70–99)
Glucose-Capillary: 167 mg/dL — ABNORMAL HIGH (ref 70–99)

## 2021-10-23 LAB — HEMOGLOBIN A1C
Hgb A1c MFr Bld: 8.3 % — ABNORMAL HIGH (ref 4.8–5.6)
Hgb A1c MFr Bld: 8.2 % — ABNORMAL HIGH (ref 4.8–5.6)
Mean Plasma Glucose: 192 mg/dL
Mean Plasma Glucose: 189 mg/dL

## 2021-10-23 LAB — TSH: TSH: 2.642 u[IU]/mL (ref 0.350–4.500)

## 2021-10-23 MED ORDER — DAPAGLIFLOZIN PRO-METFORMIN ER 5-1000 MG PO TB24: 1.0000 | ORAL_TABLET | Freq: Every day | ORAL | Status: DC

## 2021-10-23 MED ORDER — DAPAGLIFLOZIN PROPANEDIOL 5 MG PO TABS
5.0000 mg | ORAL_TABLET | Freq: Every day | ORAL | Status: DC
  Filled 2021-10-23: qty 1

## 2021-10-23 MED ORDER — METFORMIN HCL ER 500 MG PO TB24
1000.0000 mg | ORAL_TABLET | Freq: Every day | ORAL | Status: DC
  Administered 2021-10-24: 1000 mg via ORAL
  Filled 2021-10-23: qty 2

## 2021-10-23 MED ORDER — INSULIN GLARGINE-YFGN 100 UNIT/ML ~~LOC~~ SOLN
36.0000 [IU] | Freq: Every day | SUBCUTANEOUS | Status: DC
  Administered 2021-10-23: 36 [IU] via SUBCUTANEOUS
  Filled 2021-10-23 (×2): qty 0.36

## 2021-10-23 NOTE — Progress Notes (Signed)
[facility]  - [place] at [facility] ? ? ?PATIENT NAME: Alexander Gill   ? ?MR#:  [id number] ? ?DATE OF BIRTH:  Aug 06, 1955 ? ?SUBJECTIVE:  ?patient is status post right third toe amputation. Getting IV placed. Denies any complaints. Worked with physical therapy ? ? ? ?VITALS:  ?Blood pressure 120/66, pulse (!) 51, temperature 98.2 ?F (36.8 ?C), resp. rate 18, height 6' (1.829 m), weight 86.2 kg, SpO2 99 %. ? ?PHYSICAL EXAMINATION:  ? ?GENERAL:  66 y.o.-year-old patient lying in the bed with no acute distress.  ?LUNGS: Normal breath sounds bilaterally, no wheezing, rales, rhonchi.  ?CARDIOVASCULAR: S1, S2 normal. No murmurs, rubs, or gallops.  ?ABDOMEN: Soft, nontender, nondistended. Bowel sounds present.  ?EXTREMITIES: bilateral foot dressing + with post-op boot ?NEUROLOGIC: nonfocal  patient is alert and awake ?SKIN: No obvious rash, lesion, or ulcer.  ? ?LABORATORY PANEL:  ?CBC ?Recent Labs  ?Lab 10/21/21 ?1217  ?WBC 13.4*  ?HGB 13.9  ?HCT 42.8  ?PLT 311  ? ? ? ?Chemistries  ?Recent Labs  ?Lab 10/21/21 ?1217 10/22/21 ?1856  ?NA 139  --   ?K 3.6  --   ?CL 103  --   ?CO2 24  --   ?GLUCOSE 158*  --   ?BUN 19  --   ?CREATININE 0.93 0.78  ?CALCIUM 9.3  --   ?AST 19  --   ?ALT 17  --   ?ALKPHOS 64  --   ?BILITOT 0.6  --   ? ? ?Cardiac Enzymes ?No results for input(s): TROPONINI in the last 168 hours. ?RADIOLOGY:  ?MR FOOT RIGHT W WO CONTRAST ? ?Result Date: 10/22/2021 ?CLINICAL DATA:  Worsening infection of the right toes, diabetes EXAM: MRI OF THE RIGHT FOREFOOT WITHOUT AND WITH CONTRAST TECHNIQUE: Multiplanar, multisequence MR imaging of the Multiple exams, including radiographs 10/21/2021 was performed before and after the administration of intravenous contrast. CONTRAST:  21mL GADAVIST GADOBUTROL 1 MMOL/ML IV SOLN COMPARISON:  None. FINDINGS: Despite efforts by the technologist and patient, motion artifact is present on today's exam and could not be eliminated. This reduces exam sensitivity and  specificity. Bones/Joint/Cartilage Prior amputation, fourth toe. Subtle enhancement in the head of the fourth metatarsal adjacent to hypoenhancing plantar tissues of the ball of foot, probably reactive. Fused interphalangeal joint of the great toe. Bony destructive findings of the distal phalanx third toe and middle phalanx third toe with septic and fluid distended proximal interphalangeal joint with marginal synovial enhancement. A track of fluid from the proximal interphalangeal joint appears to extend distally along the middle phalanx and exit to the cutaneous surface of the toe as shown on images 9 through 12 of series 13. Subtle enhancement in the head of the third metatarsal may be reactive or due to early osteomyelitis. No osteomyelitis of the second or fifth toe identified. No metatarsal involvement or first digit sesamoid involvement. Ligaments Lisfranc ligament intact. Muscles and Tendons Generalized edema tracking along regional musculature, neurogenic versus myositis. Soft tissues Extensive subcutaneous edema and enhancement in the forefoot especially distally and extending into the third toe. Along the ball of the foot and partially extending into the small toe, a 3.8 by 2.0 by 6.1 cm region of hypoenhancing tissue may represent necrotic or nonviable soft tissues, although there is no gas in this tissue currently. IMPRESSION: 1. Extensive osteomyelitis, third toe as detailed above with sinus tract from the proximal interphalangeal joint extending distally into the toe and exiting to the cutaneous surface. Subtle enhancement in the head of  the third metatarsal may be reactive or due to early osteomyelitis. 2. Extensive subcutaneous edema and enhancement in the forefoot favoring cellulitis. Edema and enhancement in regional musculature could be neurogenic or from myositis. 3. Substantial region of hypoenhancement in the soft tissues of the ball of the foot may represent necrotic or nonviable soft tissues,  although there is no gas infiltrating is tissue currently. 4. Prior fourth toe amputation. Electronically Signed   By: Gaylyn Rong M.D.   On: 10/22/2021 06:28  ? ?DG Foot 2 Views Left ? ?Result Date: 10/22/2021 ?CLINICAL DATA:  Left foot partial amputation EXAM: LEFT FOOT - 2 VIEW COMPARISON:  10/21/2021 FINDINGS: Since the prior examination, there has been partial resection of the proximal aspect of the second middle phalanx. Remote resection of the distal aspect of the second proximal phalanx again noted. Advanced erosive changes noted involving the interphalangeal joint of the great toe with marked lateral angulation. Posttraumatic or postsurgical changes involving the distal phalanx of the third toe again noted. Stable erosive changes involving the second metatarsal head. No acute fracture or dislocation. Vascular calcifications noted. IMPRESSION: Interval partial resection of the proximal aspect of the second middle phalanx. Otherwise stable appearance of the left foot when compared to prior examination of 10/21/2021. Electronically Signed   By: Helyn Numbers M.D.   On: 10/22/2021 20:47  ? ?DG Foot 2 Views Right ? ?Result Date: 10/22/2021 ?CLINICAL DATA:  Right foot amputation EXAM: RIGHT FOOT - 2 VIEW COMPARISON:  None. FINDINGS: Two view radiograph right foot demonstrates resection of the fourth toe distal to the MTP joint and interval transmetatarsal resection of the third toe with partial resection of the third metatarsal head. Stable erosive changes involving the lateral aspect of the fifth metatarsal head. Subcutaneous gas and soft tissue swelling is seen of the right forefoot in keeping with recent surgical resection. Arthrodesis of the interphalangeal joint of the great toe is again noted. No acute fracture or dislocation. IMPRESSION: Interval transmetatarsal resection of the right third digit. Otherwise unchanged appearance when compared to prior examination of 10/21/2021. Electronically Signed    By: Helyn Numbers M.D.   On: 10/22/2021 20:45   ? ?Assessment and Plan ? ? Alexander Gill is a 66 y.o. male  with medical history significant of hypertension, hyperlipidemia, diabetes mellitus, hypothyroidism was sent from his podiatrist office for right third foot infection.  He had presented to his office with pain, worsening drainage swelling and redness. ? ?X-ray of the left foot:  ?New periarticular bone destruction at the PIP joint of the second ?digit and DIP joint of the third digit concerning for ?osteomyelitis/septic arthritis. ?Sequelae of great toe IP joint septic arthritis with periarticular ?lucency and sclerosis consistent with chronic osteomyelitis. ?  ? X-ray of right foot:  ?Soft tissue swelling of the third toe with bony destruction of the ?middle and distal phalanges, concerning for osteomyelitis/pathologic ?fracture.Prior fourth toe amputation. Mild lucency of the fourth metatarsal head. ? ?Right lower extremity cellulitis ?right second and third toe osteomyelitis/septic arthritis ?-- currently on broad-spectrum antibiotic ?-- podiatry consultation with Dr. Excell Seltzer-  ?s/p Right partial third ray amputation ?Left second PIPJ arthroplasty with resection of bone and excision of wound with closure and extensor tendon release ?Left IPJ hallux wound debridement ?-- infectious disease consultation help with antibiotic ?-- blood cultures negative ?--Deep wound culture-- colonies too small to read ? ?type II diabetes with hyperglycemia, diabetic neuropathy ?-- continue sliding scale ?-- will resume home meds  ? ?Hypothyroidism ?--  continue Synthroid ? ?Hypertension ?-- continue statin ? ? ? ? ?Procedures:as above ?Family communication : none ?Consults : podiatry, ID ?CODE STATUS: full ?DVT Prophylaxis : Lovenox ?Level of care: Med-Surg ?Status is: Inpatient ?Remains inpatient appropriate because: diabetic foot ulcer.  ?Anticipate discharge in 1 to 2 days ? ?TOTAL TIME TAKING CARE OF THIS PATIENT: 25  minutes.  ?>50% time spent on counselling and coordination of care ? ?Note: This dictation was prepared with Dragon dictation along with smaller phrase technology. Any transcriptional errors that result from this proce

## 2021-10-23 NOTE — Evaluation (Signed)
Physical Therapy Evaluation ?Patient Details ?Name: Alexander Gill ?MRN: [id number] ?DOB: November 14, 1955 ?Today's Date: 10/23/2021 ? ?History of Present Illness ? Patient is a 66 year old male with a PMH (+) for DM, HTN, HLD, hypothyroidism and osteomyelitis of the right foot. Patient reported to [facility] with a worsening infection of the R 3rd toe. Right 3rd partial ray amputation completed on 10/22/21. ?  ?Clinical Impression ? Physical Therapy Evaluation completed this date. Patient tolerated session well and was agreeable to treatment. Mild 1/10 pain noted throughout session that did not increase with physical activity. Patient lives in a 2 story home with his wife, however mainly lives on the first floor. Patient still drives and works, and did not use any AD prior to hospitalization. Patient was Mod I/ Independent with all ADLs and mobility.  ? ?Patient demonstrated [facility] [address] for BUE and BLE strength. All bed mobility, sit to stand transfers, and ambulation was completed Mod I with no AD needed. Weightbearing precautions were followed throughout entire session with B post op shoes worn. Patient required supervision for steps with tactile cueing from B hand rails that progressed to no hand rail assist. Ascending steps were completed in a step to pattern while descending was completed through a step through pattern. Patient is demonstrating at/near baseline level of function. At this time patient does not require additional skilled physical therapy post discharge from acute hospitalization. Signing off.  ?   ? ?Recommendations for follow up therapy are one component of a multi-disciplinary discharge planning process, led by the attending physician.  Recommendations may be updated based on patient status, additional functional criteria and insurance authorization. ? ?Follow Up Recommendations No PT follow up ? ?  ?Assistance Recommended at Discharge PRN  ?Patient can return home with the following ? Help with stairs or ramp for  entrance ? ?  ?Equipment Recommendations None recommended by PT  ?Recommendations for Other Services ?    ?  ?Functional Status Assessment Patient has had a recent decline in their functional status and demonstrates the ability to make significant improvements in function in a reasonable and predictable amount of time.  ? ?  ?Precautions / Restrictions Precautions ?Precautions: Fall ?Restrictions ?Weight Bearing Restrictions: Yes ?RLE Weight Bearing: Partial weight bearing ?LLE Weight Bearing: Partial weight bearing  ? ?  ? ?Mobility ? Bed Mobility ?Overal bed mobility: Independent ?  ?  ?  ?  ?  ?  ?  ?  ? ?Transfers ?Overall transfer level: Modified independent ?Equipment used: None ?  ?  ?  ?  ?  ?  ?  ?General transfer comment: increased time ?  ? ?Ambulation/Gait ?Ambulation/Gait assistance: Modified independent (Device/Increase time) ?Gait Distance (Feet): 320 Feet ?Assistive device: None ?Gait Pattern/deviations: WFL(Within Functional Limits) ?  ?Gait velocity interpretation: >2.62 ft/sec, indicative of community ambulatory ?  ?General Gait Details: B post op shoes worn for ambulation, patient maintained WBing precautions throughout ambulation ? ?Stairs ?Stairs: Yes ?Stairs assistance: Supervision ?Stair Management: One rail Right, Step to pattern, Alternating pattern ?Number of Stairs: 4 ?General stair comments: Ascended with a step to pattern, descended with a step through pattern, tactile cueing from [facility] but progressed to no hand rails when descending stairs ? ?Wheelchair Mobility ?  ? ?Modified Rankin (Stroke Patients Only) ?  ? ?  ? ?Balance Overall balance assessment: Modified Independent ?  ?  ?  ?  ?  ?  ?  ?  ?  ?  ?  ?  ?  ?  ?  ?  ?  ?  ?   ? ? ? ?  Pertinent Vitals/Pain Pain Assessment ?Pain Assessment: 0-10 ?Pain Score: 1  ?Pain Descriptors / Indicators: Dull ?Pain Intervention(s): Monitored during session, Repositioned  ? ? ?Home Living Family/patient expects to be discharged to:: Private  residence ?Living Arrangements: Spouse/significant other ?Available Help at Discharge: Family;Available 24 hours/day ?Type of Home: House ?Home Access: Stairs to enter ?Entrance Stairs-Rails: None ?Entrance Stairs-Number of Steps: 2 small steps ?  ?Home Layout: Two level;Able to live on main level with bedroom/bathroom ?  ?Additional Comments: knee scooter  ?  ?Prior Function Prior Level of Function : Independent/Modified Independent;Working/employed;Driving ?  ?  ?  ?  ?  ?  ?Mobility Comments: No AD for community ambulation ?ADLs Comments: Ind ADL's, splits IADL's with spouse ?  ? ? ?Hand Dominance  ? Dominant Hand: Right ? ?  ?Extremity/Trunk Assessment  ? Upper Extremity Assessment ?Upper Extremity Assessment: Overall WFL for tasks assessed ?  ? ?Lower Extremity Assessment ?Lower Extremity Assessment: Overall WFL for tasks assessed ?  ? ?   ?Communication  ? Communication: No difficulties  ?Cognition Arousal/Alertness: Awake/alert ?Behavior During Therapy: [facility] for tasks assessed/performed ?Overall Cognitive Status: Within Functional Limits for tasks assessed ?  ?  ?  ?  ?  ?  ?  ?  ?  ?  ?  ?  ?  ?  ?  ?  ?General Comments: A&Ox3- self location situation ?  ?  ? ?  ?General Comments General comments (skin integrity, edema, etc.): B post op shoes worn, SpO2 remained >90% throughout session, HR: 56-68bpm ? ?  ?Exercises Other Exercises ?Other Exercises: patient educated on role of PT in acute care setting, WBing precautions, fall risk, importance of post op shoes, and d/c recommendations  ? ?Assessment/Plan  ?  ?PT Assessment Patient does not need any further PT services  ?PT Problem List   ? ?   ?  ?PT Treatment Interventions     ? ?PT Goals (Current goals can be found in the Care Plan section)  ?Acute Rehab PT Goals ?Patient Stated Goal: to get better ?PT Goal Formulation: With patient ?Time For Goal Achievement: 11/06/21 ?Potential to Achieve Goals: Good ? ?  ?Frequency   ?  ? ? ?Co-evaluation   ?  ?  ?  ?   ? ? ?  ?AM-PAC PT "6 Clicks" Mobility  ?Outcome Measure Help needed turning from your back to your side while in a flat bed without using bedrails?: None ?Help needed moving from lying on your back to sitting on the side of a flat bed without using bedrails?: None ?Help needed moving to and from a bed to a chair (including a wheelchair)?: None ?Help needed standing up from a chair using your arms (e.g., wheelchair or bedside chair)?: None ?Help needed to walk in hospital room?: None ?Help needed climbing 3-5 steps with a railing? : None ?6 Click Score: 24 ? ?  ?End of Session Equipment Utilized During Treatment: Gait belt ?Activity Tolerance: Patient tolerated treatment well ?Patient left: in bed;with call bell/phone within reach;with bed alarm set ?Nurse Communication: Mobility status ?PT Visit Diagnosis: Unsteadiness on feet (R26.81) ?  ? ?Time: 0955-1010 ?PT Time Calculation (min) (ACUTE ONLY): 15 min ? ? ?Charges:   PT Evaluation ?$PT Eval Low Complexity: 1 Low ?  ?  ?   ? ?Angelica Ran, PT  ?10/23/21. 10:23 AM ? ? ?

## 2021-10-23 NOTE — Progress Notes (Signed)
PODIATRY / FOOT AND ANKLE SURGERY PROGRESS NOTE ? ?Requesting Physician: Dr Allena Katz ? ?Reason for consult: Right foot infection ? ?Chief Complaint: Right foot wound/infection ?  ?HPI: Alexander Gill is a 66 y.o. male who presents today resting in chair next to bedside comfortably.  Patient is not complaining of any pain or discomfort.  Patient has a day out from undergoing a right partial third ray amputation, left hallux IPJ wound debridement, left second toe incision and drainage with arthroplasty of second toe and extensor tendon lengthening.  Patient is kept his dressings clean, dry, and intact and has been working with physical therapy and is trying to walk on his heel on the right side. ? ?PMHx:  ?Past Medical History:  ?Diagnosis Date  ? Cataract   ? bilateral eyes  ? Diabetes mellitus without complication (HCC)   ? 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 Hypertension   ? ? ?Surgical Hx:  ?Past Surgical History:  ?Procedure Laterality Date  ? AMPUTATION Right 10/22/2021  ? Procedure: AMPUTATION RAY-PARTIAL 3RD RAY;  Surgeon: Rosetta Posner, DPM;  Location: [facility] ORS;  Service: Podiatry;  Laterality: Right;  ? AMPUTATION TOE Right 07/05/2021  ? Procedure: AMPUTATION RIGHT 4TH TOE;  Surgeon: Linus Galas, DPM;  Location: [facility] ORS;  Service: Podiatry;  Laterality: Right;  ? HERNIA REPAIR    ? INCISION AND DRAINAGE OF WOUND Left 10/22/2021  ? Procedure: IRRIGATION AND DEBRIDEMENT WOUND;  Surgeon: Rosetta Posner, DPM;  Location: [facility] ORS;  Service: Podiatry;  Laterality: Left;  ? TOE ARTHROPLASTY Left 10/22/2021  ? Procedure: TOE ARTHROPLASTY;  Surgeon: Rosetta Posner, DPM;  Location: [facility] ORS;  Service: Podiatry;  Laterality: Left;  ? ? ?FHx:  ?Family History  ?Problem Relation Age of Onset  ? Diabetes Mother   ? Diabetes Father   ? Parkinson's disease Brother   ? ? ?Social History:  reports that he has never smoked. He has never used smokeless tobacco. He reports that he does not currently use alcohol. He reports that  he does not currently use drugs. ? ?Allergies: No Known Allergies ? ?Medications Prior to Admission  ?Medication Sig Dispense Refill  ? aspirin EC 81 MG tablet Take 81 mg by mouth daily. Swallow whole.    ? atenolol (TENORMIN) 25 MG tablet Take 25 mg by mouth daily.    ? cholecalciferol (VITAMIN D3) 25 MCG (1000 UNIT) tablet Take 1,000 Units by mouth daily.    ? Dapagliflozin-metFORMIN HCl ER (XIGDUO XR) 11-998 MG TB24 Take 1 tablet by mouth daily at 2 am.    ? Insulin Glargine (BASAGLAR KWIKPEN [place]) Inject 36 Units into the skin daily.    ? levothyroxine (SYNTHROID) 25 MCG tablet Take 25 mcg by mouth daily.    ? rosuvastatin (CRESTOR) 20 MG tablet Take 20 mg by mouth daily.    ? ? ?Physical Exam: ?General: Alert and oriented.  No apparent distress. ? ?Vascular: DP/PT pulses palpable bilateral, capillary fill time intact to digits bilaterally. ? ?Neuro: Light touch sensation absent to bilateral lower extremities. ? ?Derm: Incision site to the right third ray appears to be well coapted with sutures intact, no dehiscence, mild serous drainage, mild edema and erythema, erythema does appear to be improving compared to preoperative state. ? ?Left second toe PIPJ incision appears to be well coapted with sutures intact, mild edema, no erythema, no drainage, no odor. ? ?Left hallux IPJ wound appears to be stable at this time with no acute signs of infection.  No bone exposed. ? ?MSK: Left second hammertoe.  Left hallux valgus with hallux malleus. ? ?Results for orders placed or performed during the hospital encounter of 10/21/21 (from the past 48 hour(s))  ?Blood culture (routine x 2)     Status: None (Preliminary result)  ? Collection Time: 10/21/21  5:40 PM  ? Specimen: BLOOD  ?Result Value Ref Range  ? Specimen Description BLOOD LEFT FOREARM   ? Special Requests    ?  BOTTLES DRAWN AEROBIC AND ANAEROBIC Blood Culture adequate volume  ? Culture    ?  NO GROWTH 2 DAYS ?Performed at [facility], [address] [place], [place] [address] ?  ? Report Status PENDING   ?Resp Panel by RT-PCR (Flu A&B, Covid) Nasopharyngeal Swab     Status: None  ? Collection Time: 10/21/21  6:35 PM  ? Specimen: Nasopharyngeal Swab; Nasopharyngeal(NP) swabs in vial transport medium  ?Result Value Ref Range  ? SARS Coronavirus 2 by RT PCR NEGATIVE NEGATIVE  ?  Comment: (NOTE) ?SARS-CoV-2 target nucleic acids are NOT DETECTED. ? ?The SARS-CoV-2 RNA is generally detectable in upper respiratory ?specimens during the acute phase of infection. The lowest ?concentration of SARS-CoV-2 viral copies this assay can detect is ?138 copies/mL. A negative result does not preclude SARS-Cov-2 ?infection and should not be used as the sole basis for treatment or ?other patient management decisions. A negative result may occur with  ?improper specimen collection/handling, submission of specimen other ?than nasopharyngeal swab, presence of viral mutation(s) within the ?areas targeted by this assay, and inadequate number of viral ?copies(<138 copies/mL). A negative result must be combined with ?clinical observations, patient history, and epidemiological ?information. The expected result is Negative. ? ?Fact Sheet for Patients:  ?BloggerCourse.com ? ?Fact Sheet for Healthcare Providers:  ?SeriousBroker.it ? ?This test is no t yet approved or cleared by the [place] FDA and  ?has been authorized for detection and/or diagnosis of SARS-CoV-2 by ?FDA under an Emergency Use Authorization (EUA). This EUA will remain  ?in effect (meaning this test can be used) for the duration of the ?COVID-19 declaration under Section 564(b)(1) of the Act, 21 ?U.S.C.section 360bbb-3(b)(1), unless the authorization is terminated  ?or revoked sooner.  ? ? ?  ? Influenza A by PCR NEGATIVE NEGATIVE  ? Influenza B by PCR NEGATIVE NEGATIVE  ?  Comment: (NOTE) ?The Xpert Xpress SARS-CoV-2/FLU/RSV plus assay is intended as an aid ?in the  diagnosis of influenza from Nasopharyngeal swab specimens and ?should not be used as a sole basis for treatment. Nasal washings and ?aspirates are unacceptable for Xpert Xpress SARS-CoV-2/FLU/RSV ?testing. ? ?Fact Sheet for Patients: ?BloggerCourse.com ? ?Fact Sheet for Healthcare Providers: ?SeriousBroker.it ? ?This test is not yet approved or cleared by the [place] FDA and ?has been authorized for detection and/or diagnosis of SARS-CoV-2 by ?FDA under an Emergency Use Authorization (EUA). This EUA will remain ?in effect (meaning this test can be used) for the duration of the ?COVID-19 declaration under Section 564(b)(1) of the Act, 21 U.S.C. ?section 360bbb-3(b)(1), unless the authorization is terminated or ?revoked. ? ?Performed at [facility], [address] [place], ?[place] [address] ?  ?Lactic acid, plasma     Status: Abnormal  ? Collection Time: 10/21/21  7:06 PM  ?Result Value Ref Range  ? Lactic Acid, Venous 3.6 (HH) 0.5 - 1.9 mmol/L  ?  Comment: CRITICAL RESULT CALLED TO, READ BACK BY AND VERIFIED WITH ?KELSEY PETERS 10/21/21 2013 MU ?Performed at [facility]  [facility], [address] [place], [place] [address] ?  ?Hemoglobin A1c     Status: Abnormal  ? Collection Time: 10/21/21  7:06 PM  ?Result Value Ref Range  ? Hgb A1c MFr Bld 8.3 (H) 4.8 - 5.6 %  ?  Comment: (NOTE) ?        Prediabetes: 5.7 - 6.4 ?        Diabetes: >6.4 ?        Glycemic control for adults with diabetes: <7.0 ?  ? Mean Plasma Glucose 192 mg/dL  ?  Comment: (NOTE) ?Performed At: [facility] ?[address] [place], [place] [id number] ?Jolene SchimkeNagendra Sanjai MD OZ:[phone] ?  ?Glucose, capillary     Status: Abnormal  ? Collection Time: 10/21/21  8:31 PM  ?Result Value Ref Range  ? Glucose-Capillary 117 (H) 70 - 99 mg/dL  ?  Comment: Glucose reference range applies only to samples taken after fasting for at least 8 hours.  ?Glucose, capillary     Status: Abnormal  ?  Collection Time: 10/22/21 12:04 AM  ?Result Value Ref Range  ? Glucose-Capillary 132 (H) 70 - 99 mg/dL  ?  Comment: Glucose reference range applies only to samples taken after fasting for at least 8 hours.  ?

## 2021-10-23 NOTE — Progress Notes (Signed)
? ?Date of Admission:  10/21/2021    ?ID: Alexander Gill is a 66 y.o. male  ?Principal Problem: ?  Cellulitis ?Active Problems: ?  Pyogenic inflammation of bone (HCC) ?  Diabetic foot infection (HCC) ? ? ? ?Subjective: ?Doing well ?No pain ? ? ?Medications:  ? atenolol  25 mg Oral Daily  ? Chlorhexidine Gluconate Cloth  6 each Topical Q0600  ? cholecalciferol  1,000 Units Oral Daily  ? enoxaparin (LOVENOX) injection  40 mg Subcutaneous QHS  ? insulin aspart  0-15 Units Subcutaneous Q4H  ? levothyroxine  25 mcg Oral Daily  ? multivitamin with minerals  1 tablet Oral Daily  ? mupirocin ointment  1 application. Nasal BID  ? rosuvastatin  20 mg Oral Daily  ? ? ?Objective: ?Vital signs in last 24 hours: ?Temp:  [97.5 ?F (36.4 ?C)-98.6 ?F (37 ?C)] 98.2 ?F (36.8 ?C) (04/06 1127) ?Pulse Rate:  [49-58] 51 (04/06 1127) ?Resp:  [12-18] 18 (04/06 1127) ?BP: (97-155)/(60-85) 120/66 (04/06 1127) ?SpO2:  [95 %-99 %] 99 % (04/06 1127) ?Weight:  [86.2 kg] 86.2 kg (04/05 1710) ? ?PHYSICAL EXAM:  ?General: Alert, cooperative, no distress, appears stated age.  ?Head: Normocephalic, without obvious abnormality, atraumatic. ?Eyes: Conjunctivae clear, anicteric sclerae. Pupils are equal ?ENT Nares normal. No drainage or sinus tenderness. ?Lips, mucosa, and tongue normal. No Thrush ?Neck: Supple, symmetrical, no adenopathy, thyroid: non tender ?no carotid bruit and no JVD. ?Back: No CVA tenderness. ?Lungs: Clear to auscultation bilaterally. No Wheezing or Rhonchi. No rales. ?Heart: Regular rate and rhythm, no murmur, rub or gallop. ?Abdomen: Soft, non-tender,not distended. Bowel sounds normal. No masses ?Extremities: Right foot surgical dressing not removed ? ? ? ? ? ? ?Skin: No rashes or lesions. Or bruising ?Lymph: Cervical, supraclavicular normal. ?Neurologic: Grossly non-focal ? ?Lab Results ?Recent Labs  ?  10/21/21 ?1217 10/22/21 ?[id number]  ?WBC 13.4*  --   ?HGB 13.9  --   ?HCT 42.8  --   ?NA 139  --   ?K 3.6  --   ?CL 103  --   ?CO2 24  --    ?BUN 19  --   ?CREATININE 0.93 0.78  ? ?Liver Panel ?Recent Labs  ?  10/21/21 ?1217  ?PROT 8.6*  ?ALBUMIN 3.6  ?AST 19  ?ALT 17  ?ALKPHOS 64  ?BILITOT 0.6  ? ? ?Microbiology: ?Pseudomonas, MSSA and haemophilus parainfluenza culture in the bedside wound.  Surgical culture still pending ?Studies/Results: ?MR FOOT RIGHT W WO CONTRAST ? ?Result Date: 10/22/2021 ?CLINICAL DATA:  Worsening infection of the right toes, diabetes EXAM: MRI OF THE RIGHT FOREFOOT WITHOUT AND WITH CONTRAST TECHNIQUE: Multiplanar, multisequence MR imaging of the Multiple exams, including radiographs 10/21/2021 was performed before and after the administration of intravenous contrast. CONTRAST:  8mL GADAVIST GADOBUTROL 1 MMOL/ML IV SOLN COMPARISON:  None. FINDINGS: Despite efforts by the technologist and patient, motion artifact is present on today's exam and could not be eliminated. This reduces exam sensitivity and specificity. Bones/Joint/Cartilage Prior amputation, fourth toe. Subtle enhancement in the head of the fourth metatarsal adjacent to hypoenhancing plantar tissues of the ball of foot, probably reactive. Fused interphalangeal joint of the great toe. Bony destructive findings of the distal phalanx third toe and middle phalanx third toe with septic and fluid distended proximal interphalangeal joint with marginal synovial enhancement. A track of fluid from the proximal interphalangeal joint appears to extend distally along the middle phalanx and exit to the cutaneous surface of the toe as shown on images 9  through 12 of series 13. Subtle enhancement in the head of the third metatarsal may be reactive or due to early osteomyelitis. No osteomyelitis of the second or fifth toe identified. No metatarsal involvement or first digit sesamoid involvement. Ligaments Lisfranc ligament intact. Muscles and Tendons Generalized edema tracking along regional musculature, neurogenic versus myositis. Soft tissues Extensive subcutaneous edema and  enhancement in the forefoot especially distally and extending into the third toe. Along the ball of the foot and partially extending into the small toe, a 3.8 by 2.0 by 6.1 cm region of hypoenhancing tissue may represent necrotic or nonviable soft tissues, although there is no gas in this tissue currently. IMPRESSION: 1. Extensive osteomyelitis, third toe as detailed above with sinus tract from the proximal interphalangeal joint extending distally into the toe and exiting to the cutaneous surface. Subtle enhancement in the head of the third metatarsal may be reactive or due to early osteomyelitis. 2. Extensive subcutaneous edema and enhancement in the forefoot favoring cellulitis. Edema and enhancement in regional musculature could be neurogenic or from myositis. 3. Substantial region of hypoenhancement in the soft tissues of the ball of the foot may represent necrotic or nonviable soft tissues, although there is no gas infiltrating is tissue currently. 4. Prior fourth toe amputation. Electronically Signed   By: Gaylyn Rong M.D.   On: 10/22/2021 06:28  ? ?DG Foot 2 Views Left ? ?Result Date: 10/22/2021 ?CLINICAL DATA:  Left foot partial amputation EXAM: LEFT FOOT - 2 VIEW COMPARISON:  10/21/2021 FINDINGS: Since the prior examination, there has been partial resection of the proximal aspect of the second middle phalanx. Remote resection of the distal aspect of the second proximal phalanx again noted. Advanced erosive changes noted involving the interphalangeal joint of the great toe with marked lateral angulation. Posttraumatic or postsurgical changes involving the distal phalanx of the third toe again noted. Stable erosive changes involving the second metatarsal head. No acute fracture or dislocation. Vascular calcifications noted. IMPRESSION: Interval partial resection of the proximal aspect of the second middle phalanx. Otherwise stable appearance of the left foot when compared to prior examination of  10/21/2021. Electronically Signed   By: Helyn Numbers M.D.   On: 10/22/2021 20:47  ? ?DG Foot 2 Views Right ? ?Result Date: 10/22/2021 ?CLINICAL DATA:  Right foot amputation EXAM: RIGHT FOOT - 2 VIEW COMPARISON:  None. FINDINGS: Two view radiograph right foot demonstrates resection of the fourth toe distal to the MTP joint and interval transmetatarsal resection of the third toe with partial resection of the third metatarsal head. Stable erosive changes involving the lateral aspect of the fifth metatarsal head. Subcutaneous gas and soft tissue swelling is seen of the right forefoot in keeping with recent surgical resection. Arthrodesis of the interphalangeal joint of the great toe is again noted. No acute fracture or dislocation. IMPRESSION: Interval transmetatarsal resection of the right third digit. Otherwise unchanged appearance when compared to prior examination of 10/21/2021. Electronically Signed   By: Helyn Numbers M.D.   On: 10/22/2021 20:45   ? ? ?Assessment/Plan: ?Diabetic foot infection with neuropathy/ ?Right third toe and metatarsal phalangeal joint osteomyelitis with abscess.  Left second toe PIPJ ulceration with bone exposed ?Left great toe medial subcutaneous ulceration ?Underwent partial third ray amputation on the right foot ?Underwent left second PIP joint arthroplasty with resection of bone and excision of wound and closure and extensor tendon release ?Left IPJ hallux wound debridement ?Surgical wound cultures are still pending ?Bedside culture from the right foot had  staph, Pseudomonas and haemophilus parainfluenza.  Susceptibility pending ?We could treat him with oral antibiotics on discharge.  Very likely he would go on ciprofloxacin and Augmentin. ?Await surgical culture before deciding on discharge antibiotics ?Continue Vanco and cefepime. ? ?History of recurrent infections toes bilaterally. 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 ?Hypothyroidism on low-dose Synthroid ? ? ?HLD  on rosuvastatin ? ?Discussed the management with the patient and care team ?  ?

## 2021-10-24 DIAGNOSIS — L089 Local infection of the skin and subcutaneous tissue, unspecified: Secondary | ICD-10-CM | POA: Diagnosis not present

## 2021-10-24 DIAGNOSIS — E11628 Type 2 diabetes mellitus with other skin complications: Secondary | ICD-10-CM | POA: Diagnosis not present

## 2021-10-24 DIAGNOSIS — M86171 Other acute osteomyelitis, right ankle and foot: Secondary | ICD-10-CM | POA: Diagnosis not present

## 2021-10-24 DIAGNOSIS — L03115 Cellulitis of right lower limb: Secondary | ICD-10-CM | POA: Diagnosis not present

## 2021-10-24 LAB — SURGICAL PATHOLOGY

## 2021-10-24 LAB — AEROBIC CULTURE W GRAM STAIN (SUPERFICIAL SPECIMEN)

## 2021-10-24 LAB — GLUCOSE, CAPILLARY
Glucose-Capillary: 118 mg/dL — ABNORMAL HIGH (ref 70–99)
Glucose-Capillary: 159 mg/dL — ABNORMAL HIGH (ref 70–99)

## 2021-10-24 MED ORDER — AMOXICILLIN-POT CLAVULANATE 875-125 MG PO TABS: 1.0000 | ORAL_TABLET | Freq: Two times a day (BID) | ORAL | 0 refills | Status: DC

## 2021-10-24 MED ORDER — CIPROFLOXACIN HCL 500 MG PO TABS: 500.0000 mg | ORAL_TABLET | Freq: Two times a day (BID) | ORAL | 0 refills | Status: DC

## 2021-10-24 NOTE — Discharge Instructions (Signed)
Leave current for dressing till seen by Dr. Luana Shu next week ?

## 2021-10-24 NOTE — Plan of Care (Signed)
?  Problem: Clinical Measurements: ?Goal: Ability to maintain clinical measurements within normal limits will improve ?Outcome: Progressing ?Goal: Will remain free from infection ?Outcome: Progressing ?Goal: Diagnostic test results will improve ?Outcome: Progressing ?  ?Problem: Pain Managment: ?Goal: General experience of comfort will improve ?Outcome: Progressing ?  ?Problem: Safety: ?Goal: Ability to remain free from injury will improve ?Outcome: Progressing ?  ?Problem: Skin Integrity: ?Goal: Risk for impaired skin integrity will decrease ?Outcome: Progressing ?  ?Problem: Clinical Measurements: ?Goal: Ability to avoid or minimize complications of infection will improve ?Outcome: Progressing ?  ?Problem: Skin Integrity: ?Goal: Skin integrity will improve ?Outcome: Progressing ?  ?

## 2021-10-24 NOTE — Plan of Care (Signed)
?  Problem: Clinical Measurements: ?Goal: Ability to maintain clinical measurements within normal limits will improve ?10/24/2021 1042 by Jean Rosenthal, RN ?Outcome: Adequate for Discharge ?10/24/2021 0733 by Jean Rosenthal, RN ?Outcome: Progressing ?Goal: Will remain free from infection ?10/24/2021 1042 by Jean Rosenthal, RN ?Outcome: Adequate for Discharge ?10/24/2021 0733 by Jean Rosenthal, RN ?Outcome: Progressing ?Goal: Diagnostic test results will improve ?10/24/2021 1042 by Jean Rosenthal, RN ?Outcome: Adequate for Discharge ?10/24/2021 0733 by Jean Rosenthal, RN ?Outcome: Progressing ?  ?Problem: Pain Managment: ?Goal: General experience of comfort will improve ?10/24/2021 1042 by Jean Rosenthal, RN ?Outcome: Adequate for Discharge ?10/24/2021 0733 by Jean Rosenthal, RN ?Outcome: Progressing ?  ?Problem: Safety: ?Goal: Ability to remain free from injury will improve ?10/24/2021 1042 by Jean Rosenthal, RN ?Outcome: Adequate for Discharge ?10/24/2021 0733 by Jean Rosenthal, RN ?Outcome: Progressing ?  ?Problem: Skin Integrity: ?Goal: Skin integrity will improve ?10/24/2021 1042 by Jean Rosenthal, RN ?Outcome: Adequate for Discharge ?10/24/2021 0733 by Jean Rosenthal, RN ?Outcome: Progressing ?  ?

## 2021-10-24 NOTE — Discharge Summary (Signed)
?Physician Discharge Summary ?  ?Patient: Alexander Gill MRN: [id number] DOB: [birth year]  ?Admit date:     10/21/2021  ?Discharge date: 10/24/21  ?Discharge Physician: Fritzi Mandes  ? ?PCP: Azucena Cecil, MD  ? ?Recommendations at discharge:  ? ?follow-up podiatry Dr. Luana Shu next week ?follow-up infectious disease Dr. Steva Ready two weeks ? ?Discharge Diagnoses: ?Right third toe osteomyelitis secondary to diabetic foot ulceration associated with cellulitis status post partial third ray amputation ?Diabetes type II polyneuropathy ?Chronic stable osteomyelitis to the left hillocks and second toe with associated ulceration-- status post wound debridement left hallux and PIP joint arthroplasty with extensor tendon tendon lengthening ? ? ?Hospital Course: ? ?Alexander Gill is a 66 y.o. male  with medical history significant of hypertension, hyperlipidemia, diabetes mellitus, hypothyroidism was sent from his podiatrist office for right third foot infection.  He had presented to his office with pain, worsening drainage swelling and redness. ?  ?X-ray of the left foot:  ?New periarticular bone destruction at the PIP joint of the second ?digit and DIP joint of the third digit concerning for ?osteomyelitis/septic arthritis. ?Sequelae of great toe IP joint septic arthritis with periarticular ?lucency and sclerosis consistent with chronic osteomyelitis. ?  ? X-ray of right foot:  ?Soft tissue swelling of the third toe with bony destruction of the ?middle and distal phalanges, concerning for osteomyelitis/pathologic ?fracture.Prior fourth toe amputation. Mild lucency of the fourth metatarsal head. ?  ?Right lower extremity cellulitis ?right second and third toe osteomyelitis/septic arthritis ?-- currently on broad-spectrum antibiotic ?-- podiatry consultation with Dr. Luana Shu-  ?s/p Right partial third ray amputation ?Left second PIPJ arthroplasty with resection of bone and excision of wound with closure and extensor tendon release ?Left  IPJ hallux wound debridement ?-- infectious disease consultation help with antibiotic-- recommends Augmentin and Cipro for four weeks ?-- blood cultures negative ?--Deep wound culture-- colonies too small to read ?-- okay from podiatry and ID standpoint for discharge. Patient will follow them respectively. ?  ?type II diabetes with hyperglycemia, diabetic neuropathy ?-- continue sliding scale ?-- will resume home meds  ?  ?Hypothyroidism ?-- continue Synthroid ?  ?Hypertension ?-- continue statin ?  ? ?  ? ? ?Consultants: disease, podiatry Dr. Luana Shu ?Procedures performed: as above ?Disposition: Home ?Diet recommendation:  ?Discharge Diet Orders (From admission, onward)  ? ?  Start     Ordered  ? 10/24/21 0000  Diet - low sodium heart healthy       ? 10/24/21 1043  ? ?  ?  ? ?  ? ?Cardiac and Carb modified diet ?DISCHARGE MEDICATION: ?Allergies as of 10/24/2021   ?No Known Allergies ?  ? ?  ?Medication List  ?  ? ?TAKE these medications   ? ?amoxicillin-clavulanate 875-125 MG tablet ?Commonly known as: Augmentin ?Take 1 tablet by mouth 2 (two) times daily for 28 days. ?  ?aspirin EC 81 MG tablet ?Take 81 mg by mouth daily. Swallow whole. ?  ?atenolol 25 MG tablet ?Commonly known as: TENORMIN ?Take 25 mg by mouth daily. ?  [name] ?Inject 36 Units into the skin daily. ?  ?cholecalciferol 25 MCG (1000 UNIT) tablet ?Commonly known as: VITAMIN D3 ?Take 1,000 Units by mouth daily. ?  ?ciprofloxacin 500 MG tablet ?Commonly known as: Cipro ?Take 1 tablet (500 mg total) by mouth 2 (two) times daily for 28 days. ?  ?levothyroxine 25 MCG tablet ?Commonly known as: SYNTHROID ?Take 25 mcg by mouth daily. ?  ?rosuvastatin 20 MG tablet ?Commonly known as:  CRESTOR ?Take 20 mg by mouth daily. ?  ?Xigduo XR 11-998 MG Tb24 ?Generic drug: Dapagliflozin-metFORMIN HCl ER ?Take 1 tablet by mouth daily at 2 am. ?  ? ?  ? ?  ?  ? ? ?  ?Discharge Care Instructions  ?(From admission, onward)  ?  ? ? ?  ? ?  Start     Ordered  ?  10/24/21 0000  Discharge wound care:       ?Comments: Patient to leave current dressing on till he sees Dr. Luana Shu  ? 10/24/21 1043  ? ?  ?  ? ?  ? ? Follow-up Information   ? ? Azucena Cecil, MD. Call in 1 week(s).   ?Specialty: Family Medicine ?Why: hosp f/u ?Contact information: ?[address] ?[place] [address] ?[phone] ? ? ?  ?  ? ? Caroline More, DPM. Go in 1 week(s).   ?Specialty: Podiatry ?Why: pt to call and make appt ?Contact information: ?[address] ?[place] [address] ?[phone] ? ? ?  ?  ? ? Tsosie Billing, MD. Go in 2 week(s).   ?Specialty: Infectious Diseases ?Contact information: ?[place] [address] ?[phone] ? ? ?  ?  ? ?  ?  ? ?  ? ?Discharge Exam: ?Filed Weights  ? 10/21/21 1618 10/22/21 1710  ?Weight: 88.5 kg 86.2 kg  ? ? ? ? ? ? ?Condition at discharge: fair ? ?The results of significant diagnostics from this hospitalization (including imaging, microbiology, ancillary and laboratory) are listed below for reference.  ? ?Imaging Studies: ?MR FOOT RIGHT W WO CONTRAST ? ?Result Date: 10/22/2021 ?CLINICAL DATA:  Worsening infection of the right toes, diabetes EXAM: MRI OF THE RIGHT FOREFOOT WITHOUT AND WITH CONTRAST TECHNIQUE: Multiplanar, multisequence MR imaging of the Multiple exams, including radiographs 10/21/2021 was performed before and after the administration of intravenous contrast. CONTRAST:  23mL GADAVIST GADOBUTROL 1 MMOL/ML IV SOLN COMPARISON:  None. FINDINGS: Despite efforts by the technologist and patient, motion artifact is present on today's exam and could not be eliminated. This reduces exam sensitivity and specificity. Bones/Joint/Cartilage Prior amputation, fourth toe. Subtle enhancement in the head of the fourth metatarsal adjacent to hypoenhancing plantar tissues of the ball of foot, probably reactive. Fused interphalangeal joint of the great toe. Bony destructive findings of the distal phalanx third toe and  middle phalanx third toe with septic and fluid distended proximal interphalangeal joint with marginal synovial enhancement. A track of fluid from the proximal interphalangeal joint appears to extend distally along the middle phalanx and exit to the cutaneous surface of the toe as shown on images 9 through 12 of series 13. Subtle enhancement in the head of the third metatarsal may be reactive or due to early osteomyelitis. No osteomyelitis of the second or fifth toe identified. No metatarsal involvement or first digit sesamoid involvement. Ligaments Lisfranc ligament intact. Muscles and Tendons Generalized edema tracking along regional musculature, neurogenic versus myositis. Soft tissues Extensive subcutaneous edema and enhancement in the forefoot especially distally and extending into the third toe. Along the ball of the foot and partially extending into the small toe, a 3.8 by 2.0 by 6.1 cm region of hypoenhancing tissue may represent necrotic or nonviable soft tissues, although there is no gas in this tissue currently. IMPRESSION: 1. Extensive osteomyelitis, third toe as detailed above with sinus tract from the proximal interphalangeal joint extending distally into the toe and exiting to the cutaneous surface. Subtle enhancement in the head of the third  metatarsal may be reactive or due to early osteomyelitis. 2. Extensive subcutaneous edema and enhancement in the forefoot favoring cellulitis. Edema and enhancement in regional musculature could be neurogenic or from myositis. 3. Substantial region of hypoenhancement in the soft tissues of the ball of the foot may represent necrotic or nonviable soft tissues, although there is no gas infiltrating is tissue currently. 4. Prior fourth toe amputation. Electronically Signed   By: Van Clines M.D.   On: 10/22/2021 06:28  ? ?DG Foot 2 Views Left ? ?Result Date: 10/22/2021 ?CLINICAL DATA:  Left foot partial amputation EXAM: LEFT FOOT - 2 VIEW COMPARISON:  10/21/2021  FINDINGS: Since the prior examination, there has been partial resection of the proximal aspect of the second middle phalanx. Remote resection of the distal aspect of the second proximal phalanx again noted.

## 2021-10-26 LAB — CULTURE, BLOOD (ROUTINE X 2)
Culture: NO GROWTH
Culture: NO GROWTH
Special Requests: ADEQUATE

## 2021-10-27 LAB — AEROBIC/ANAEROBIC CULTURE W GRAM STAIN (SURGICAL/DEEP WOUND)
Gram Stain: NONE SEEN
Gram Stain: NONE SEEN

## 2021-11-13 ENCOUNTER — Ambulatory Visit: Payer: BC Managed Care – PPO | Attending: Infectious Diseases | Admitting: Infectious Diseases

## 2021-11-13 ENCOUNTER — Other Ambulatory Visit
Admission: RE | Admit: 2021-11-13 | Discharge: 2021-11-13 | Disposition: A | Discharge status: Home / Self Care | Payer: BC Managed Care – PPO | Source: Ambulatory Visit | Attending: Infectious Diseases | Admitting: Infectious Diseases

## 2021-11-13 ENCOUNTER — Encounter: Payer: Self-pay | Admitting: Infectious Diseases

## 2021-11-13 VITALS — BP 147/82 | HR 53 | Temp 97.5°F | Wt 183.0 lb

## 2021-11-13 DIAGNOSIS — E1142 Type 2 diabetes mellitus with diabetic polyneuropathy: Secondary | ICD-10-CM | POA: Diagnosis not present

## 2021-11-13 DIAGNOSIS — E785 Hyperlipidemia, unspecified: Secondary | ICD-10-CM | POA: Insufficient documentation

## 2021-11-13 DIAGNOSIS — Z79890 Hormone replacement therapy (postmenopausal): Secondary | ICD-10-CM | POA: Insufficient documentation

## 2021-11-13 DIAGNOSIS — I10 Essential (primary) hypertension: Secondary | ICD-10-CM | POA: Insufficient documentation

## 2021-11-13 DIAGNOSIS — E11628 Type 2 diabetes mellitus with other skin complications: Secondary | ICD-10-CM | POA: Diagnosis present

## 2021-11-13 DIAGNOSIS — L089 Local infection of the skin and subcutaneous tissue, unspecified: Secondary | ICD-10-CM | POA: Diagnosis present

## 2021-11-13 DIAGNOSIS — Z79899 Other long term (current) drug therapy: Secondary | ICD-10-CM | POA: Diagnosis not present

## 2021-11-13 DIAGNOSIS — Z7984 Long term (current) use of oral hypoglycemic drugs: Secondary | ICD-10-CM | POA: Insufficient documentation

## 2021-11-13 DIAGNOSIS — E039 Hypothyroidism, unspecified: Secondary | ICD-10-CM | POA: Insufficient documentation

## 2021-11-13 DIAGNOSIS — Z794 Long term (current) use of insulin: Secondary | ICD-10-CM | POA: Diagnosis not present

## 2021-11-13 LAB — CBC WITH DIFFERENTIAL/PLATELET
Abs Immature Granulocytes: 0.02 10*3/uL (ref 0.00–0.07)
Basophils Absolute: 0.1 10*3/uL (ref 0.0–0.1)
Basophils Relative: 1 %
Eosinophils Absolute: 0.4 10*3/uL (ref 0.0–0.5)
Eosinophils Relative: 5 %
HCT: 46.2 % (ref 39.0–52.0)
Hemoglobin: 15 g/dL (ref 13.0–17.0)
Immature Granulocytes: 0 %
Lymphocytes Relative: 23 %
Lymphs Abs: 1.8 10*3/uL (ref 0.7–4.0)
MCH: 27.9 pg (ref 26.0–34.0)
MCHC: 32.5 g/dL (ref 30.0–36.0)
MCV: 86 fL (ref 80.0–100.0)
Monocytes Absolute: 0.7 10*3/uL (ref 0.1–1.0)
Monocytes Relative: 8 %
Neutro Abs: 5 10*3/uL (ref 1.7–7.7)
Neutrophils Relative %: 63 %
Platelets: 207 10*3/uL (ref 150–400)
RBC: 5.37 MIL/uL (ref 4.22–5.81)
RDW: 14.1 % (ref 11.5–15.5)
WBC: 8 10*3/uL (ref 4.0–10.5)
nRBC: 0 % (ref 0.0–0.2)

## 2021-11-13 LAB — COMPREHENSIVE METABOLIC PANEL
ALT: 31 U/L (ref 0–44)
AST: 29 U/L (ref 15–41)
Albumin: 4.2 g/dL (ref 3.5–5.0)
Alkaline Phosphatase: 68 U/L (ref 38–126)
Anion gap: 8 (ref 5–15)
BUN: 18 mg/dL (ref 8–23)
CO2: 28 mmol/L (ref 22–32)
Calcium: 9.3 mg/dL (ref 8.9–10.3)
Chloride: 101 mmol/L (ref 98–111)
Creatinine, Ser: 0.72 mg/dL (ref 0.61–1.24)
GFR, Estimated: >60 mL/min (ref >60)
Glucose, Bld: 217 mg/dL — ABNORMAL HIGH (ref 70–99)
Potassium: 3.9 mmol/L (ref 3.5–5.1)
Sodium: 137 mmol/L (ref 135–145)
Total Bilirubin: 0.5 mg/dL (ref 0.3–1.2)
Total Protein: 8.2 g/dL — ABNORMAL HIGH (ref 6.5–8.1)

## 2021-11-13 LAB — C-REACTIVE PROTEIN: CRP: 0.5 mg/dL (ref <1.0)

## 2021-11-13 LAB — SEDIMENTATION RATE: Sed Rate: 16 mm/hr (ref 0–20)

## 2021-11-13 NOTE — Patient Instructions (Signed)
You are here for follow up of rt and left toes infection- you are on cipro and augmentin which you will take for 4 weeks , may extend upto 6 weeks- today will do labs and decide ?

## 2021-11-13 NOTE — Progress Notes (Signed)
NAME: Alexander Gill  ?DOB: [birth year]  ?MRN: [id number]  ?Date/Time: 11/13/2021 9:44 AM ? ? ?Subjective:  ? ?? ?Alexander Gill is a 66 y.o. male with a history of DM, peripheral neuropathy, HTN was recently in hospital between 10/21/21-10/24/21 for rt foot infection and underwent 3rd toe  ray amputation and it was acute osteomyelitis. Had Left second PIPJ arthroplasty with resection of bone and excision of wound with closure and extensor tendon release. Pathology was acute on chronic osteo. Also had debridement of the left great toe callu ?The culture was MSSA, eikenella corrodens ?He was discharged home on cipro and augmentin for 4 weeks .  ?He has been doing very well ?Tolerating antibiotics- no side effects ?The wound has healed- he is following with podiatrist- off loading ? ? ?Past Medical History:  ?Diagnosis Date  ? Cataract   ? bilateral eyes  ? Diabetes mellitus without complication (HCC)   ? 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 Hypertension   ?  ?Past Surgical History:  ?Procedure Laterality Date  ? AMPUTATION Right 10/22/2021  ? Procedure: AMPUTATION RAY-PARTIAL 3RD RAY;  Surgeon: Rosetta Posner, DPM;  Location: [facility];  Service: Podiatry;  Laterality: Right;  ? AMPUTATION TOE Right 07/05/2021  ? Procedure: AMPUTATION RIGHT 4TH TOE;  Surgeon: Linus Galas, DPM;  Location: [facility];  Service: Podiatry;  Laterality: Right;  ? HERNIA REPAIR    ? INCISION AND DRAINAGE OF WOUND Left 10/22/2021  ? Procedure: IRRIGATION AND DEBRIDEMENT WOUND;  Surgeon: Rosetta Posner, DPM;  Location: [facility];  Service: Podiatry;  Laterality: Left;  ? TOE ARTHROPLASTY Left 10/22/2021  ? Procedure: TOE ARTHROPLASTY;  Surgeon: Rosetta Posner, DPM;  Location: [facility];  Service: Podiatry;  Laterality: Left;  ?  ?Social History  ? ?Socioeconomic History  ? Marital status: Married  ?  Spouse name: Not on file  ? Number of children: Not on file  ? Years of education: Not on file  ? Highest education level: Not on file  ?Occupational History   ? Not on file  ?Tobacco Use  ? Smoking status: Never  ? Smokeless tobacco: Never  ?Vaping Use  ? Vaping Use: Never used  ?Substance and Sexual Activity  ? Alcohol use: Not Currently  ? Drug use: Not Currently  ? Sexual activity: Not Currently  ?Other Topics Concern  ? Not on file  ?Social History Narrative  ? Not on file  ? ?Social Determinants of Health  ? ?Financial Resource Strain: Not on file  ?Food Insecurity: Not on file  ?Transportation Needs: Not on file  ?Physical Activity: Not on file  ?Stress: Not on file  ?Social Connections: Not on file  ?Intimate Partner Violence: Not on file  ?  ?Family History  ?Problem Relation Age of Onset  ? Diabetes Mother   ? Diabetes Father   ? Parkinson's disease Brother   ? ?No Known Allergies ?I? ?Current Outpatient Medications  ?Medication Sig Dispense Refill  ? amoxicillin-clavulanate (AUGMENTIN) 875-125 MG tablet Take 1 tablet by mouth 2 (two) times daily for 28 days. 56 tablet 0  ? aspirin EC 81 MG tablet Take 81 mg by mouth daily. Swallow whole.    ? atenolol (TENORMIN) 25 MG tablet Take 25 mg by mouth daily.    ? cholecalciferol (VITAMIN D3) 25 MCG (1000 UNIT) tablet Take 1,000 Units by mouth daily.    ? ciprofloxacin (CIPRO) 500 MG tablet Take 1 tablet (500 mg total) by mouth 2 (two) times daily for 28  days. 56 tablet 0  ? Dapagliflozin-metFORMIN HCl ER (XIGDUO XR) 11-998 MG TB24 Take 1 tablet by mouth daily at 2 am.    ? Insulin Glargine (BASAGLAR KWIKPEN ) Inject 36 Units into the skin daily.    ? levothyroxine (SYNTHROID) 25 MCG tablet Take 25 mcg by mouth daily.    ? rosuvastatin (CRESTOR) 20 MG tablet Take 20 mg by mouth daily.    ? ?No current facility-administered medications for this visit.  ?  ? ?Abtx:  ?Anti-infectives (From admission, onward)  ? ? None  ? ?  ? ? ?REVIEW OF SYSTEMS:  ?Const: negative fever, negative chills, negative weight loss ?Eyes: negative diplopia or visual changes, negative eye pain ?ENT: negative coryza, negative sore throat ?Resp:  negative cough, hemoptysis, dyspnea ?Cards: negative for chest pain, palpitations, lower extremity edema ?GU: negative for frequency, dysuria and hematuria ?GI: Negative for abdominal pain, diarrhea, bleeding, constipation ?Skin: negative for rash and pruritus ?Heme: negative for easy bruising and gum/nose bleeding ?MS: negative for myalgias, arthralgias, back pain and muscle weakness ?Neurolo:negative for headaches, dizziness, vertigo, memory problems  ?Psych: negative for feelings of anxiety, depression  ?Endocrine:  diabetes ?Allergy/Immunology- negative for any medication or food allergies ?? ?Objective:  ?VITALS:  ?BP (!) 147/82   Pulse (!) 53   Temp (!) 97.5 ?F (36.4 ?C) (Temporal)   Wt 183 lb (83 kg)   BMI 24.82 kg/m?  ?PHYSICAL EXAM:  ?General: Alert, cooperative, no distress, appears stated age.  ?Head: Normocephalic, without obvious abnormality, atraumatic. ?Eyes: Conjunctivae clear, anicteric sclerae. Pupils are equal ?ENT Nares normal. No drainage or sinus tenderness. ?Lips, mucosa, and tongue normal. No Thrush ?Neck: Supple, symmetrical, no adenopathy, thyroid: non tender ?no carotid bruit and no JVD. ?Back: No CVA tenderness. ?Lungs: Clear to auscultation bilaterally. No Wheezing or Rhonchi. No rales. ?Heart: Regular rate and rhythm, no murmur, rub or gallop. ?Abdomen: Soft, non-tender,not distended. Bowel sounds normal. No masses ?Extremities:  ?Rt foot ? ? ? ? ? ? ? ? ?Skin: No rashes or lesions. Or bruising ?Lymph: Cervical, supraclavicular normal. ?Neurologic: Grossly non-focal ?Pertinent Labs ?none? ?Impression/Recommendation ?? ?Diabetic foot infection ?Recent 3rd toe ray excision ?Left 2nd toe infection s/p surgery- on cipro and augmentin ?Will do labs today- may extend 2 more weeks of antibiotic ? ?DM on dapoagliflozin and metformin ? ?Hypothyroidism on synthroid ? ?HLD- on crestor ? ? ?Discussed the management with the patient in  detail ? ??will follow him  PRN ?__________________________________ ?Note:  This document was prepared using Dragon voice recognition software and may include unintentional dictation errors.  ?

## 2021-11-17 ENCOUNTER — Encounter: Payer: Self-pay | Admitting: Infectious Diseases

## 2021-11-17 MED ORDER — AMOXICILLIN-POT CLAVULANATE 875-125 MG PO TABS: 1.0000 | ORAL_TABLET | Freq: Two times a day (BID) | ORAL | 0 refills | Status: AC

## 2021-11-17 MED ORDER — CIPROFLOXACIN HCL 500 MG PO TABS: 500.0000 mg | ORAL_TABLET | Freq: Two times a day (BID) | ORAL | 0 refills | Status: AC

## 2021-11-20 NOTE — Telephone Encounter (Signed)
-----   Message from Lynn Ito, MD sent at 11/18/2021 12:23 PM EDT ----- ?Please let him know the labs look good and I have sent a prescription for both antibiotics to continue. Thx ? ?----- Message ----- ?From: Interface, Lab In [facility] ?Sent: 11/13/2021  10:28 AM EDT ?To: Lynn Ito, MD ? ? ?

## 2021-11-20 NOTE — Telephone Encounter (Signed)
I spoke to the patient and relayed to him that lab results are good per Dr. Rivka Safer. Patient has also picked up both antibiotics.  ?Alexander Gill T Kellyjo Edgren ? ?

## 2022-11-18 DIAGNOSIS — I059 Rheumatic mitral valve disease, unspecified: Secondary | ICD-10-CM

## 2022-11-18 HISTORY — DX: Rheumatic mitral valve disease, unspecified: I05.9

## 2022-11-20 ENCOUNTER — Encounter: Payer: Self-pay | Admitting: Emergency Medicine

## 2022-11-20 ENCOUNTER — Inpatient Hospital Stay
Admission: EM | Admit: 2022-11-20 | Discharge: 2022-11-27 | DRG: 853 | Disposition: A | Discharge status: Skilled Nursing Facility | Payer: BC Managed Care – PPO | Attending: Internal Medicine | Admitting: Internal Medicine

## 2022-11-20 ENCOUNTER — Other Ambulatory Visit: Payer: Self-pay

## 2022-11-20 ENCOUNTER — Inpatient Hospital Stay: Payer: BC Managed Care – PPO

## 2022-11-20 ENCOUNTER — Emergency Department: Payer: BC Managed Care – PPO

## 2022-11-20 DIAGNOSIS — Z833 Family history of diabetes mellitus: Secondary | ICD-10-CM | POA: Diagnosis not present

## 2022-11-20 DIAGNOSIS — Z794 Long term (current) use of insulin: Secondary | ICD-10-CM

## 2022-11-20 DIAGNOSIS — E11628 Type 2 diabetes mellitus with other skin complications: Secondary | ICD-10-CM | POA: Diagnosis present

## 2022-11-20 DIAGNOSIS — A4101 Sepsis due to Methicillin susceptible Staphylococcus aureus: Principal | ICD-10-CM | POA: Diagnosis present

## 2022-11-20 DIAGNOSIS — R7881 Bacteremia: Secondary | ICD-10-CM | POA: Diagnosis not present

## 2022-11-20 DIAGNOSIS — B9561 Methicillin susceptible Staphylococcus aureus infection as the cause of diseases classified elsewhere: Secondary | ICD-10-CM | POA: Diagnosis not present

## 2022-11-20 DIAGNOSIS — I340 Nonrheumatic mitral (valve) insufficiency: Secondary | ICD-10-CM | POA: Diagnosis present

## 2022-11-20 DIAGNOSIS — E11621 Type 2 diabetes mellitus with foot ulcer: Secondary | ICD-10-CM | POA: Diagnosis present

## 2022-11-20 DIAGNOSIS — E876 Hypokalemia: Secondary | ICD-10-CM | POA: Diagnosis present

## 2022-11-20 DIAGNOSIS — Z7984 Long term (current) use of oral hypoglycemic drugs: Secondary | ICD-10-CM

## 2022-11-20 DIAGNOSIS — L02612 Cutaneous abscess of left foot: Secondary | ICD-10-CM | POA: Diagnosis present

## 2022-11-20 DIAGNOSIS — E11610 Type 2 diabetes mellitus with diabetic neuropathic arthropathy: Secondary | ICD-10-CM | POA: Diagnosis present

## 2022-11-20 DIAGNOSIS — L089 Local infection of the skin and subcutaneous tissue, unspecified: Secondary | ICD-10-CM | POA: Diagnosis not present

## 2022-11-20 DIAGNOSIS — Z89421 Acquired absence of other right toe(s): Secondary | ICD-10-CM | POA: Diagnosis not present

## 2022-11-20 DIAGNOSIS — E1169 Type 2 diabetes mellitus with other specified complication: Secondary | ICD-10-CM | POA: Diagnosis present

## 2022-11-20 DIAGNOSIS — Z820 Family history of epilepsy and other diseases of the nervous system: Secondary | ICD-10-CM | POA: Diagnosis not present

## 2022-11-20 DIAGNOSIS — Z79899 Other long term (current) drug therapy: Secondary | ICD-10-CM

## 2022-11-20 DIAGNOSIS — A419 Sepsis, unspecified organism: Secondary | ICD-10-CM | POA: Diagnosis present

## 2022-11-20 DIAGNOSIS — Z7982 Long term (current) use of aspirin: Secondary | ICD-10-CM | POA: Diagnosis not present

## 2022-11-20 DIAGNOSIS — E1140 Type 2 diabetes mellitus with diabetic neuropathy, unspecified: Secondary | ICD-10-CM | POA: Diagnosis present

## 2022-11-20 DIAGNOSIS — M86172 Other acute osteomyelitis, left ankle and foot: Secondary | ICD-10-CM | POA: Diagnosis present

## 2022-11-20 DIAGNOSIS — L03115 Cellulitis of right lower limb: Principal | ICD-10-CM | POA: Diagnosis present

## 2022-11-20 DIAGNOSIS — I10 Essential (primary) hypertension: Secondary | ICD-10-CM | POA: Diagnosis not present

## 2022-11-20 DIAGNOSIS — M869 Osteomyelitis, unspecified: Secondary | ICD-10-CM | POA: Diagnosis present

## 2022-11-20 DIAGNOSIS — Z79890 Hormone replacement therapy (postmenopausal): Secondary | ICD-10-CM

## 2022-11-20 DIAGNOSIS — M86072 Acute hematogenous osteomyelitis, left ankle and foot: Secondary | ICD-10-CM | POA: Diagnosis not present

## 2022-11-20 DIAGNOSIS — I330 Acute and subacute infective endocarditis: Secondary | ICD-10-CM | POA: Diagnosis present

## 2022-11-20 DIAGNOSIS — L97529 Non-pressure chronic ulcer of other part of left foot with unspecified severity: Secondary | ICD-10-CM | POA: Diagnosis present

## 2022-11-20 DIAGNOSIS — E785 Hyperlipidemia, unspecified: Secondary | ICD-10-CM | POA: Diagnosis present

## 2022-11-20 DIAGNOSIS — L97509 Non-pressure chronic ulcer of other part of unspecified foot with unspecified severity: Secondary | ICD-10-CM | POA: Diagnosis not present

## 2022-11-20 DIAGNOSIS — R590 Localized enlarged lymph nodes: Secondary | ICD-10-CM | POA: Diagnosis present

## 2022-11-20 DIAGNOSIS — E039 Hypothyroidism, unspecified: Secondary | ICD-10-CM | POA: Diagnosis present

## 2022-11-20 LAB — CBC WITH DIFFERENTIAL/PLATELET
Abs Immature Granulocytes: 0.21 10*3/uL — ABNORMAL HIGH (ref 0.00–0.07)
Basophils Absolute: 0.1 10*3/uL (ref 0.0–0.1)
Basophils Relative: 0 %
Eosinophils Absolute: 0.1 10*3/uL (ref 0.0–0.5)
Eosinophils Relative: 1 %
HCT: 37.8 % — ABNORMAL LOW (ref 39.0–52.0)
Hemoglobin: 12.3 g/dL — ABNORMAL LOW (ref 13.0–17.0)
Immature Granulocytes: 1 %
Lymphocytes Relative: 2 %
Lymphs Abs: 0.4 10*3/uL — ABNORMAL LOW (ref 0.7–4.0)
MCH: 27.8 pg (ref 26.0–34.0)
MCHC: 32.5 g/dL (ref 30.0–36.0)
MCV: 85.5 fL (ref 80.0–100.0)
Monocytes Absolute: 0.7 10*3/uL (ref 0.1–1.0)
Monocytes Relative: 4 %
Neutro Abs: 17.9 10*3/uL — ABNORMAL HIGH (ref 1.7–7.7)
Neutrophils Relative %: 92 %
Platelets: 331 10*3/uL (ref 150–400)
RBC: 4.42 MIL/uL (ref 4.22–5.81)
RDW: 14.5 % (ref 11.5–15.5)
WBC: 19.4 10*3/uL — ABNORMAL HIGH (ref 4.0–10.5)
nRBC: 0 % (ref 0.0–0.2)

## 2022-11-20 LAB — HIV ANTIBODY (ROUTINE TESTING W REFLEX): HIV Screen 4th Generation wRfx: NONREACTIVE

## 2022-11-20 LAB — CBG MONITORING, ED
Glucose-Capillary: 201 mg/dL — ABNORMAL HIGH (ref 70–99)
Glucose-Capillary: 216 mg/dL — ABNORMAL HIGH (ref 70–99)
Glucose-Capillary: 197 mg/dL — ABNORMAL HIGH (ref 70–99)

## 2022-11-20 LAB — SEDIMENTATION RATE: Sed Rate: 100 mm/hr — ABNORMAL HIGH (ref 0–20)

## 2022-11-20 LAB — PROCALCITONIN: Procalcitonin: 1.56 ng/mL

## 2022-11-20 LAB — COMPREHENSIVE METABOLIC PANEL
ALT: 14 U/L (ref 0–44)
AST: 18 U/L (ref 15–41)
Albumin: 2.6 g/dL — ABNORMAL LOW (ref 3.5–5.0)
Alkaline Phosphatase: 105 U/L (ref 38–126)
Anion gap: 11 (ref 5–15)
BUN: 19 mg/dL (ref 8–23)
CO2: 23 mmol/L (ref 22–32)
Calcium: 8.5 mg/dL — ABNORMAL LOW (ref 8.9–10.3)
Chloride: 100 mmol/L (ref 98–111)
Creatinine, Ser: 0.9 mg/dL (ref 0.61–1.24)
GFR, Estimated: >60 mL/min (ref >60)
Glucose, Bld: 154 mg/dL — ABNORMAL HIGH (ref 70–99)
Potassium: 3.4 mmol/L — ABNORMAL LOW (ref 3.5–5.1)
Sodium: 134 mmol/L — ABNORMAL LOW (ref 135–145)
Total Bilirubin: 1.2 mg/dL (ref 0.3–1.2)
Total Protein: 7.7 g/dL (ref 6.5–8.1)

## 2022-11-20 LAB — PROTIME-INR
INR: 1.4 — ABNORMAL HIGH (ref 0.8–1.2)
Prothrombin Time: 16.9 seconds — ABNORMAL HIGH (ref 11.4–15.2)

## 2022-11-20 LAB — LACTIC ACID, PLASMA: Lactic Acid, Venous: 1.3 mmol/L (ref 0.5–1.9)

## 2022-11-20 LAB — MAGNESIUM: Magnesium: 2 mg/dL (ref 1.7–2.4)

## 2022-11-20 LAB — APTT: aPTT: 34 seconds (ref 24–36)

## 2022-11-20 LAB — C-REACTIVE PROTEIN: CRP: 25.1 mg/dL — ABNORMAL HIGH (ref <1.0)

## 2022-11-20 MED ORDER — METRONIDAZOLE 500 MG/100ML IV SOLN
500.0000 mg | Freq: Two times a day (BID) | INTRAVENOUS | Status: DC
  Administered 2022-11-20 – 2022-11-22 (×5): 500 mg via INTRAVENOUS
  Filled 2022-11-20 (×6): qty 100

## 2022-11-20 MED ORDER — VITAMIN D 25 MCG (1000 UNIT) PO TABS
1000.0000 [IU] | ORAL_TABLET | Freq: Every day | ORAL | Status: DC
  Administered 2022-11-20 – 2022-11-27 (×7): 1000 [IU] via ORAL
  Filled 2022-11-20 (×7): qty 1

## 2022-11-20 MED ORDER — INSULIN GLARGINE-YFGN 100 UNIT/ML ~~LOC~~ SOLN
24.0000 [IU] | Freq: Every day | SUBCUTANEOUS | Status: DC
  Administered 2022-11-20 – 2022-11-26 (×7): 24 [IU] via SUBCUTANEOUS
  Filled 2022-11-20 (×8): qty 0.24

## 2022-11-20 MED ORDER — PIPERACILLIN-TAZOBACTAM 3.375 G IVPB 30 MIN
3.3750 g | Freq: Once | INTRAVENOUS | Status: AC
  Administered 2022-11-20: 3.375 g via INTRAVENOUS
  Filled 2022-11-20: qty 50

## 2022-11-20 MED ORDER — ACETAMINOPHEN 325 MG PO TABS
650.0000 mg | ORAL_TABLET | Freq: Once | ORAL | Status: AC
  Administered 2022-11-20: 650 mg via ORAL
  Filled 2022-11-20: qty 2

## 2022-11-20 MED ORDER — SODIUM CHLORIDE 0.9 % IV BOLUS
1000.0000 mL | Freq: Once | INTRAVENOUS | Status: AC
  Administered 2022-11-20: 1000 mL via INTRAVENOUS

## 2022-11-20 MED ORDER — HEPARIN SODIUM (PORCINE) 5000 UNIT/ML IJ SOLN
5000.0000 [IU] | Freq: Three times a day (TID) | INTRAMUSCULAR | Status: DC
  Administered 2022-11-20 – 2022-11-27 (×20): 5000 [IU] via SUBCUTANEOUS
  Filled 2022-11-20 (×19): qty 1

## 2022-11-20 MED ORDER — ROSUVASTATIN CALCIUM 10 MG PO TABS
20.0000 mg | ORAL_TABLET | Freq: Every day | ORAL | Status: DC
  Administered 2022-11-20 – 2022-11-27 (×7): 20 mg via ORAL
  Filled 2022-11-20: qty 2
  Filled 2022-11-20: qty 1
  Filled 2022-11-20 (×3): qty 2
  Filled 2022-11-20 (×2): qty 1
  Filled 2022-11-20: qty 2

## 2022-11-20 MED ORDER — HYDRALAZINE HCL 20 MG/ML IJ SOLN: 5.0000 mg | INTRAMUSCULAR | Status: DC | PRN

## 2022-11-20 MED ORDER — ACETAMINOPHEN 325 MG PO TABS
650.0000 mg | ORAL_TABLET | Freq: Four times a day (QID) | ORAL | Status: DC | PRN
  Filled 2022-11-20: qty 2

## 2022-11-20 MED ORDER — ONDANSETRON HCL 4 MG/2ML IJ SOLN: 4.0000 mg | Freq: Three times a day (TID) | INTRAMUSCULAR | Status: DC | PRN

## 2022-11-20 MED ORDER — VANCOMYCIN HCL 2000 MG/400ML IV SOLN
2000.0000 mg | Freq: Once | INTRAVENOUS | Status: AC
  Administered 2022-11-20: 2000 mg via INTRAVENOUS
  Filled 2022-11-20: qty 400

## 2022-11-20 MED ORDER — LEVOTHYROXINE SODIUM 25 MCG PO TABS
25.0000 ug | ORAL_TABLET | Freq: Every day | ORAL | Status: DC
  Administered 2022-11-21 – 2022-11-27 (×6): 25 ug via ORAL
  Filled 2022-11-20 (×7): qty 1

## 2022-11-20 MED ORDER — OXYCODONE HCL 5 MG PO TABS
5.0000 mg | ORAL_TABLET | ORAL | Status: DC | PRN
  Administered 2022-11-21 (×2): 5 mg via ORAL
  Filled 2022-11-20 (×2): qty 1

## 2022-11-20 MED ORDER — VANCOMYCIN HCL IN DEXTROSE 1-5 GM/200ML-% IV SOLN: 1000.0000 mg | Freq: Once | INTRAVENOUS | Status: DC

## 2022-11-20 MED ORDER — INSULIN ASPART 100 UNIT/ML IJ SOLN
0.0000 [IU] | Freq: Every day | INTRAMUSCULAR | Status: DC
  Administered 2022-11-21 – 2022-11-23 (×3): 2 [IU] via SUBCUTANEOUS
  Administered 2022-11-25: 3 [IU] via SUBCUTANEOUS
  Administered 2022-11-26: 4 [IU] via SUBCUTANEOUS
  Filled 2022-11-20 (×5): qty 1

## 2022-11-20 MED ORDER — ASPIRIN 81 MG PO TBEC
81.0000 mg | DELAYED_RELEASE_TABLET | Freq: Every day | ORAL | Status: DC
  Administered 2022-11-20 – 2022-11-27 (×7): 81 mg via ORAL
  Filled 2022-11-20 (×7): qty 1

## 2022-11-20 MED ORDER — POTASSIUM CHLORIDE CRYS ER 20 MEQ PO TBCR
40.0000 meq | EXTENDED_RELEASE_TABLET | Freq: Once | ORAL | Status: AC
  Administered 2022-11-20: 40 meq via ORAL
  Filled 2022-11-20: qty 2

## 2022-11-20 MED ORDER — BASAGLAR KWIKPEN 100 UNIT/ML ~~LOC~~ SOPN: 24.0000 [IU] | PEN_INJECTOR | Freq: Every day | SUBCUTANEOUS | Status: DC

## 2022-11-20 MED ORDER — LACTATED RINGERS IV SOLN: INTRAVENOUS | Status: AC

## 2022-11-20 MED ORDER — INSULIN ASPART 100 UNIT/ML IJ SOLN
0.0000 [IU] | Freq: Three times a day (TID) | INTRAMUSCULAR | Status: DC
  Administered 2022-11-20: 3 [IU] via SUBCUTANEOUS
  Administered 2022-11-21: 1 [IU] via SUBCUTANEOUS
  Administered 2022-11-21: 5 [IU] via SUBCUTANEOUS
  Administered 2022-11-21: 3 [IU] via SUBCUTANEOUS
  Administered 2022-11-22: 2 [IU] via SUBCUTANEOUS
  Administered 2022-11-22: 5 [IU] via SUBCUTANEOUS
  Administered 2022-11-22: 2 [IU] via SUBCUTANEOUS
  Administered 2022-11-23: 5 [IU] via SUBCUTANEOUS
  Administered 2022-11-23: 1 [IU] via SUBCUTANEOUS
  Administered 2022-11-23: 5 [IU] via SUBCUTANEOUS
  Administered 2022-11-24: 1 [IU] via SUBCUTANEOUS
  Administered 2022-11-24: 2 [IU] via SUBCUTANEOUS
  Administered 2022-11-24 – 2022-11-25 (×2): 5 [IU] via SUBCUTANEOUS
  Administered 2022-11-25: 1 [IU] via SUBCUTANEOUS
  Administered 2022-11-26: 2 [IU] via SUBCUTANEOUS
  Administered 2022-11-26: 1 [IU] via SUBCUTANEOUS
  Administered 2022-11-27: 7 [IU] via SUBCUTANEOUS
  Filled 2022-11-20 (×18): qty 1

## 2022-11-20 MED ORDER — SODIUM CHLORIDE 0.9 % IV SOLN
2.0000 g | INTRAVENOUS | Status: DC
  Administered 2022-11-20: 2 g via INTRAVENOUS
  Filled 2022-11-20: qty 20

## 2022-11-20 NOTE — Consult Note (Signed)
Reason for Consult: Osteomyelitis with abscess left foot Referring Physician: [name] is an 67 y.o. male.  HPI: This is a 67 year old diabetic male with neuropathy with history of previous amputations.  Has had an ulceration on the plantar aspect of his left foot for the last couple of weeks and just recently noticed and opened draining wound on the top of his left foot.  Also relates some swelling and redness for the last week in his right foot.  Does not recall any injury.  Past Medical History:  Diagnosis Date   Cataract    bilateral eyes   Diabetes mellitus without complication (HCC)    Heart abnormality    Enlarged left ventricle   Hypertension     Past Surgical History:  Procedure Laterality Date   AMPUTATION Right 10/22/2021   Procedure: AMPUTATION RAY-PARTIAL 3RD RAY;  Surgeon: Alexander Gill, DPM;  Location: [facility];  Service: Podiatry;  Laterality: Right;   AMPUTATION TOE Right 07/05/2021   Procedure: AMPUTATION RIGHT 4TH TOE;  Surgeon: Alexander Gill, DPM;  Location: [facility];  Service: Podiatry;  Laterality: Right;   HERNIA REPAIR     INCISION AND DRAINAGE OF WOUND Left 10/22/2021   Procedure: IRRIGATION AND DEBRIDEMENT WOUND;  Surgeon: Alexander Gill, DPM;  Location: [facility];  Service: Podiatry;  Laterality: Left;   TOE ARTHROPLASTY Left 10/22/2021   Procedure: TOE ARTHROPLASTY;  Surgeon: Alexander Gill, DPM;  Location: [facility];  Service: Podiatry;  Laterality: Left;    Family History  Problem Relation Age of Onset   Diabetes Mother    Diabetes Father    Parkinson's disease Brother     Social History:  reports that he has never smoked. He has never used smokeless tobacco. He reports that he does not currently use alcohol. He reports that he does not currently use drugs.  Allergies: No Known Allergies  Medications: Continuous:  cefTRIAXone (ROCEPHIN)  IV     lactated ringers 75 mL/hr at 11/20/22 1845   metronidazole      Results for orders placed or  performed during the hospital encounter of 11/20/22 (from the past 48 hour(s))  Lactic acid, plasma     Status: None   Collection Time: 11/20/22  1:45 PM  Result Value Ref Range   Lactic Acid, Venous 1.3 0.5 - 1.9 mmol/L    Comment: Performed at [facility], [address] [place], [place] [address]  Comprehensive metabolic panel     Status: Abnormal   Collection Time: 11/20/22  1:45 PM  Result Value Ref Range   Sodium 134 (L) 135 - 145 mmol/L   Potassium 3.4 (L) 3.5 - 5.1 mmol/L   Chloride 100 98 - 111 mmol/L   CO2 23 22 - 32 mmol/L   Glucose, Bld 154 (H) 70 - 99 mg/dL    Comment: Glucose reference range applies only to samples taken after fasting for at least 8 hours.   BUN 19 8 - 23 mg/dL   Creatinine, Ser 5.28 0.61 - 1.24 mg/dL   Calcium 8.5 (L) 8.9 - 10.3 mg/dL   Total Protein 7.7 6.5 - 8.1 g/dL   Albumin 2.6 (L) 3.5 - 5.0 g/dL   AST 18 15 - 41 U/L   ALT 14 0 - 44 U/L   Alkaline Phosphatase 105 38 - 126 U/L   Total Bilirubin 1.2 0.3 - 1.2 mg/dL   GFR, Estimated >41 >32 mL/min    Comment: (NOTE) Calculated using the CKD-EPI Creatinine Equation (2021)  Anion gap 11 5 - 15    Comment: Performed at [facility], [address] [place], [place] [address]  CBC with Differential     Status: Abnormal   Collection Time: 11/20/22  1:45 PM  Result Value Ref Range   WBC 19.4 (H) 4.0 - 10.5 K/uL   RBC 4.42 4.22 - 5.81 MIL/uL   Hemoglobin 12.3 (L) 13.0 - 17.0 g/dL   HCT 60.4 (L) 54.0 - 98.1 %   MCV 85.5 80.0 - 100.0 fL   MCH 27.8 26.0 - 34.0 pg   MCHC 32.5 30.0 - 36.0 g/dL   RDW 19.1 47.8 - 29.5 %   Platelets 331 150 - 400 K/uL   nRBC 0.0 0.0 - 0.2 %   Neutrophils Relative % 92 %   Neutro Abs 17.9 (H) 1.7 - 7.7 K/uL   Lymphocytes Relative 2 %   Lymphs Abs 0.4 (L) 0.7 - 4.0 K/uL   Monocytes Relative 4 %   Monocytes Absolute 0.7 0.1 - 1.0 K/uL   Eosinophils Relative 1 %   Eosinophils Absolute 0.1 0.0 - 0.5 K/uL   Basophils Relative 0 %   Basophils  Absolute 0.1 0.0 - 0.1 K/uL   Immature Granulocytes 1 %   Abs Immature Granulocytes 0.21 (H) 0.00 - 0.07 K/uL    Comment: Performed at [facility], [address], [place], [place] [address]  Magnesium     Status: None   Collection Time: 11/20/22  1:45 PM  Result Value Ref Range   Magnesium 2.0 1.7 - 2.4 mg/dL    Comment: Performed at [facility], [address] [place], [place] [address]  Sedimentation rate     Status: Abnormal   Collection Time: 11/20/22  1:45 PM  Result Value Ref Range   Sed Rate 100 (H) 0 - 20 mm/hr    Comment: Performed at [facility], [address], [place], [place] [address]  Procalcitonin     Status: None   Collection Time: 11/20/22  1:45 PM  Result Value Ref Range   Procalcitonin 1.56 ng/mL    Comment:        Interpretation: PCT > 0.5 ng/mL and <= 2 ng/mL: Systemic infection (sepsis) is possible, but other conditions are known to elevate PCT as well. (NOTE)       Sepsis PCT Algorithm           Lower Respiratory Tract                                      Infection PCT Algorithm    ----------------------------     ----------------------------         PCT < 0.25 ng/mL                PCT < 0.10 ng/mL          Strongly encourage             Strongly discourage   discontinuation of antibiotics    initiation of antibiotics    ----------------------------     -----------------------------       PCT 0.25 - 0.50 ng/mL            PCT 0.10 - 0.25 ng/mL               OR       >80% decrease in PCT  Discourage initiation of                                            antibiotics      Encourage discontinuation           of antibiotics    ----------------------------     -----------------------------         PCT >= 0.50 ng/mL              PCT 0.26 - 0.50 ng/mL                AND       <80% decrease in PCT             Encourage initiation of                                             antibiotics       Encourage  continuation           of antibiotics    ----------------------------     -----------------------------        PCT >= 0.50 ng/mL                  PCT > 0.50 ng/mL               AND         increase in PCT                  Strongly encourage                                      initiation of antibiotics    Strongly encourage escalation           of antibiotics                                     -----------------------------                                           PCT <= 0.25 ng/mL                                                 OR                                        > 80% decrease in PCT                                      Discontinue / Do not initiate  antibiotics  Performed at [facility], [address] [place], [place] [address]   Protime-INR     Status: Abnormal   Collection Time: 11/20/22  3:31 PM  Result Value Ref Range   Prothrombin Time 16.9 (H) 11.4 - 15.2 seconds   INR 1.4 (H) 0.8 - 1.2    Comment: (NOTE) INR goal varies based on device and disease states. Performed at [facility], [address] [place], [place] [address]   APTT     Status: None   Collection Time: 11/20/22  3:31 PM  Result Value Ref Range   aPTT 34 24 - 36 seconds    Comment: Performed at [facility], [address] [place], [place] [address]  CBG monitoring, ED     Status: Abnormal   Collection Time: 11/20/22  5:44 PM  Result Value Ref Range   Glucose-Capillary 201 (H) 70 - 99 mg/dL    Comment: Glucose reference range applies only to samples taken after fasting for at least 8 hours.    US Venous Img Lower Unilateral Right  Result Date: 11/20/2022 CLINICAL DATA:  Swelling EXAM: RIGHT LOWER EXTREMITY VENOUS DOPPLER ULTRASOUND TECHNIQUE: Gray-scale sonography with compression, as well as color and duplex ultrasound, were performed to evaluate the deep venous system(s) from the level of the common femoral vein  through the popliteal and proximal calf veins. COMPARISON:  None Available. FINDINGS: VENOUS Normal compressibility of the common femoral, superficial femoral, and popliteal veins, as well as the visualized calf veins. Visualized portions of profunda femoral vein and great saphenous vein unremarkable. No filling defects to suggest DVT on grayscale or color Doppler imaging. Doppler waveforms show normal direction of venous flow, normal respiratory plasticity and response to augmentation. Limited views of the contralateral common femoral vein are unremarkable. OTHER Enlarged right inguinal lymph node is present measuring 2.1 x 1.1 x 1.7 cm. Limitations: none IMPRESSION: 1. No evidence of right lower extremity DVT. 2. Enlarged right inguinal lymph node. Electronically Signed   By: Darliss Cheney M.D.   On: 11/20/2022 16:03   DG Foot Complete Right  Result Date: 11/20/2022 CLINICAL DATA:  Wounds on both feet.  Fever.  Generalized malaise. EXAM: RIGHT FOOT COMPLETE - 3+ VIEW COMPARISON:  10/22/2021. FINDINGS: Posterior dislocation of the second toe proximal phalanx in relation to the second metatarsal head, new since the prior exam. Resorption of the head of the third metatarsal as progressed since the prior study. Margin is now smooth consistent with the sequela of osteomyelitis. There is also a smooth area of resorption along the lateral fifth metatarsal head. Lucency and sclerosis noted in midfoot with bony fragmentation, new since the prior exam. This is noted involving the navicular medial cuneiform articulations and the medial and lateral cuneiform metatarsal articulations predominantly. 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 Diffuse surrounding soft tissue edema.  No soft tissue air. IMPRESSION: 1. Significant interval changes since the prior right foot radiographs. 2. Posterior dislocation of the second toe at the second MTP joint. 3. Advanced arthropathic changes now noted at the midfoot. Although  this may be due to diabetic neural arthropathy, septic arthropathy should be considered. 4. No other evidence of active osteomyelitis. 5. Significant diffuse soft tissue swelling. Electronically Signed   By: Amie Portland M.D.   On: 11/20/2022 15:24   DG Foot Complete Left  Result Date: 11/20/2022 CLINICAL DATA:  Wounds to both feet. Feet swelling. Fever. Generalized malaise. EXAM: LEFT FOOT - COMPLETE 3+ VIEW COMPARISON:  10/22/2021 and older exams. FINDINGS: There is significant resorption of the first metatarsal head with a concave deformity of the base of the great toe proximal phalanx. These findings are new since the prior study. There is also been resorption of the distal shaft and head of the proximal phalanx of the great toe with mild resorption at the base of the great toe distal phalanx. There is a mildly comminuted fracture across the base of the second metatarsal head, also new. There is been interval fusion of the PIP joint of the second toe. Chronic appearing resorption is noted of the distal phalanx of the third toe. There is a smooth area of resorption/remottling along the lateral head of the third metatarsal. There is significant forefoot soft tissue swelling. No soft tissue air. IMPRESSION: 1. Significant changes since the prior left foot radiographs. Resorptive changes at the first metatarsophalangeal joint is consistent with osteomyelitis, possibly a septic arthritis. Additional bone resorption of the great toe proximal phalanx and base of the distal phalanx of the great toe is also suspected to be osteomyelitis. New comminuted fracture across the head of the second metatarsal likely due to underlying osteomyelitis. Electronically Signed   By: Amie Portland M.D.   On: 11/20/2022 15:20    Review of Systems  Constitutional:        Patient does relate recent fever and chills.  HENT:  Negative for sinus pain and sore throat.   Respiratory:  Negative for cough and shortness of breath.    Cardiovascular:  Negative for chest pain and palpitations.  Gastrointestinal:  Negative for nausea and vomiting.  Genitourinary:  Negative for frequency and urgency.  Musculoskeletal:        Has had some recent pain for the last week in his right foot.  Previous amputation of the right third and fourth toes.  Skin:        Relates an ulcer for the last 2 weeks on the bottom of his left foot.  Just recently noticed an open sore with drainage from the top of his left foot.  Swelling and redness in the right foot and leg over the last week.  Neurological:        Patient does relate significant neuropathy associated with his diabetes.  Psychiatric/Behavioral:  Negative for confusion. The patient is not nervous/anxious.    Blood pressure (!) 108/56, pulse 70, temperature 98.6 F (37 C), temperature source Oral, resp. rate 18, height 6\' 1"  (1.854 m), weight 83.9 kg, SpO2 94 %. Physical Exam Cardiovascular:     Comments: DP and PT pulses 2/4 on the left, 1/4 on the right but this could be due to significant edema. Musculoskeletal:     Comments: Previous amputation of the right third and fourth toes.  Does appear to be some collapse of the arch on the right foot as compared to the left.  Muscle testing is deferred.  Skin:    Comments: Significant erythema and edema is noted in the right foot extending into the leg.  Increased skin temperature to the foot.  No open lesions on the right foot.  Superficial appearing ulceration noted on the plantar aspect of the left second metatarsal area.  Dorsal wound and abscess is noted over the second metatarsal head area with some swelling and redness and drainage.  Neurological:     Comments: Loss of protective threshold with monofilament wire in the forefoot and toes bilateral.          Assessment/Plan: Assessment: 1.  Diabetes  with associated neuropathy. 2.  Osteomyelitis left second metatarsal head and potentially first metatarsal. 3.  Cellulitis  with abscess left foot. 4.  Superficial ulceration left forefoot. 5.  Diabetic Charcot foot right.  Plan: Culture taken of the abscess on the left foot.  Discussed with the patient that he does have a bone infection and at least the second metatarsal but potentially could involve the first as well.  Discussed that he will need amputation of the second ray with potentially the first ray as well.  Possible risks and complications of the procedure were discussed including mostly inability of the wound to heal due to significant infection or his diabetes which could necessitate further amputation or debridement and could potentially lead to higher amputation such as a below-knee amputation.  At this point waiting for MRI results but the plan is for surgery tomorrow morning.  Patient is n.p.o. after midnight.  We will obtain consent for second ray resection with I&D abscess and potential first ray resection.  Discussed with the patient that he does have Charcot deformity in the right foot.  Discussed that the treatment for this is absolute nonweightbearing and immobilization in a fracture boot.  Discussed potential consequences of weightbearing on the right foot which could cause further collapse of the arch and also could potentially lead to lower extremity amputation on the right side.  Patient voiced understanding of this.  Reevaluate tomorrow and plan for surgery tomorrow morning.  Ricci Barker 11/20/2022, 7:00 PM

## 2022-11-20 NOTE — ED Provider Notes (Signed)
[facility] Provider Note    Event Date/Time   First MD Initiated Contact with Patient 11/20/22 1446     (approximate)   History   Wound Infection and Weakness   HPI  Alexander Gill is a 67 y.o. male with diabetes, prior foot infections who comes in with concern for recurrent foot infections.  Patient reports having a wound noted on the left leg.  He states he tried again to podiatry but he cannot get appointment for 10 days.  He reports that today he had a fever.  He reports that he got Tylenol with EMS.  He denies any other cough or other symptoms.  He does report some redness on the right foo.  He thinks this just recently started  Physical Exam   Triage Vital Signs: ED Triage Vitals  Enc Vitals Group     BP 11/20/22 1339 120/62     Pulse Rate 11/20/22 1339 93     Resp 11/20/22 1339 20     Temp 11/20/22 1339 (!) 101.5 F (38.6 C)     Temp Source 11/20/22 1339 Oral     SpO2 11/20/22 1326 94 %     Weight 11/20/22 1339 185 lb (83.9 kg)     Height 11/20/22 1339 6\' 1"  (1.854 m)     Head Circumference --      Peak Flow --      Pain Score 11/20/22 1339 0     Pain Loc --      Pain Edu? --      Excl. in GC? --     Most recent vital signs: Vitals:   11/20/22 1326 11/20/22 1339  BP:  120/62  Pulse:  93  Resp:  20  Temp:  (!) 101.5 F (38.6 C)  SpO2: 94% 99%     General: Awake, no distress.  CV:  Good peripheral perfusion.  Resp:  Normal effort.  Abd:  No distention.  Other:  Patient has swelling noted in the right leg 2+ edema with good distal pulses bilaterally.  Is got redness in the right foot streaking up the right leg.  He is got prior amputations noted but he skin no open wounds noted.  On the left foot he is got a callus noted on the bottom of his foot and on the top of his foot he is got a blister with when you push it some pus drained out of it.  No significant redness on this foot.     ED Results / Procedures / Treatments    Labs (all labs ordered are listed, but only abnormal results are displayed) Labs Reviewed  COMPREHENSIVE METABOLIC PANEL - Abnormal; Notable for the following components:      Result Value   Sodium 134 (*)    Potassium 3.4 (*)    Glucose, Bld 154 (*)    Calcium 8.5 (*)    Albumin 2.6 (*)    All other components within normal limits  CBC WITH DIFFERENTIAL/PLATELET - Abnormal; Notable for the following components:   WBC 19.4 (*)    Hemoglobin 12.3 (*)    HCT 37.8 (*)    Neutro Abs 17.9 (*)    Lymphs Abs 0.4 (*)    Abs Immature Granulocytes 0.21 (*)    All other components within normal limits  CULTURE, BLOOD (ROUTINE X 2)  CULTURE, BLOOD (ROUTINE X 2)  LACTIC ACID, PLASMA  LACTIC ACID, PLASMA  URINALYSIS, ROUTINE W REFLEX MICROSCOPIC  RADIOLOGY I have reviewed the xray personally and interpreted and no evidence of any fracture  PROCEDURES:  Critical Care performed: Yes, see critical care procedure note(s)  .1-3 Lead EKG Interpretation  Performed by: Concha Se, MD Authorized by: Concha Se, MD     Interpretation: normal     ECG rate:  90   ECG rate assessment: normal     Rhythm: sinus rhythm     Ectopy: none     Conduction: normal   .Critical Care  Performed by: Concha Se, MD Authorized by: Concha Se, MD   Critical care provider statement:    Critical care time (minutes):  30   Critical care was necessary to treat or prevent imminent or life-threatening deterioration of the following conditions:  Sepsis   Critical care was time spent personally by me on the following activities:  Development of treatment plan with patient or surrogate, discussions with consultants, evaluation of patient's response to treatment, examination of patient, ordering and review of laboratory studies, ordering and review of radiographic studies, ordering and performing treatments and interventions, pulse oximetry, re-evaluation of patient's condition and review of old  charts    MEDICATIONS ORDERED IN ED: Medications  lactated ringers infusion (has no administration in time range)  piperacillin-tazobactam (ZOSYN) IVPB 3.375 g (has no administration in time range)  vancomycin (VANCOREADY) IVPB 2000 mg/400 mL (has no administration in time range)  acetaminophen (TYLENOL) tablet 650 mg (650 mg Oral Given 11/20/22 1345)  sodium chloride 0.9 % bolus 1,000 mL (1,000 mLs Intravenous New Bag/Given 11/20/22 1352)     IMPRESSION / MDM / ASSESSMENT AND PLAN / ED COURSE  I reviewed the triage vital signs and the nursing notes.   Patient's presentation is most consistent with acute presentation with potential threat to life or bodily function.   Patient comes in with sepsis criteria with elevated white count and elevated temperature.  Suspect this is related to right leg cellulitis will get ultrasound to make sure no DVT no crepitus felt does not feel like this represent necrotizing fasciitis.  Patient also with a wound on the top of his left foot without any surrounding cellulitis but could be an abscess.  Discussed the case and showed a picture to Dr. Felton Clinton start up with x-rays and then recommended an MRI.  I have held off on significant fluid resuscitation given his vitals are stable and concerned with the swelling in the legs I do not want to worsen this.  His lactate is normal which is reassuring.  CMP shows normal kidney function.  The patient is on the cardiac monitor to evaluate for evidence of arrhythmia and/or significant heart rate changes.      FINAL CLINICAL IMPRESSION(S) / ED DIAGNOSES   Final diagnoses:  Cellulitis of right lower extremity  Wound infection  Sepsis, due to unspecified organism, unspecified whether acute organ dysfunction present [facility])     Rx / DC Orders   ED Discharge Orders     None        Note:  This document was prepared using Dragon voice recognition software and may include unintentional dictation errors.    Concha Se, MD 11/20/22 1500

## 2022-11-20 NOTE — H&P (Addendum)
History and Physical    Alexander Gill ZOX:[id number] DOB: 1955-12-08 DOA: 11/20/2022  Referring MD/NP/PA:   PCP: [facility]   Patient coming from:  The patient is coming from home.     Chief Complaint: left foot ulcers and right lower leg pain  HPI: Alexander Gill is a 67 y.o. 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  Pt states that he has ulcers in left foot, the ulcer in left foot bottom has been going on for more than two weeks, which has been healing up and no drainage. The ulcer in the dorsal side of left foot has been going on for about 1 week, which has been progressively worsening with pus drainage.  Patient does not have pain in the left foot.  He has fever and chills at home.  His body temperature is 101.5 in ED.  He also has right lower leg pain, swelling and erythema which has been going on for about 1 week. The right lower leg pain is mild, aching, constant, nonradiating.  Patient does not have chest pain, cough, shortness of breath.  No nausea, vomiting, diarrhea or abdominal pain.  No symptoms of UTI.  Data reviewed independently and ED Course: pt was found to have WBC 19.4, GFR> 60, potassium 3.4, temperature 101.5, blood pressure 120/62, heart rate 93, RR 20, oxygen saturation 99% on room air.  Patient is admitted to MedSurg bed as inpatient.  Dr. Alberteen Spindle for podiatry is consulted.     Right LE venous doppler: 1. No evidence of right lower extremity DVT. 2. Enlarged right inguinal lymph node.  X-ray of left foot: 1. Significant changes since the prior left foot radiographs. Resorptive changes at the first metatarsophalangeal joint is consistent with osteomyelitis, possibly a septic arthritis. Additional bone resorption of the great toe proximal phalanx and base of  the distal phalanx of the great toe is also suspected to be osteomyelitis. New comminuted fracture across the head of the second metatarsal likely due to underlying osteomyelitis.  X-ray of right foot 1. Significant interval changes since the prior right foot radiographs. 2. Posterior dislocation of the second toe at the second MTP joint. 3. Advanced arthropathic changes now noted at the midfoot. Although this may be due to diabetic neural arthropathy, septic arthropathy should be considered. 4. No other evidence of active osteomyelitis. 5. Significant diffuse soft tissue swelling.   EKG:  Not done in ED, will get one.      Review of Systems:   General: no fevers, chills, no body weight gain, fatigue HEENT: no blurry vision, hearing changes or sore throat Respiratory: no dyspnea, coughing, wheezing CV: no chest pain, no palpitations GI: no nausea, vomiting, abdominal pain, diarrhea, constipation GU: no dysuria, burning on urination, increased urinary frequency, hematuria  Ext: has left  foot ulcers and right lower leg pain Neuro: no unilateral weakness, numbness, or tingling, no vision change or hearing loss Skin: no rash MSK: No muscle spasm, no deformity, no limitation of range of movement in spin Heme: No easy bruising.  Travel history: No recent long distant travel.   Allergy: No Known Allergies  Past Medical History:  Diagnosis Date   Cataract    bilateral eyes   Diabetes mellitus without complication (HCC)    Heart abnormality    Enlarged left ventricle   Hypertension  Past Surgical History:  Procedure Laterality Date   AMPUTATION Right 10/22/2021   Procedure: AMPUTATION RAY-PARTIAL 3RD RAY;  Surgeon: Rosetta Posner, DPM;  Location: [facility];  Service: Podiatry;  Laterality: Right;   AMPUTATION TOE Right 07/05/2021   Procedure: AMPUTATION RIGHT 4TH TOE;  Surgeon: Linus Galas, DPM;  Location: [facility];  Service: Podiatry;  Laterality: Right;   HERNIA REPAIR      INCISION AND DRAINAGE OF WOUND Left 10/22/2021   Procedure: IRRIGATION AND DEBRIDEMENT WOUND;  Surgeon: Rosetta Posner, DPM;  Location: [facility];  Service: Podiatry;  Laterality: Left;   TOE ARTHROPLASTY Left 10/22/2021   Procedure: TOE ARTHROPLASTY;  Surgeon: Rosetta Posner, DPM;  Location: [facility];  Service: Podiatry;  Laterality: Left;    Social History:  reports that he has never smoked. He has never used smokeless tobacco. He reports that he does not currently use alcohol. He reports that he does not currently use drugs.  Family History:  Family History  Problem Relation Age of Onset   Diabetes Mother    Diabetes Father    Parkinson's disease Brother      Prior to Admission medications   Medication Sig Start Date End Date Taking? Authorizing Provider  aspirin EC 81 MG tablet Take 81 mg by mouth daily. Swallow whole.    [provider]  atenolol (TENORMIN) 25 MG tablet Take 25 mg by mouth daily. 12/11/20   [provider]  cholecalciferol (VITAMIN D3) 25 MCG (1000 UNIT) tablet Take 1,000 Units by mouth daily.    [provider]  Dapagliflozin-metFORMIN HCl ER (XIGDUO XR) 11-998 MG TB24 Take 1 tablet by mouth daily at 2 am.    [provider]  Insulin Glargine (BASAGLAR KWIKPEN Leith) Inject 36 Units into the skin daily.    [provider]  levothyroxine (SYNTHROID) 25 MCG tablet Take 25 mcg by mouth daily. 09/27/20   [provider]  rosuvastatin (CRESTOR) 20 MG tablet Take 20 mg by mouth daily. 10/16/20   [provider]  metFORMIN (GLUCOPHAGE) 1000 MG tablet Take 1,000 mg by mouth 2 (two) times daily. 12/10/20 01/17/21  [provider]    Physical Exam: Vitals:   11/20/22 1326 11/20/22 1339  BP:  120/62  Pulse:  93  Resp:  20  Temp:  (!) 101.5 F (38.6 C)  TempSrc:  Oral  SpO2: 94% 99%  Weight:  83.9 kg  Height:  6\' 1"  (1.854 m)   General: Not in acute distress HEENT:       Eyes: PERRL, EOMI, no scleral  icterus.       ENT: No discharge from the ears and nose, no pharynx injection, no tonsillar enlargement.        Neck: No JVD, no bruit, no mass felt. Heme: No neck lymph node enlargement. Cardiac: S1/S2, RRR, No murmurs, No gallops or rubs. Respiratory: No rales, wheezing, rhonchi or rubs. GI: Soft, nondistended, nontender, no rebound pain, no organomegaly, BS present. GU: No hematuria Ext: Has a dorsal left foot ulcer with active pus drainage.  Has a ulcer in left plantar area without active drainage.  The right lower leg and foot are tender, swollen, erythematous and warm            Musculoskeletal: No joint deformities, No joint redness or warmth, no limitation of ROM in spin. Skin: No rashes.  Neuro: Alert, oriented X3, cranial nerves II-XII grossly intact, moves all extremities normally.  Psych: Patient is not psychotic, no suicidal or hemocidal  ideation.  Labs on Admission: I have personally reviewed following labs and imaging studies  CBC: Recent Labs  Lab 11/20/22 1345  WBC 19.4*  NEUTROABS 17.9*  HGB 12.3*  HCT 37.8*  MCV 85.5  PLT 331   Basic Metabolic Panel: Recent Labs  Lab 11/20/22 1345  NA 134*  K 3.4*  CL 100  CO2 23  GLUCOSE 154*  BUN 19  CREATININE 0.90  CALCIUM 8.5*  MG 2.0   GFR: Estimated Creatinine Clearance: 92.5 mL/min (by C-G formula based on SCr of 0.9 mg/dL). Liver Function Tests: Recent Labs  Lab 11/20/22 1345  AST 18  ALT 14  ALKPHOS 105  BILITOT 1.2  PROT 7.7  ALBUMIN 2.6*   No results for input(s): "LIPASE", "AMYLASE" in the last 168 hours. No results for input(s): "AMMONIA" in the last 168 hours. Coagulation Profile: Recent Labs  Lab 11/20/22 1531  INR 1.4*   Cardiac Enzymes: No results for input(s): "CKTOTAL", "CKMB", "CKMBINDEX", "TROPONINI" in the last 168 hours. BNP (last 3 results) No results for input(s): "PROBNP" in the last 8760 hours. HbA1C: No results for input(s): "HGBA1C" in the last 72  hours. CBG: No results for input(s): "GLUCAP" in the last 168 hours. Lipid Profile: No results for input(s): "CHOL", "HDL", "LDLCALC", "TRIG", "CHOLHDL", "LDLDIRECT" in the last 72 hours. Thyroid Function Tests: No results for input(s): "TSH", "T4TOTAL", "FREET4", "T3FREE", "THYROIDAB" in the last 72 hours. Anemia Panel: No results for input(s): "VITAMINB12", "FOLATE", "FERRITIN", "TIBC", "IRON", "RETICCTPCT" in the last 72 hours. Urine analysis: No results found for: "COLORURINE", "APPEARANCEUR", "LABSPEC", "PHURINE", "GLUCOSEU", "HGBUR", "BILIRUBINUR", "KETONESUR", "PROTEINUR", "UROBILINOGEN", "NITRITE", "LEUKOCYTESUR" Sepsis Labs: @LABRCNTIP (procalcitonin:4,lacticidven:4) )No results found for this or any previous visit (from the past 240 hour(s)).   Radiological Exams on Admission: US Venous Img Lower Unilateral Right  Result Date: 11/20/2022 CLINICAL DATA:  Swelling EXAM: RIGHT LOWER EXTREMITY VENOUS DOPPLER ULTRASOUND TECHNIQUE: Gray-scale sonography with compression, as well as color and duplex ultrasound, were performed to evaluate the deep venous system(s) from the level of the common femoral vein through the popliteal and proximal calf veins. COMPARISON:  None Available. FINDINGS: VENOUS Normal compressibility of the common femoral, superficial femoral, and popliteal veins, as well as the visualized calf veins. Visualized portions of profunda femoral vein and great saphenous vein unremarkable. No filling defects to suggest DVT on grayscale or color Doppler imaging. Doppler waveforms show normal direction of venous flow, normal respiratory plasticity and response to augmentation. Limited views of the contralateral common femoral vein are unremarkable. OTHER Enlarged right inguinal lymph node is present measuring 2.1 x 1.1 x 1.7 cm. Limitations: none IMPRESSION: 1. No evidence of right lower extremity DVT. 2. Enlarged right inguinal lymph node. Electronically Signed   By: Darliss Cheney M.D.    On: 11/20/2022 16:03   DG Foot Complete Right  Result Date: 11/20/2022 CLINICAL DATA:  Wounds on both feet.  Fever.  Generalized malaise. EXAM: RIGHT FOOT COMPLETE - 3+ VIEW COMPARISON:  10/22/2021. FINDINGS: Posterior dislocation of the second toe proximal phalanx in relation to the second metatarsal head, new since the prior exam. Resorption of the head of the third metatarsal as progressed since the prior study. Margin is now smooth consistent with the sequela of osteomyelitis. There is also a smooth area of resorption along the lateral fifth metatarsal head. Lucency and sclerosis noted in midfoot with bony fragmentation, new since the prior exam. This is noted involving the navicular medial cuneiform articulations and the medial and lateral cuneiform metatarsal articulations predominantly.  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 Diffuse surrounding soft tissue edema.  No soft tissue air. IMPRESSION: 1. Significant interval changes since the prior right foot radiographs. 2. Posterior dislocation of the second toe at the second MTP joint. 3. Advanced arthropathic changes now noted at the midfoot. Although this may be due to diabetic neural arthropathy, septic arthropathy should be considered. 4. No other evidence of active osteomyelitis. 5. Significant diffuse soft tissue swelling. Electronically Signed   By: Amie Portland M.D.   On: 11/20/2022 15:24   DG Foot Complete Left  Result Date: 11/20/2022 CLINICAL DATA:  Wounds to both feet. Feet swelling. Fever. Generalized malaise. EXAM: LEFT FOOT - COMPLETE 3+ VIEW COMPARISON:  10/22/2021 and older exams. FINDINGS: There is significant resorption of the first metatarsal head with a concave deformity of the base of the great toe proximal phalanx. These findings are new since the prior study. There is also been resorption of the distal shaft and head of the proximal phalanx of the great toe with mild resorption at the base of the great toe distal  phalanx. There is a mildly comminuted fracture across the base of the second metatarsal head, also new. There is been interval fusion of the PIP joint of the second toe. Chronic appearing resorption is noted of the distal phalanx of the third toe. There is a smooth area of resorption/remottling along the lateral head of the third metatarsal. There is significant forefoot soft tissue swelling. No soft tissue air. IMPRESSION: 1. Significant changes since the prior left foot radiographs. Resorptive changes at the first metatarsophalangeal joint is consistent with osteomyelitis, possibly a septic arthritis. Additional bone resorption of the great toe proximal phalanx and base of the distal phalanx of the great toe is also suspected to be osteomyelitis. New comminuted fracture across the head of the second metatarsal likely due to underlying osteomyelitis. Electronically Signed   By: Amie Portland M.D.   On: 11/20/2022 15:20      Assessment/Plan Principal Problem:   Osteomyelitis of left foot (HCC) Active Problems:   Cellulitis of right lower extremity   Sepsis (HCC)   Type 2 diabetes mellitus with diabetic foot ulcer (HCC)   Hypertension   HLD (hyperlipidemia)   Hypothyroidism   Hypokalemia   Assessment and Plan:  Sepsis due to left foot osteomyelitis and cellulitis of right lower extremity: pt meets criteria for sepsis with WBC  19.4, fever of 101/5, HR 93. Lactic acid normal at 1.3. consulted Dr. Alberteen Spindle of podiatry.  -admit to med-surg bed as in pt - Empiric antimicrobial treatment with vancomycin, Flagyl, Rocephin (patient received 1 dose of Zosyn in ED) - PRN Zofran for nausea, tylenol and oxycodone for pain - Blood cultures x 2  - ESR and CRP - MRI-left foot per Dr. Alberteen Spindle - will get Procalcitonin - IVF: 2.0 L of NS bolus in ED, followed by 75 cc/h of LR - INR/PTT - right LE doppler to r/o DVT --> negative for DVT - NPO after MN  Type 2 diabetes mellitus with diabetic foot ulcer  (HCC): Recent A1c 8.2, poorly controlled.  Patient is taking Xigduo XR, semaglutide and glargine insulin 36 units daily -Glargine insulin 24 Units daily  -SSI  Hypertension: -Hold home blood pressure medications: Atenolol, Hyzaar since patient at risk of developing hypotension due to sepsis -IV hydralazine as needed  HLD (hyperlipidemia) -Crestor  Hypothyroidism -Synthroid  Hypokalemia: Potassium 3.4, magnesium 2.0 -Repleted potassium    DVT ppx: SQ Heparin   Code  Status: Full code  Family Communication:  Yes, patient's wife and daughter  at bed side.    Disposition Plan:  Anticipate discharge back to previous environment  Consults called:  Dr. Alberteen Spindle for podiatry is consulted.     Admission status and Level of care: Med-Surg:   as inpt      Dispo: The patient is from: Home              Anticipated d/c is to: Home              Anticipated d/c date is: 2 days              Patient currently is not medically stable to d/c.    Severity of Illness:  The appropriate patient status for this patient is INPATIENT. Inpatient status is judged to be reasonable and necessary in order to provide the required intensity of service to ensure the patient's safety. The patient's presenting symptoms, physical exam findings, and initial radiographic and laboratory data in the context of their chronic comorbidities is felt to place them at high risk for further clinical deterioration. Furthermore, it is not anticipated that the patient will be medically stable for discharge from the hospital within 2 midnights of admission.   * I certify that at the point of admission it is my clinical judgment that the patient will require inpatient hospital care spanning beyond 2 midnights from the point of admission due to high intensity of service, high risk for further deterioration and high frequency of surveillance required.*       Date of Service 11/20/2022    Lorretta Harp [facility]   If  7PM-7AM, please contact night-coverage [email or web address] 11/20/2022, 4:36 PM

## 2022-11-20 NOTE — ED Triage Notes (Signed)
Pt to ER states he has wounds on both feet.  States today noted fever, general malaise and not feeling well.  Pt is not on abx.  Attempted to see podiatrist ans was told it would be 10 days before 1st appointment available.

## 2022-11-20 NOTE — Consult Note (Signed)
Pharmacy Antibiotic Note  Alexander Gill is a 67 y.o. male admitted on 11/20/2022 with  leg cellulitis and diabetic foot infection .  Pharmacy has been consulted for Vancomycin dosing.  Plan: Vancomycin 2000 mg IV x 1 as loading dose, followed by 1250 mg Q 12 hrs. Goal AUC 400-550. Expected AUC: 509.9 Expected Cmin: 14.2 SCr used: 0.9   Height: 6\' 1"  (185.4 cm) Weight: 83.9 kg (185 lb) IBW/kg (Calculated) : 79.9  Temp (24hrs), Avg:101.5 F (38.6 C), Min:101.5 F (38.6 C), Max:101.5 F (38.6 C)  Recent Labs  Lab 11/20/22 1345  WBC 19.4*  CREATININE 0.90  LATICACIDVEN 1.3    Estimated Creatinine Clearance: 92.5 mL/min (by C-G formula based on SCr of 0.9 mg/dL).    No Known Allergies  Antimicrobials this admission: Vancomycin 5/3 >>  Zosyn 5/3 >>   Dose adjustments this admission: N/A  Microbiology results: 5/3 BCx: collected   Thank you for allowing pharmacy to be a part of this patient's care.  Alexander Gill A Arael Piccione 11/20/2022 4:12 PM

## 2022-11-20 NOTE — Consult Note (Signed)
CODE SEPSIS - PHARMACY COMMUNICATION  **Broad Spectrum Antibiotics should be administered within 1 hour of Sepsis diagnosis**  Time Code Sepsis Called/Page Received: 1432  Antibiotics Ordered: vancomycin, zosyn  Time of 1st antibiotic administration: 1434  Additional action taken by pharmacy: none  If necessary, Name of Provider/Nurse Contacted: n/a    Bettey Costa ,PharmD Clinical Pharmacist  11/20/2022  2:35 PM

## 2022-11-20 NOTE — ED Notes (Signed)
First Nurse Note: Pt to ED via [facility] from home for generalized weakness and not feeling well. Pt also is having decreased appetite. EMS reports pt has temp of 101.5. Pt is currently in NAD.

## 2022-11-20 NOTE — Consult Note (Signed)
PHARMACY -  BRIEF ANTIBIOTIC NOTE   Pharmacy has received consult(s) for Vancomycin from an ED provider.  The patient's profile has been reviewed for ht/wt/allergies/indication/available labs.    One time order(s) placed for Vancomycin 2g IV x 1 dose.  Further antibiotics/pharmacy consults should be ordered by admitting physician if indicated.                       Thank you, Bettey Costa 11/20/2022  2:34 PM

## 2022-11-20 NOTE — Sepsis Progress Note (Signed)
[facility] will follow per sepsis protocol  

## 2022-11-21 ENCOUNTER — Encounter: Payer: Self-pay | Admitting: Internal Medicine

## 2022-11-21 DIAGNOSIS — E876 Hypokalemia: Secondary | ICD-10-CM

## 2022-11-21 DIAGNOSIS — M86172 Other acute osteomyelitis, left ankle and foot: Secondary | ICD-10-CM

## 2022-11-21 DIAGNOSIS — A419 Sepsis, unspecified organism: Secondary | ICD-10-CM

## 2022-11-21 DIAGNOSIS — L97509 Non-pressure chronic ulcer of other part of unspecified foot with unspecified severity: Secondary | ICD-10-CM

## 2022-11-21 DIAGNOSIS — E11621 Type 2 diabetes mellitus with foot ulcer: Secondary | ICD-10-CM

## 2022-11-21 DIAGNOSIS — L03115 Cellulitis of right lower limb: Secondary | ICD-10-CM | POA: Diagnosis not present

## 2022-11-21 LAB — BASIC METABOLIC PANEL
Anion gap: 8 (ref 5–15)
BUN: 20 mg/dL (ref 8–23)
CO2: 22 mmol/L (ref 22–32)
Calcium: 7.8 mg/dL — ABNORMAL LOW (ref 8.9–10.3)
Chloride: 108 mmol/L (ref 98–111)
Creatinine, Ser: 0.92 mg/dL (ref 0.61–1.24)
GFR, Estimated: >60 mL/min (ref >60)
Glucose, Bld: 131 mg/dL — ABNORMAL HIGH (ref 70–99)
Potassium: 3.3 mmol/L — ABNORMAL LOW (ref 3.5–5.1)
Sodium: 138 mmol/L (ref 135–145)

## 2022-11-21 LAB — BLOOD CULTURE ID PANEL (REFLEXED) - BCID2

## 2022-11-21 LAB — CBC
HCT: 32.8 % — ABNORMAL LOW (ref 39.0–52.0)
Hemoglobin: 10.5 g/dL — ABNORMAL LOW (ref 13.0–17.0)
MCH: 28 pg (ref 26.0–34.0)
MCHC: 32 g/dL (ref 30.0–36.0)
MCV: 87.5 fL (ref 80.0–100.0)
Platelets: 348 10*3/uL (ref 150–400)
RBC: 3.75 MIL/uL — ABNORMAL LOW (ref 4.22–5.81)
RDW: 14.8 % (ref 11.5–15.5)
WBC: 13.1 10*3/uL — ABNORMAL HIGH (ref 4.0–10.5)
nRBC: 0 % (ref 0.0–0.2)

## 2022-11-21 LAB — CBG MONITORING, ED: Glucose-Capillary: 292 mg/dL — ABNORMAL HIGH (ref 70–99)

## 2022-11-21 LAB — AEROBIC/ANAEROBIC CULTURE W GRAM STAIN (SURGICAL/DEEP WOUND)

## 2022-11-21 LAB — GLUCOSE, CAPILLARY
Glucose-Capillary: 260 mg/dL — ABNORMAL HIGH (ref 70–99)
Glucose-Capillary: 230 mg/dL — ABNORMAL HIGH (ref 70–99)
Glucose-Capillary: 231 mg/dL — ABNORMAL HIGH (ref 70–99)

## 2022-11-21 MED ORDER — CEFAZOLIN SODIUM-DEXTROSE 2-4 GM/100ML-% IV SOLN
2.0000 g | Freq: Three times a day (TID) | INTRAVENOUS | Status: DC
  Administered 2022-11-21 – 2022-11-27 (×18): 2 g via INTRAVENOUS
  Filled 2022-11-21 (×20): qty 100

## 2022-11-21 MED ORDER — PROPOFOL 10 MG/ML IV BOLUS
INTRAVENOUS | Status: AC
  Filled 2022-11-21: qty 20

## 2022-11-21 MED ORDER — FENTANYL CITRATE (PF) 100 MCG/2ML IJ SOLN
INTRAMUSCULAR | Status: AC
  Filled 2022-11-21: qty 2

## 2022-11-21 MED ORDER — MIDAZOLAM HCL 2 MG/2ML IJ SOLN
INTRAMUSCULAR | Status: AC
  Filled 2022-11-21: qty 2

## 2022-11-21 NOTE — Progress Notes (Addendum)
Day of Surgery   Subjective/Chief Complaint: Patient seen.  States he is thirsty and has a headache.   Objective: Vital signs in last 24 hours: Temp:  [98.2 F (36.8 C)-101.5 F (38.6 C)] 98.2 F (36.8 C) (05/04 0610) Pulse Rate:  [62-93] 71 (05/04 0610) Resp:  [16-22] 16 (05/04 0610) BP: (108-123)/(56-72) 123/72 (05/04 0610) SpO2:  [94 %-99 %] 95 % (05/04 0610) Weight:  [83.9 kg] 83.9 kg (05/03 1339)    Intake/Output from previous day: 05/03 0701 - 05/04 0700 In: 1200 [IV Piggyback:1200] Out: -  Intake/Output this shift: No intake/output data recorded.  Wound and dressing left intact this morning.  Lab Results:  Recent Labs    11/20/22 1345 11/21/22 0609  WBC 19.4* 13.1*  HGB 12.3* 10.5*  HCT 37.8* 32.8*  PLT 331 348   BMET Recent Labs    11/20/22 1345 11/21/22 0609  NA 134* 138  K 3.4* 3.3*  CL 100 108  CO2 23 22  GLUCOSE 154* 131*  BUN 19 20  CREATININE 0.90 0.92  CALCIUM 8.5* 7.8*   PT/INR Recent Labs    11/20/22 1531  LABPROT 16.9*  INR 1.4*   ABG No results for input(s): "PHART", "HCO3" in the last 72 hours.  Invalid input(s): "PCO2", "PO2"  Studies/Results: MR FOOT LEFT WO CONTRAST  Result Date: 11/21/2022 CLINICAL DATA:  Foot swelling, diabetic, osteomyelitis suspected. EXAM: MRI OF THE LEFT FOOT WITHOUT CONTRAST TECHNIQUE: Multiplanar, multisequence MR imaging of the left forefoot was performed. No intravenous contrast was administered. COMPARISON:  Radiographs dated Nov 20, 2022 FINDINGS: Bones/Joint/Cartilage Osseous erosion at the first metatarsophalangeal joint with bone marrow edema of the distal half of the first metatarsal and residual proximal phalanx. There is also edema and erosion of the distal phalanx of the first digit. There is bone marrow edema and osseous erosion at the second metatarsal head and proximal phalanx of the second digit suggesting pathological fracture with joint effusion. There is also marrow edema of the proximal  phalanx of the third digit which in the presence of adjacent infectious/inflammatory process is consistent with osteomyelitis. Ligaments Evaluation of ligaments at the first and second metatarsophalangeal joints is limited secondary to ongoing chronic infectious/inflammatory process. Muscles and Tendons Flexor and extensor tendons appear intact. Increased signal of the plantar muscles suggesting myopathy/myositis. Soft tissues There is a fluid collection about the second metatarsophalangeal joint measuring at least 2.3 x 1.3 x 3.1 cm with an open wound about dorsal aspect of the foot and marked surrounding inflammatory changes consistent with an abscess. There is also deep skin wound and edema and inflammatory changes about the first metatarsophalangeal joint suggesting chronic infectious/inflammatory process/abscess IMPRESSION: 1. Osteomyelitis of the distal half of the first metatarsal and residual proximal phalanx of the first digit, likely secondary to ongoing osteomyelitis. 2. Abnormal marrow signal of the second metatarsal head and proximal phalanx of the second digit with fracture of the second metatarsal head, likely sequela of osteomyelitis with septic arthritis. 3. Osteomyelitis of the proximal phalanx of the third digit. 4. 2.3 x 1.3 x 3.1 cm fluid collection about the second metatarsophalangeal joint with open wound about the dorsal aspect of the foot marked surrounding inflammatory changes consistent with abscess. 5. Deep skin wound and edema and inflammatory changes about the first metatarsophalangeal joint suggesting chronic infectious/inflammatory process/abscess. Electronically Signed   By: Imran  Ahmed D.O.   On: 11/21/2022 09:39   US Venous Img Lower Unilateral Right  Result Date: 11/20/2022 CLINICAL DATA:  Swelling EXAM:   RIGHT LOWER EXTREMITY VENOUS DOPPLER ULTRASOUND TECHNIQUE: Gray-scale sonography with compression, as well as color and duplex ultrasound, were performed to evaluate the deep  venous system(s) from the level of the common femoral vein through the popliteal and proximal calf veins. COMPARISON:  None Available. FINDINGS: VENOUS Normal compressibility of the common femoral, superficial femoral, and popliteal veins, as well as the visualized calf veins. Visualized portions of profunda femoral vein and great saphenous vein unremarkable. No filling defects to suggest DVT on grayscale or color Doppler imaging. Doppler waveforms show normal direction of venous flow, normal respiratory plasticity and response to augmentation. Limited views of the contralateral common femoral vein are unremarkable. OTHER Enlarged right inguinal lymph node is present measuring 2.1 x 1.1 x 1.7 cm. Limitations: none IMPRESSION: 1. No evidence of right lower extremity DVT. 2. Enlarged right inguinal lymph node. Electronically Signed   By: Amy  Guttmann M.D.   On: 11/20/2022 16:03   DG Foot Complete Right  Result Date: 11/20/2022 CLINICAL DATA:  Wounds on both feet.  Fever.  Generalized malaise. EXAM: RIGHT FOOT COMPLETE - 3+ VIEW COMPARISON:  10/22/2021. FINDINGS: Posterior dislocation of the second toe proximal phalanx in relation to the second metatarsal head, new since the prior exam. Resorption of the head of the third metatarsal as progressed since the prior study. Margin is now smooth consistent with the sequela of osteomyelitis. There is also a smooth area of resorption along the lateral fifth metatarsal head. Lucency and sclerosis noted in midfoot with bony fragmentation, new since the prior exam. This is noted involving the navicular medial cuneiform articulations and the medial and lateral cuneiform metatarsal articulations predominantly. 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 Diffuse surrounding soft tissue edema.  No soft tissue air. IMPRESSION: 1. Significant interval changes since the prior right foot radiographs. 2. Posterior dislocation of the second toe at the second MTP joint. 3.  Advanced arthropathic changes now noted at the midfoot. Although this may be due to diabetic neural arthropathy, septic arthropathy should be considered. 4. No other evidence of active osteomyelitis. 5. Significant diffuse soft tissue swelling. Electronically Signed   By: David  Ormond M.D.   On: 11/20/2022 15:24   DG Foot Complete Left  Result Date: 11/20/2022 CLINICAL DATA:  Wounds to both feet. Feet swelling. Fever. Generalized malaise. EXAM: LEFT FOOT - COMPLETE 3+ VIEW COMPARISON:  10/22/2021 and older exams. FINDINGS: There is significant resorption of the first metatarsal head with a concave deformity of the base of the great toe proximal phalanx. These findings are new since the prior study. There is also been resorption of the distal shaft and head of the proximal phalanx of the great toe with mild resorption at the base of the great toe distal phalanx. There is a mildly comminuted fracture across the base of the second metatarsal head, also new. There is been interval fusion of the PIP joint of the second toe. Chronic appearing resorption is noted of the distal phalanx of the third toe. There is a smooth area of resorption/remottling along the lateral head of the third metatarsal. There is significant forefoot soft tissue swelling. No soft tissue air. IMPRESSION: 1. Significant changes since the prior left foot radiographs. Resorptive changes at the first metatarsophalangeal joint is consistent with osteomyelitis, possibly a septic arthritis. Additional bone resorption of the great toe proximal phalanx and base of the distal phalanx of the great toe is also suspected to be osteomyelitis. New comminuted fracture across the head of the second metatarsal   likely due to underlying osteomyelitis. Electronically Signed   By: David  Ormond M.D.   On: 11/20/2022 15:20    Anti-infectives: Anti-infectives (From admission, onward)    Start     Dose/Rate Route Frequency Ordered Stop   11/20/22 2200   cefTRIAXone (ROCEPHIN) 2 g in sodium chloride 0.9 % 100 mL IVPB        2 g 200 mL/hr over 30 Minutes Intravenous Every 24 hours 11/20/22 1509     11/20/22 2200  metroNIDAZOLE (FLAGYL) IVPB 500 mg        500 mg 100 mL/hr over 60 Minutes Intravenous Every 12 hours 11/20/22 1509     11/20/22 1445  vancomycin (VANCOCIN) IVPB 1000 mg/200 mL premix  Status:  Discontinued        1,000 mg 200 mL/hr over 60 Minutes Intravenous  Once 11/20/22 1432 11/20/22 1434   11/20/22 1445  piperacillin-tazobactam (ZOSYN) IVPB 3.375 g        3.375 g 100 mL/hr over 30 Minutes Intravenous  Once 11/20/22 1432 11/20/22 1548   11/20/22 1445  vancomycin (VANCOREADY) IVPB 2000 mg/400 mL        2,000 mg 200 mL/hr over 120 Minutes Intravenous  Once 11/20/22 1434 11/20/22 1728       Assessment/Plan: s/p Procedure(s): AMPUTATION RAY (Left) Assessment: 1.  Osteomyelitis left foot with abscess.  2.  Charcot arthropathy right foot. 3.  Diabetes with significant neuropathy.  Discussed with the patient that with a power outage this morning that surgery will have to be postponed until tomorrow for resterilization of instruments.  Discussed with the nurse that they can remove his n.p.o. order and he can go ahead and eat today.  Plan for n.p.o. after midnight tonight.  Hopefully we will be able to proceed with surgery tomorrow.  Discussed with the patient that after reviewing his MRI that the entire first and second metatarsals are involved with osteomyelitis and that we will most likely need to disarticulate his entire forefoot at the Lisfranc joint.  Discussed with the patient that with the operating room going down that this may take more than a day or 2 to be able to perform this.  Offered the patient consideration for transfer to [place] but patient states that he wishes to stay here.  Continue to follow closely.  LOS: 1 day    Dashon Mcintire W Graylyn Bunney 11/21/2022  

## 2022-11-21 NOTE — Progress Notes (Signed)
PHARMACY - PHYSICIAN COMMUNICATION CRITICAL VALUE ALERT - BLOOD CULTURE IDENTIFICATION (BCID)  Results for orders placed or performed during the hospital encounter of 11/20/22  Blood culture (routine x 2)     Status: None (Preliminary result)   Collection Time: 11/20/22  2:24 PM   Specimen: BLOOD  Result Value Ref Range Status   Specimen Description BLOOD BLOOD RIGHT FOREARM  Final   Special Requests   Final    BOTTLES DRAWN AEROBIC AND ANAEROBIC Blood Culture results may not be optimal due to an excessive volume of blood received in culture bottles   Culture   Final    NO GROWTH < 24 HOURS Performed at [facility], [address] [place], [place] [address]    Report Status PENDING  Incomplete  Blood culture (routine x 2)     Status: None (Preliminary result)   Collection Time: 11/20/22  2:49 PM   Specimen: BLOOD  Result Value Ref Range Status   Specimen Description BLOOD BLOOD LEFT FOREARM  Final   Special Requests   Final    BOTTLES DRAWN AEROBIC AND ANAEROBIC Blood Culture results may not be optimal due to an excessive volume of blood received in culture bottles   Culture  Setup Time   Final    Organism ID to follow GRAM POSITIVE COCCI AEROBIC BOTTLE ONLY CRITICAL RESULT CALLED TO, READ BACK BY AND VERIFIED WITHDawayne Cirri @ [phone] 11/21/22 [facility] Performed at [facility], [address] [place], [place] [address]    Culture GRAM POSITIVE COCCI  Final   Report Status PENDING  Incomplete  Blood Culture ID Panel (Reflexed)     Status: Abnormal   Collection Time: 11/20/22  2:49 PM  Result Value Ref Range Status   Enterococcus faecalis NOT DETECTED NOT DETECTED Final   Enterococcus Faecium NOT DETECTED NOT DETECTED Final   Listeria monocytogenes NOT DETECTED NOT DETECTED Final   Staphylococcus species DETECTED (A) NOT DETECTED Final    Comment: CRITICAL RESULT CALLED TO, READ BACK BY AND VERIFIED WITH: Arlynn Mcdermid @ 0440 11/21/22 [facility]    Staphylococcus  aureus (BCID) DETECTED (A) NOT DETECTED Final    Comment: CRITICAL RESULT CALLED TO, READ BACK BY AND VERIFIED WITH: Jewel Mcafee @ 0440 11/21/22 [facility]    Staphylococcus epidermidis NOT DETECTED NOT DETECTED Final   Staphylococcus lugdunensis NOT DETECTED NOT DETECTED Final   Streptococcus species NOT DETECTED NOT DETECTED Final   Streptococcus agalactiae NOT DETECTED NOT DETECTED Final   Streptococcus pneumoniae NOT DETECTED NOT DETECTED Final   Streptococcus pyogenes NOT DETECTED NOT DETECTED Final   A.calcoaceticus-baumannii NOT DETECTED NOT DETECTED Final   Bacteroides fragilis NOT DETECTED NOT DETECTED Final   Enterobacterales NOT DETECTED NOT DETECTED Final   Enterobacter cloacae complex NOT DETECTED NOT DETECTED Final   Escherichia coli NOT DETECTED NOT DETECTED Final   Klebsiella aerogenes NOT DETECTED NOT DETECTED Final   Klebsiella oxytoca NOT DETECTED NOT DETECTED Final   Klebsiella pneumoniae NOT DETECTED NOT DETECTED Final   Proteus species NOT DETECTED NOT DETECTED Final   Salmonella species NOT DETECTED NOT DETECTED Final   Serratia marcescens NOT DETECTED NOT DETECTED Final   Haemophilus influenzae NOT DETECTED NOT DETECTED Final   Neisseria meningitidis NOT DETECTED NOT DETECTED Final   Pseudomonas aeruginosa NOT DETECTED NOT DETECTED Final   Stenotrophomonas maltophilia NOT DETECTED NOT DETECTED Final   Candida albicans NOT DETECTED NOT DETECTED Final   Candida auris NOT DETECTED NOT DETECTED Final   Candida glabrata  NOT DETECTED NOT DETECTED Final   Candida krusei NOT DETECTED NOT DETECTED Final   Candida parapsilosis NOT DETECTED NOT DETECTED Final   Candida tropicalis NOT DETECTED NOT DETECTED Final   Cryptococcus neoformans/gattii NOT DETECTED NOT DETECTED Final   Meth resistant mecA/C and MREJ NOT DETECTED NOT DETECTED Final    Comment: Performed at [facility], [address] [place], [place] [address]    BCID Results: 1 (aerobic) bottle of 4 w/  Staph Aureus, no resistance.  Pt currently on Ceftriaxone & Flagyl.  Pt has amputation procedure scheduled for later today.  Name of provider contacted: Cliffton Asters, NP   Changes to prescribed antibiotics required: No changes at this time.  Otelia Sergeant, PharmD, [facility] 11/21/2022 4:42 AM

## 2022-11-21 NOTE — H&P (View-Only) (Signed)
Day of Surgery   Subjective/Chief Complaint: Patient seen.  States he is thirsty and has a headache.   Objective: Vital signs in last 24 hours: Temp:  [98.2 F (36.8 C)-101.5 F (38.6 C)] 98.2 F (36.8 C) (05/04 0610) Pulse Rate:  [62-93] 71 (05/04 0610) Resp:  [16-22] 16 (05/04 0610) BP: (108-123)/(56-72) 123/72 (05/04 0610) SpO2:  [94 %-99 %] 95 % (05/04 0610) Weight:  [83.9 kg] 83.9 kg (05/03 1339)    Intake/Output from previous day: 05/03 0701 - 05/04 0700 In: 1200 [IV Piggyback:1200] Out: -  Intake/Output this shift: No intake/output data recorded.  Wound and dressing left intact this morning.  Lab Results:  Recent Labs    11/20/22 1345 11/21/22 0609  WBC 19.4* 13.1*  HGB 12.3* 10.5*  HCT 37.8* 32.8*  PLT 331 348   BMET Recent Labs    11/20/22 1345 11/21/22 0609  NA 134* 138  K 3.4* 3.3*  CL 100 108  CO2 23 22  GLUCOSE 154* 131*  BUN 19 20  CREATININE 0.90 0.92  CALCIUM 8.5* 7.8*   PT/INR Recent Labs    11/20/22 1531  LABPROT 16.9*  INR 1.4*   ABG No results for input(s): "PHART", "HCO3" in the last 72 hours.  Invalid input(s): "PCO2", "PO2"  Studies/Results: MR FOOT LEFT WO CONTRAST  Result Date: 11/21/2022 CLINICAL DATA:  Foot swelling, diabetic, osteomyelitis suspected. EXAM: MRI OF THE LEFT FOOT WITHOUT CONTRAST TECHNIQUE: Multiplanar, multisequence MR imaging of the left forefoot was performed. No intravenous contrast was administered. COMPARISON:  Radiographs dated Nov 20, 2022 FINDINGS: Bones/Joint/Cartilage Osseous erosion at the first metatarsophalangeal joint with bone marrow edema of the distal half of the first metatarsal and residual proximal phalanx. There is also edema and erosion of the distal phalanx of the first digit. There is bone marrow edema and osseous erosion at the second metatarsal head and proximal phalanx of the second digit suggesting pathological fracture with joint effusion. There is also marrow edema of the proximal  phalanx of the third digit which in the presence of adjacent infectious/inflammatory process is consistent with osteomyelitis. Ligaments Evaluation of ligaments at the first and second metatarsophalangeal joints is limited secondary to ongoing chronic infectious/inflammatory process. Muscles and Tendons Flexor and extensor tendons appear intact. Increased signal of the plantar muscles suggesting myopathy/myositis. Soft tissues There is a fluid collection about the second metatarsophalangeal joint measuring at least 2.3 x 1.3 x 3.1 cm with an open wound about dorsal aspect of the foot and marked surrounding inflammatory changes consistent with an abscess. There is also deep skin wound and edema and inflammatory changes about the first metatarsophalangeal joint suggesting chronic infectious/inflammatory process/abscess IMPRESSION: 1. Osteomyelitis of the distal half of the first metatarsal and residual proximal phalanx of the first digit, likely secondary to ongoing osteomyelitis. 2. Abnormal marrow signal of the second metatarsal head and proximal phalanx of the second digit with fracture of the second metatarsal head, likely sequela of osteomyelitis with septic arthritis. 3. Osteomyelitis of the proximal phalanx of the third digit. 4. 2.3 x 1.3 x 3.1 cm fluid collection about the second metatarsophalangeal joint with open wound about the dorsal aspect of the foot marked surrounding inflammatory changes consistent with abscess. 5. Deep skin wound and edema and inflammatory changes about the first metatarsophalangeal joint suggesting chronic infectious/inflammatory process/abscess. Electronically Signed   By: Larose Hires D.O.   On: 11/21/2022 09:39   US Venous Img Lower Unilateral Right  Result Date: 11/20/2022 CLINICAL DATA:  Swelling EXAM:  RIGHT LOWER EXTREMITY VENOUS DOPPLER ULTRASOUND TECHNIQUE: Gray-scale sonography with compression, as well as color and duplex ultrasound, were performed to evaluate the deep  venous system(s) from the level of the common femoral vein through the popliteal and proximal calf veins. COMPARISON:  None Available. FINDINGS: VENOUS Normal compressibility of the common femoral, superficial femoral, and popliteal veins, as well as the visualized calf veins. Visualized portions of profunda femoral vein and great saphenous vein unremarkable. No filling defects to suggest DVT on grayscale or color Doppler imaging. Doppler waveforms show normal direction of venous flow, normal respiratory plasticity and response to augmentation. Limited views of the contralateral common femoral vein are unremarkable. OTHER Enlarged right inguinal lymph node is present measuring 2.1 x 1.1 x 1.7 cm. Limitations: none IMPRESSION: 1. No evidence of right lower extremity DVT. 2. Enlarged right inguinal lymph node. Electronically Signed   By: Darliss Cheney M.D.   On: 11/20/2022 16:03   DG Foot Complete Right  Result Date: 11/20/2022 CLINICAL DATA:  Wounds on both feet.  Fever.  Generalized malaise. EXAM: RIGHT FOOT COMPLETE - 3+ VIEW COMPARISON:  10/22/2021. FINDINGS: Posterior dislocation of the second toe proximal phalanx in relation to the second metatarsal head, new since the prior exam. Resorption of the head of the third metatarsal as progressed since the prior study. Margin is now smooth consistent with the sequela of osteomyelitis. There is also a smooth area of resorption along the lateral fifth metatarsal head. Lucency and sclerosis noted in midfoot with bony fragmentation, new since the prior exam. This is noted involving the navicular medial cuneiform articulations and the medial and lateral cuneiform metatarsal articulations predominantly. 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 Diffuse surrounding soft tissue edema.  No soft tissue air. IMPRESSION: 1. Significant interval changes since the prior right foot radiographs. 2. Posterior dislocation of the second toe at the second MTP joint. 3.  Advanced arthropathic changes now noted at the midfoot. Although this may be due to diabetic neural arthropathy, septic arthropathy should be considered. 4. No other evidence of active osteomyelitis. 5. Significant diffuse soft tissue swelling. Electronically Signed   By: Amie Portland M.D.   On: 11/20/2022 15:24   DG Foot Complete Left  Result Date: 11/20/2022 CLINICAL DATA:  Wounds to both feet. Feet swelling. Fever. Generalized malaise. EXAM: LEFT FOOT - COMPLETE 3+ VIEW COMPARISON:  10/22/2021 and older exams. FINDINGS: There is significant resorption of the first metatarsal head with a concave deformity of the base of the great toe proximal phalanx. These findings are new since the prior study. There is also been resorption of the distal shaft and head of the proximal phalanx of the great toe with mild resorption at the base of the great toe distal phalanx. There is a mildly comminuted fracture across the base of the second metatarsal head, also new. There is been interval fusion of the PIP joint of the second toe. Chronic appearing resorption is noted of the distal phalanx of the third toe. There is a smooth area of resorption/remottling along the lateral head of the third metatarsal. There is significant forefoot soft tissue swelling. No soft tissue air. IMPRESSION: 1. Significant changes since the prior left foot radiographs. Resorptive changes at the first metatarsophalangeal joint is consistent with osteomyelitis, possibly a septic arthritis. Additional bone resorption of the great toe proximal phalanx and base of the distal phalanx of the great toe is also suspected to be osteomyelitis. New comminuted fracture across the head of the second metatarsal  likely due to underlying osteomyelitis. Electronically Signed   By: Amie Portland M.D.   On: 11/20/2022 15:20    Anti-infectives: Anti-infectives (From admission, onward)    Start     Dose/Rate Route Frequency Ordered Stop   11/20/22 2200   cefTRIAXone (ROCEPHIN) 2 g in sodium chloride 0.9 % 100 mL IVPB        2 g 200 mL/hr over 30 Minutes Intravenous Every 24 hours 11/20/22 1509     11/20/22 2200  metroNIDAZOLE (FLAGYL) IVPB 500 mg        500 mg 100 mL/hr over 60 Minutes Intravenous Every 12 hours 11/20/22 1509     11/20/22 1445  vancomycin (VANCOCIN) IVPB 1000 mg/200 mL premix  Status:  Discontinued        1,000 mg 200 mL/hr over 60 Minutes Intravenous  Once 11/20/22 1432 11/20/22 1434   11/20/22 1445  piperacillin-tazobactam (ZOSYN) IVPB 3.375 g        3.375 g 100 mL/hr over 30 Minutes Intravenous  Once 11/20/22 1432 11/20/22 1548   11/20/22 1445  vancomycin (VANCOREADY) IVPB 2000 mg/400 mL        2,000 mg 200 mL/hr over 120 Minutes Intravenous  Once 11/20/22 1434 11/20/22 1728       Assessment/Plan: s/p Procedure(s): AMPUTATION RAY (Left) Assessment: 1.  Osteomyelitis left foot with abscess.  2.  Charcot arthropathy right foot. 3.  Diabetes with significant neuropathy.  Discussed with the patient that with a power outage this morning that surgery will have to be postponed until tomorrow for resterilization of instruments.  Discussed with the nurse that they can remove his n.p.o. order and he can go ahead and eat today.  Plan for n.p.o. after midnight tonight.  Hopefully we will be able to proceed with surgery tomorrow.  Discussed with the patient that after reviewing his MRI that the entire first and second metatarsals are involved with osteomyelitis and that we will most likely need to disarticulate his entire forefoot at the Lisfranc joint.  Discussed with the patient that with the operating room going down that this may take more than a day or 2 to be able to perform this.  Offered the patient consideration for transfer to [facility] but patient states that he wishes to stay here.  Continue to follow closely.  LOS: 1 day    Ricci Barker 11/21/2022

## 2022-11-21 NOTE — Hospital Course (Addendum)
Alexander Gill is a 67 y.o. 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  05/03:  WBC 19.4, GFR> 60, potassium 3.4, temperature 101.5, blood pressure 120/62, heart rate 93, RR 20, oxygen saturation 99% on room air.  Patient is admitted to MedSurg for psteomyelitis, cellulitis w/ abscess L foot.  Dr. Alberteen Spindle for podiatry was consulted. L foot abscess cultured. Plan surgery tomorrow for amputation of the second ray with potentially the first ray as well.  05/04: surgery was on schedule but had to be deferred d/t power outage in the hospital. Pt stable. WBC trending down. (+)BCx gram pos cocci - ID panel (+)staph --> abx narrowed to cefazolin  05/05: Underwent Transmetatarsal amputation left foot w/ Dr Alberteen Spindle. Repeat BCx drawn. Plan ID consult tomorrow to guide further IV abx, awaiting surgical culture/pathology. Pt is NWB, will need PT and possible SNF/STR. 05/06: awaiting susceptibilities from initial cultures. Repeat cultures NG thus far. Echo ordered. Will need TEE. ID on board.  05/07: repeat BCx neg. Initial BCx pan-sensitive staph aureus. On surgical pathology, bone at proximal margin negative for osteomyelitis. Echo EF 60-65%. (+)Moderate vegetation on the mitral valve --> TEE.   Consultants:  Podiatry  Infectious Disease  Cardiology  Procedures: 05/05: Transmetatarsal amputation left foot w/ Dr Alberteen Spindle.      ASSESSMENT & PLAN:   Principal Problem:   Osteomyelitis of left foot (HCC) Active Problems:   Cellulitis of right lower extremity   Sepsis (HCC)   Type 2 diabetes mellitus with diabetic foot ulcer (HCC)   Hypertension   HLD (hyperlipidemia)   Hypothyroidism   Hypokalemia   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, need TEE to confirm     Sepsis due to left foot osteomyelitis and cellulitis of  right lower extremity Staph aureus bacteremia pan-sensitive Concern for mitral valve vegetation on TTE  Sepsis parameters have resolved  S/p Transmetatarsal amputation left foot w/ Dr Alberteen Spindle. Abx: 05/03 Zosyn in ED --> 05/03 vancomycin, Flagyl, Rocephin --(pos BCx 05/04)--> cefazolin  continue on cefazolin 2gm iv q 8hr. If blood cx NGTD in the next 24hr, can place picc line --> PICC ordered  TTE ordered, (+)concern for mitral valve vegetation, will need TTE PRN Zofran for nausea tylenol and oxycodone for pain Underwent Transmetatarsal amputation left foot w/ Dr Alberteen Spindle 11/22/22.  ID following to guide further IV abx Pt is NWB, will need PT --> SNF/STR. Repeat BCx drawn 05/05   MRSA colonization mupirocin bid plus chg bathing x 5 days   Type 2 diabetes mellitus with diabetic foot ulcer (HCC):  Recent A1c 8.2, poorly controlled.   Patient is taking Xigduo XR, semaglutide and glargine insulin 36 units daily Glargine insulin 24 Units daily  SSI   Hypertension: Hold home blood pressure medications: Atenolol, Hyzaar since patient at risk of developing hypotension due to sepsis IV hydralazine as needed   HLD (hyperlipidemia) Crestor   Hypothyroidism Synthroid   Hypokalemia Replace as needed Monitor BMP       DVT prophylaxis: heparin  Pertinent IV fluids/nutrition: NPO pending surgery, IV fluids per orders  Central lines / invasive devices: none  Code Status: FULL CODE ACP documentation reviewed: 05/04 none on file   Current Admission Status: inpatient   TOC needs / Dispo plan: SNF, IV abx Barriers to discharge / significant pending items: needs TEE, pending final IV abx recs

## 2022-11-21 NOTE — Progress Notes (Signed)
PROGRESS NOTE    Alexander Gill   UJW:[id number] DOB: 1956/05/30  DOA: 11/20/2022 Date of Service: 11/21/22 PCP: [facility]     Brief Narrative / Hospital Course:  Alexander Gill is a 67 y.o. 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  05/03:  WBC 19.4, GFR> 60, potassium 3.4, temperature 101.5, blood pressure 120/62, heart rate 93, RR 20, oxygen saturation 99% on room air.  Patient is admitted to MedSurg for psteomyelitis, cellulitis w/ abscess L foot.  Dr. Alberteen Spindle for podiatry was consulted. L foot abscess cultured. Plan surgery tomorrow for amputation of the second ray with potentially the first ray as well.  05/04: surgery deferred, issue w/ power outage in the hospital. Pt stable   Consultants:  Podiatry   Procedures: none      ASSESSMENT & PLAN:   Principal Problem:   Osteomyelitis of left foot (HCC) Active Problems:   Cellulitis of right lower extremity   Sepsis (HCC)   Type 2 diabetes mellitus with diabetic foot ulcer (HCC)   Hypertension   HLD (hyperlipidemia)   Hypothyroidism   Hypokalemia     Sepsis due to left foot osteomyelitis and cellulitis of right lower extremity POA: WBC 19.4, fever of 101.5. Lactic acid normal at 1.3. consulted Dr. Alberteen Spindle of podiatry. vancomycin, Flagyl, Rocephin (patient received 1 dose of Zosyn in ED) PRN Zofran for nausea, tylenol and oxycodone for pain Blood cultures x 2  Planning amputation    Type 2 diabetes mellitus with diabetic foot ulcer (HCC):  Recent A1c 8.2, poorly controlled.   Patient is taking Xigduo XR, semaglutide and glargine insulin 36 units daily Glargine insulin 24 Units daily  SSI   Hypertension: Hold home blood pressure medications: Atenolol, Hyzaar since patient at risk of developing hypotension  due to sepsis IV hydralazine as needed   HLD (hyperlipidemia) Crestor   Hypothyroidism Synthroid   Hypokalemia Replace as needed Monitor BMP       DVT prophylaxis: heparin  Pertinent IV fluids/nutrition: NPO pending surgery, IV fluids per orders  Central lines / invasive devices: none  Code Status: FULL CODE ACP documentation reviewed: 05/04 none on file   Current Admission Status: inpatient   TOC needs / Dispo plan: expect may need HH/SNF, PT/OT to eval following surgery  Barriers to discharge / significant pending items: surgery, PT/OT , await BCx.              Subjective / Brief ROS:  Patient reports doing well Denies CP/SOB.  Pain controlled.  Denies new weakness.  Tolerating diet.  Reports no concerns w/ urination/defecation.   Family Communication: family atr bedside on rounds    Objective Findings:  Vitals:   11/21/22 1230 11/21/22 1300 11/21/22 1400 11/21/22 1459  BP: 119/65 118/69 123/65   Pulse: 69 72 67   Resp: (!) 26 17 (!) 27   Temp:    99.4 F (37.4 C)  TempSrc:    Oral  SpO2: 92% 95% 94%   Weight:      Height:        Intake/Output Summary (Last 24 hours) at 11/21/2022 1525 Last data filed at 11/21/2022 0126 Gross per 24 hour  Intake 1200 ml  Output --  Net 1200 ml   Filed Weights   11/20/22 1339  Weight: 83.9 kg    Examination:  Physical  Exam Constitutional:      General: He is not in acute distress. Cardiovascular:     Rate and Rhythm: Normal rate and regular rhythm.  Pulmonary:     Effort: Pulmonary effort is normal.     Breath sounds: Normal breath sounds.  Neurological:     General: No focal deficit present.     Mental Status: He is alert.  Psychiatric:        Mood and Affect: Mood normal.        Behavior: Behavior normal.          Scheduled Medications:   aspirin EC  81 mg Oral Daily   cholecalciferol  1,000 Units Oral Daily   heparin  5,000 Units Subcutaneous Q8H   insulin aspart  0-5 Units  Subcutaneous QHS   insulin aspart  0-9 Units Subcutaneous TID WC   insulin glargine-yfgn  24 Units Subcutaneous QHS   levothyroxine  25 mcg Oral Daily   rosuvastatin  20 mg Oral Daily    Continuous Infusions:   ceFAZolin (ANCEF) IV 2 g (11/21/22 1456)   metronidazole Stopped (11/21/22 1314)    PRN Medications:  acetaminophen, hydrALAZINE, ondansetron (ZOFRAN) IV, oxyCODONE  Antimicrobials from admission:  Anti-infectives (From admission, onward)    Start     Dose/Rate Route Frequency Ordered Stop   11/21/22 1400  ceFAZolin (ANCEF) IVPB 2g/100 mL premix        2 g 200 mL/hr over 30 Minutes Intravenous Every 8 hours 11/21/22 1006     11/20/22 2200  cefTRIAXone (ROCEPHIN) 2 g in sodium chloride 0.9 % 100 mL IVPB  Status:  Discontinued        2 g 200 mL/hr over 30 Minutes Intravenous Every 24 hours 11/20/22 1509 11/21/22 1006   11/20/22 2200  metroNIDAZOLE (FLAGYL) IVPB 500 mg        500 mg 100 mL/hr over 60 Minutes Intravenous Every 12 hours 11/20/22 1509     11/20/22 1445  vancomycin (VANCOCIN) IVPB 1000 mg/200 mL premix  Status:  Discontinued        1,000 mg 200 mL/hr over 60 Minutes Intravenous  Once 11/20/22 1432 11/20/22 1434   11/20/22 1445  piperacillin-tazobactam (ZOSYN) IVPB 3.375 g        3.375 g 100 mL/hr over 30 Minutes Intravenous  Once 11/20/22 1432 11/20/22 1548   11/20/22 1445  vancomycin (VANCOREADY) IVPB 2000 mg/400 mL        2,000 mg 200 mL/hr over 120 Minutes Intravenous  Once 11/20/22 1434 11/20/22 1728           Data Reviewed:  I have personally reviewed the following...  CBC: Recent Labs  Lab 11/20/22 1345 11/21/22 0609  WBC 19.4* 13.1*  NEUTROABS 17.9*  --   HGB 12.3* 10.5*  HCT 37.8* 32.8*  MCV 85.5 87.5  PLT 331 348   Basic Metabolic Panel: Recent Labs  Lab 11/20/22 1345 11/21/22 0609  NA 134* 138  K 3.4* 3.3*  CL 100 108  CO2 23 22  GLUCOSE 154* 131*  BUN 19 20  CREATININE 0.90 0.92  CALCIUM 8.5* 7.8*  MG 2.0  --     GFR: Estimated Creatinine Clearance: 90.5 mL/min (by C-G formula based on SCr of 0.92 mg/dL). Liver Function Tests: Recent Labs  Lab 11/20/22 1345  AST 18  ALT 14  ALKPHOS 105  BILITOT 1.2  PROT 7.7  ALBUMIN 2.6*   No results for input(s): "LIPASE", "AMYLASE" in the last 168 hours. No results  for input(s): "AMMONIA" in the last 168 hours. Coagulation Profile: Recent Labs  Lab 11/20/22 1531  INR 1.4*   Cardiac Enzymes: No results for input(s): "CKTOTAL", "CKMB", "CKMBINDEX", "TROPONINI" in the last 168 hours. BNP (last 3 results) No results for input(s): "PROBNP" in the last 8760 hours. HbA1C: No results for input(s): "HGBA1C" in the last 72 hours. CBG: Recent Labs  Lab 11/20/22 1744 11/20/22 1946 11/20/22 2132 11/21/22 1304  GLUCAP 201* 216* 197* 292*   Lipid Profile: No results for input(s): "CHOL", "HDL", "LDLCALC", "TRIG", "CHOLHDL", "LDLDIRECT" in the last 72 hours. Thyroid Function Tests: No results for input(s): "TSH", "T4TOTAL", "FREET4", "T3FREE", "THYROIDAB" in the last 72 hours. Anemia Panel: No results for input(s): "VITAMINB12", "FOLATE", "FERRITIN", "TIBC", "IRON", "RETICCTPCT" in the last 72 hours. Most Recent Urinalysis On File:  No results found for: "COLORURINE", "APPEARANCEUR", "LABSPEC", "PHURINE", "GLUCOSEU", "HGBUR", "BILIRUBINUR", "KETONESUR", "PROTEINUR", "UROBILINOGEN", "NITRITE", "LEUKOCYTESUR" Sepsis Labs: @LABRCNTIP (procalcitonin:4,lacticidven:4) Microbiology: Recent Results (from the past 240 hour(s))  Blood culture (routine x 2)     Status: None (Preliminary result)   Collection Time: 11/20/22  2:24 PM   Specimen: BLOOD RIGHT FOREARM  Result Value Ref Range Status   Specimen Description   Final    BLOOD RIGHT FOREARM Performed at [facility], [address] [place], [place] [address]    Special Requests   Final    BOTTLES DRAWN AEROBIC AND ANAEROBIC Blood Culture results may not be optimal due to an excessive volume of  blood received in culture bottles   Culture  Setup Time   Final    GRAM POSITIVE COCCI IN BOTH AEROBIC AND ANAEROBIC BOTTLES CRITICAL RESULT CALLED TO, READ BACK BY AND VERIFIED WITH: NATHAN BELUE AT 0601 11/21/22.PMF Performed at [facility], [address] [place], [place] [address]    Culture [facility] POSITIVE COCCI  Final   Report Status PENDING  Incomplete  Blood culture (routine x 2)     Status: None (Preliminary result)   Collection Time: 11/20/22  2:49 PM   Specimen: BLOOD LEFT FOREARM  Result Value Ref Range Status   Specimen Description   Final    BLOOD LEFT FOREARM Performed at [facility], [address], [place], [place] [address]    Special Requests   Final    BOTTLES DRAWN AEROBIC AND ANAEROBIC Blood Culture results may not be optimal due to an excessive volume of blood received in culture bottles   Culture  Setup Time   Final    Organism ID to follow GRAM POSITIVE COCCI AEROBIC BOTTLE ONLY CRITICAL RESULT CALLED TO, READ BACK BY AND VERIFIED WITHDawayne Cirri @ [phone] 11/21/22 [facility] Performed at [facility], [address] [place], [place] [address]    Culture GRAM POSITIVE COCCI  Final   Report Status PENDING  Incomplete  Blood Culture ID Panel (Reflexed)     Status: Abnormal   Collection Time: 11/20/22  2:49 PM  Result Value Ref Range Status   Enterococcus faecalis NOT DETECTED NOT DETECTED Final   Enterococcus Faecium NOT DETECTED NOT DETECTED Final   Listeria monocytogenes NOT DETECTED NOT DETECTED Final   Staphylococcus species DETECTED (A) NOT DETECTED Final    Comment: CRITICAL RESULT CALLED TO, READ BACK BY AND VERIFIED WITH: NATHAN BELUE @ 0440 11/21/22 [facility]    Staphylococcus aureus (BCID) DETECTED (A) NOT DETECTED Final    Comment: CRITICAL RESULT CALLED TO, READ BACK BY AND VERIFIED WITH: NATHAN BELUE @ 0440 11/21/22 [facility]  Staphylococcus epidermidis NOT DETECTED NOT DETECTED Final   Staphylococcus lugdunensis NOT DETECTED NOT  DETECTED Final   Streptococcus species NOT DETECTED NOT DETECTED Final   Streptococcus agalactiae NOT DETECTED NOT DETECTED Final   Streptococcus pneumoniae NOT DETECTED NOT DETECTED Final   Streptococcus pyogenes NOT DETECTED NOT DETECTED Final   A.calcoaceticus-baumannii NOT DETECTED NOT DETECTED Final   Bacteroides fragilis NOT DETECTED NOT DETECTED Final   Enterobacterales NOT DETECTED NOT DETECTED Final   Enterobacter cloacae complex NOT DETECTED NOT DETECTED Final   Escherichia coli NOT DETECTED NOT DETECTED Final   Klebsiella aerogenes NOT DETECTED NOT DETECTED Final   Klebsiella oxytoca NOT DETECTED NOT DETECTED Final   Klebsiella pneumoniae NOT DETECTED NOT DETECTED Final   Proteus species NOT DETECTED NOT DETECTED Final   Salmonella species NOT DETECTED NOT DETECTED Final   Serratia marcescens NOT DETECTED NOT DETECTED Final   Haemophilus influenzae NOT DETECTED NOT DETECTED Final   Neisseria meningitidis NOT DETECTED NOT DETECTED Final   Pseudomonas aeruginosa NOT DETECTED NOT DETECTED Final   Stenotrophomonas maltophilia NOT DETECTED NOT DETECTED Final   Candida albicans NOT DETECTED NOT DETECTED Final   Candida auris NOT DETECTED NOT DETECTED Final   Candida glabrata NOT DETECTED NOT DETECTED Final   Candida krusei NOT DETECTED NOT DETECTED Final   Candida parapsilosis NOT DETECTED NOT DETECTED Final   Candida tropicalis NOT DETECTED NOT DETECTED Final   Cryptococcus neoformans/gattii NOT DETECTED NOT DETECTED Final   Meth resistant mecA/C and MREJ NOT DETECTED NOT DETECTED Final    Comment: Performed at [facility], [address] [place], [place] [address]  Aerobic/Anaerobic Culture w Gram Stain (surgical/deep wound)     Status: None (Preliminary result)   Collection Time: 11/20/22  5:51 PM   Specimen: Wound  Result Value Ref Range Status   Specimen Description   Final    WOUND Performed at [facility], [address] [place], [place]  [address]    Special Requests   Final    LEFT FOOT Performed at [facility], [address] [place], [place] [address]    Gram Stain   Final    RARE WBC PRESENT, PREDOMINANTLY PMN NO ORGANISMS SEEN Performed at [facility], [address], [place], [place] [address]    Culture PENDING  Incomplete   Report Status PENDING  Incomplete      Radiology Studies last 3 days: MR FOOT LEFT WO CONTRAST  Result Date: 11/21/2022 CLINICAL DATA:  Foot swelling, diabetic, osteomyelitis suspected. EXAM: MRI OF THE LEFT FOOT WITHOUT CONTRAST TECHNIQUE: Multiplanar, multisequence MR imaging of the left forefoot was performed. No intravenous contrast was administered. COMPARISON:  Radiographs dated Nov 20, 2022 FINDINGS: Bones/Joint/Cartilage Osseous erosion at the first metatarsophalangeal joint with bone marrow edema of the distal half of the first metatarsal and residual proximal phalanx. There is also edema and erosion of the distal phalanx of the first digit. There is bone marrow edema and osseous erosion at the second metatarsal head and proximal phalanx of the second digit suggesting pathological fracture with joint effusion. There is also marrow edema of the proximal phalanx of the third digit which in the presence of adjacent infectious/inflammatory process is consistent with osteomyelitis. Ligaments Evaluation of ligaments at the first and second metatarsophalangeal joints is limited secondary to ongoing chronic infectious/inflammatory process. Muscles and Tendons Flexor and extensor tendons appear intact. Increased signal of the plantar muscles suggesting myopathy/myositis. Soft tissues There is a fluid collection about the  second metatarsophalangeal joint measuring at least 2.3 x 1.3 x 3.1 cm with an open wound about dorsal aspect of the foot and marked surrounding inflammatory changes consistent with an abscess. There is also deep skin wound and edema and inflammatory changes about the first  metatarsophalangeal joint suggesting chronic infectious/inflammatory process/abscess IMPRESSION: 1. Osteomyelitis of the distal half of the first metatarsal and residual proximal phalanx of the first digit, likely secondary to ongoing osteomyelitis. 2. Abnormal marrow signal of the second metatarsal head and proximal phalanx of the second digit with fracture of the second metatarsal head, likely sequela of osteomyelitis with septic arthritis. 3. Osteomyelitis of the proximal phalanx of the third digit. 4. 2.3 x 1.3 x 3.1 cm fluid collection about the second metatarsophalangeal joint with open wound about the dorsal aspect of the foot marked surrounding inflammatory changes consistent with abscess. 5. Deep skin wound and edema and inflammatory changes about the first metatarsophalangeal joint suggesting chronic infectious/inflammatory process/abscess. Electronically Signed   By: Larose Hires D.O.   On: 11/21/2022 09:39   US Venous Img Lower Unilateral Right  Result Date: 11/20/2022 CLINICAL DATA:  Swelling EXAM: RIGHT LOWER EXTREMITY VENOUS DOPPLER ULTRASOUND TECHNIQUE: Gray-scale sonography with compression, as well as color and duplex ultrasound, were performed to evaluate the deep venous system(s) from the level of the common femoral vein through the popliteal and proximal calf veins. COMPARISON:  None Available. FINDINGS: VENOUS Normal compressibility of the common femoral, superficial femoral, and popliteal veins, as well as the visualized calf veins. Visualized portions of profunda femoral vein and great saphenous vein unremarkable. No filling defects to suggest DVT on grayscale or color Doppler imaging. Doppler waveforms show normal direction of venous flow, normal respiratory plasticity and response to augmentation. Limited views of the contralateral common femoral vein are unremarkable. OTHER Enlarged right inguinal lymph node is present measuring 2.1 x 1.1 x 1.7 cm. Limitations: none IMPRESSION: 1. No  evidence of right lower extremity DVT. 2. Enlarged right inguinal lymph node. Electronically Signed   By: Darliss Cheney M.D.   On: 11/20/2022 16:03   DG Foot Complete Right  Result Date: 11/20/2022 CLINICAL DATA:  Wounds on both feet.  Fever.  Generalized malaise. EXAM: RIGHT FOOT COMPLETE - 3+ VIEW COMPARISON:  10/22/2021. FINDINGS: Posterior dislocation of the second toe proximal phalanx in relation to the second metatarsal head, new since the prior exam. Resorption of the head of the third metatarsal as progressed since the prior study. Margin is now smooth consistent with the sequela of osteomyelitis. There is also a smooth area of resorption along the lateral fifth metatarsal head. Lucency and sclerosis noted in midfoot with bony fragmentation, new since the prior exam. This is noted involving the navicular medial cuneiform articulations and the medial and lateral cuneiform metatarsal articulations predominantly. 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 Diffuse surrounding soft tissue edema.  No soft tissue air. IMPRESSION: 1. Significant interval changes since the prior right foot radiographs. 2. Posterior dislocation of the second toe at the second MTP joint. 3. Advanced arthropathic changes now noted at the midfoot. Although this may be due to diabetic neural arthropathy, septic arthropathy should be considered. 4. No other evidence of active osteomyelitis. 5. Significant diffuse soft tissue swelling. Electronically Signed   By: Amie Portland M.D.   On: 11/20/2022 15:24   DG Foot Complete Left  Result Date: 11/20/2022 CLINICAL DATA:  Wounds to both feet. Feet swelling. Fever. Generalized malaise. EXAM: LEFT FOOT - COMPLETE 3+ VIEW COMPARISON:  10/22/2021 and older exams. FINDINGS: There is significant resorption of the first metatarsal head with a concave deformity of the base of the great toe proximal phalanx. These findings are new since the prior study. There is also been resorption of the  distal shaft and head of the proximal phalanx of the great toe with mild resorption at the base of the great toe distal phalanx. There is a mildly comminuted fracture across the base of the second metatarsal head, also new. There is been interval fusion of the PIP joint of the second toe. Chronic appearing resorption is noted of the distal phalanx of the third toe. There is a smooth area of resorption/remottling along the lateral head of the third metatarsal. There is significant forefoot soft tissue swelling. No soft tissue air. IMPRESSION: 1. Significant changes since the prior left foot radiographs. Resorptive changes at the first metatarsophalangeal joint is consistent with osteomyelitis, possibly a septic arthritis. Additional bone resorption of the great toe proximal phalanx and base of the distal phalanx of the great toe is also suspected to be osteomyelitis. New comminuted fracture across the head of the second metatarsal likely due to underlying osteomyelitis. Electronically Signed   By: Amie Portland M.D.   On: 11/20/2022 15:20             LOS: 1 day       Sunnie Nielsen, DO [facility] 11/21/2022, 3:25 PM    Dictation software may have been used to generate the above note. Typos may occur and escape review in typed/dictated notes. Please contact Dr Lyn Hollingshead directly for clarity if needed.  Staff may message me via secure chat in Epic  but this may not receive an immediate response,  please page me for urgent matters!  If 7PM-7AM, please contact night coverage [email or web address]

## 2022-11-22 ENCOUNTER — Inpatient Hospital Stay: Payer: BC Managed Care – PPO | Admitting: Anesthesiology

## 2022-11-22 ENCOUNTER — Encounter
Admission: EM | Disposition: A | Discharge status: Skilled Nursing Facility | Payer: Self-pay | Source: Home / Self Care | Attending: Osteopathic Medicine

## 2022-11-22 DIAGNOSIS — A419 Sepsis, unspecified organism: Secondary | ICD-10-CM | POA: Diagnosis not present

## 2022-11-22 DIAGNOSIS — M86172 Other acute osteomyelitis, left ankle and foot: Secondary | ICD-10-CM | POA: Diagnosis not present

## 2022-11-22 DIAGNOSIS — E11621 Type 2 diabetes mellitus with foot ulcer: Secondary | ICD-10-CM | POA: Diagnosis not present

## 2022-11-22 DIAGNOSIS — L03115 Cellulitis of right lower limb: Secondary | ICD-10-CM | POA: Diagnosis not present

## 2022-11-22 HISTORY — PX: AMPUTATION: SHX166

## 2022-11-22 LAB — CULTURE, BLOOD (ROUTINE X 2)

## 2022-11-22 LAB — GLUCOSE, CAPILLARY
Glucose-Capillary: 180 mg/dL — ABNORMAL HIGH (ref 70–99)
Glucose-Capillary: 181 mg/dL — ABNORMAL HIGH (ref 70–99)
Glucose-Capillary: 160 mg/dL — ABNORMAL HIGH (ref 70–99)
Glucose-Capillary: 282 mg/dL — ABNORMAL HIGH (ref 70–99)
Glucose-Capillary: 240 mg/dL — ABNORMAL HIGH (ref 70–99)

## 2022-11-22 LAB — AEROBIC/ANAEROBIC CULTURE W GRAM STAIN (SURGICAL/DEEP WOUND)

## 2022-11-22 SURGERY — AMPUTATION, FOOT, RAY
Anesthesia: General | Site: Foot | Laterality: Left

## 2022-11-22 MED ORDER — PROPOFOL 10 MG/ML IV BOLUS
INTRAVENOUS | Status: AC
  Filled 2022-11-22: qty 20

## 2022-11-22 MED ORDER — ACETAMINOPHEN 10 MG/ML IV SOLN
INTRAVENOUS | Status: DC | PRN
  Administered 2022-11-22: 1000 mg via INTRAVENOUS

## 2022-11-22 MED ORDER — OXYCODONE HCL 5 MG PO TABS: 5.0000 mg | ORAL_TABLET | Freq: Once | ORAL | Status: DC | PRN

## 2022-11-22 MED ORDER — MIDAZOLAM HCL 2 MG/2ML IJ SOLN
INTRAMUSCULAR | Status: AC
  Filled 2022-11-22: qty 2

## 2022-11-22 MED ORDER — OXYCODONE HCL 5 MG/5ML PO SOLN: 5.0000 mg | Freq: Once | ORAL | Status: DC | PRN

## 2022-11-22 MED ORDER — LACTATED RINGERS IV SOLN: INTRAVENOUS | Status: DC | PRN

## 2022-11-22 MED ORDER — PROPOFOL 10 MG/ML IV BOLUS
INTRAVENOUS | Status: DC | PRN
  Administered 2022-11-22: 150 mg via INTRAVENOUS
  Administered 2022-11-22: 100 mg via INTRAVENOUS
  Administered 2022-11-22 (×2): 50 mg via INTRAVENOUS

## 2022-11-22 MED ORDER — MIDAZOLAM HCL 2 MG/2ML IJ SOLN
INTRAMUSCULAR | Status: DC | PRN
  Administered 2022-11-22: 2 mg via INTRAVENOUS

## 2022-11-22 MED ORDER — FENTANYL CITRATE (PF) 100 MCG/2ML IJ SOLN
INTRAMUSCULAR | Status: AC
  Filled 2022-11-22: qty 2

## 2022-11-22 MED ORDER — SUCCINYLCHOLINE CHLORIDE 200 MG/10ML IV SOSY
PREFILLED_SYRINGE | INTRAVENOUS | Status: DC | PRN
  Administered 2022-11-22: 100 mg via INTRAVENOUS

## 2022-11-22 MED ORDER — ONDANSETRON HCL 4 MG/2ML IJ SOLN
INTRAMUSCULAR | Status: AC
  Filled 2022-11-22: qty 2

## 2022-11-22 MED ORDER — LIDOCAINE HCL (CARDIAC) PF 100 MG/5ML IV SOSY
PREFILLED_SYRINGE | INTRAVENOUS | Status: DC | PRN
  Administered 2022-11-22: 80 mg via INTRAVENOUS

## 2022-11-22 MED ORDER — ONDANSETRON HCL 4 MG/2ML IJ SOLN
INTRAMUSCULAR | Status: DC | PRN
  Administered 2022-11-22: 4 mg via INTRAVENOUS

## 2022-11-22 MED ORDER — PHENYLEPHRINE 80 MCG/ML (10ML) SYRINGE FOR IV PUSH (FOR BLOOD PRESSURE SUPPORT)
PREFILLED_SYRINGE | INTRAVENOUS | Status: DC | PRN
  Administered 2022-11-22 (×2): 160 ug via INTRAVENOUS
  Administered 2022-11-22 (×2): 80 ug via INTRAVENOUS
  Administered 2022-11-22 (×2): 160 ug via INTRAVENOUS

## 2022-11-22 MED ORDER — FENTANYL CITRATE (PF) 100 MCG/2ML IJ SOLN: 25.0000 ug | INTRAMUSCULAR | Status: DC | PRN

## 2022-11-22 MED ORDER — MORPHINE SULFATE (PF) 2 MG/ML IV SOLN: 2.0000 mg | INTRAVENOUS | Status: DC | PRN

## 2022-11-22 MED ORDER — PHENYLEPHRINE 80 MCG/ML (10ML) SYRINGE FOR IV PUSH (FOR BLOOD PRESSURE SUPPORT)
PREFILLED_SYRINGE | INTRAVENOUS | Status: AC
  Filled 2022-11-22: qty 10

## 2022-11-22 MED ORDER — 0.9 % SODIUM CHLORIDE (POUR BTL) OPTIME
TOPICAL | Status: DC | PRN
  Administered 2022-11-22: 1000 mL

## 2022-11-22 MED ORDER — ACETAMINOPHEN 10 MG/ML IV SOLN
INTRAVENOUS | Status: AC
  Filled 2022-11-22: qty 100

## 2022-11-22 MED ORDER — OXYCODONE-ACETAMINOPHEN 5-325 MG PO TABS
1.0000 | ORAL_TABLET | ORAL | Status: DC | PRN
  Administered 2022-11-22 – 2022-11-23 (×4): 2 via ORAL
  Administered 2022-11-23: 1 via ORAL
  Administered 2022-11-24 – 2022-11-25 (×4): 2 via ORAL
  Administered 2022-11-25: 1 via ORAL
  Administered 2022-11-26: 2 via ORAL
  Filled 2022-11-22 (×2): qty 2
  Filled 2022-11-22: qty 1
  Filled 2022-11-22 (×5): qty 2
  Filled 2022-11-22: qty 1
  Filled 2022-11-22 (×2): qty 2

## 2022-11-22 MED ORDER — FENTANYL CITRATE (PF) 100 MCG/2ML IJ SOLN
INTRAMUSCULAR | Status: DC | PRN
  Administered 2022-11-22: 50 ug via INTRAVENOUS
  Administered 2022-11-22 (×2): 25 ug via INTRAVENOUS

## 2022-11-22 MED ORDER — SUCCINYLCHOLINE CHLORIDE 200 MG/10ML IV SOSY
PREFILLED_SYRINGE | INTRAVENOUS | Status: AC
  Filled 2022-11-22: qty 10

## 2022-11-22 MED ORDER — LIDOCAINE HCL (PF) 2 % IJ SOLN
INTRAMUSCULAR | Status: AC
  Filled 2022-11-22: qty 5

## 2022-11-22 MED ORDER — EPHEDRINE SULFATE (PRESSORS) 50 MG/ML IJ SOLN
INTRAMUSCULAR | Status: DC | PRN
  Administered 2022-11-22 (×2): 5 mg via INTRAVENOUS

## 2022-11-22 MED ORDER — BUPIVACAINE HCL 0.5 % IJ SOLN
INTRAMUSCULAR | Status: DC | PRN
  Administered 2022-11-22: 16 mL

## 2022-11-22 MED ORDER — EPHEDRINE 5 MG/ML INJ
INTRAVENOUS | Status: AC
  Filled 2022-11-22: qty 5

## 2022-11-22 SURGICAL SUPPLY — 57 items
BLADE MED AGGRESSIVE (BLADE) ×1 IMPLANT
BLADE OSC/SAGITTAL MD 5.5X18 (BLADE) ×1 IMPLANT
BLADE SURG 15 STRL LF DISP TIS (BLADE) ×2 IMPLANT
BLADE SURG 15 STRL SS (BLADE) ×3
BLADE SURG MINI STRL (BLADE) ×1 IMPLANT
BNDG ELASTIC 4INX 5YD STR LF (GAUZE/BANDAGES/DRESSINGS) ×1 IMPLANT
BNDG ESMARCH 4 X 12 STRL LF (GAUZE/BANDAGES/DRESSINGS) ×1
BNDG ESMARCH 4X12 STRL LF (GAUZE/BANDAGES/DRESSINGS) ×1 IMPLANT
BNDG GAUZE DERMACEA FLUFF 4 (GAUZE/BANDAGES/DRESSINGS) ×1 IMPLANT
BNDG STRETCH GAUZE 3IN X12FT (GAUZE/BANDAGES/DRESSINGS) ×1 IMPLANT
CNTNR URN SCR LID CUP LEK RST (MISCELLANEOUS) IMPLANT
CONT SPEC 4OZ STRL OR WHT (MISCELLANEOUS) ×1
CUFF TOURN SGL QUICK 12 (TOURNIQUET CUFF) ×1 IMPLANT
CUFF TOURN SGL QUICK 18X4 (TOURNIQUET CUFF) ×1 IMPLANT
DRAPE FLUOR MINI C-ARM 54X84 (DRAPES) ×1 IMPLANT
DURAPREP 26ML APPLICATOR (WOUND CARE) ×1 IMPLANT
ELECT REM PT RETURN 9FT ADLT (ELECTROSURGICAL) ×1
ELECTRODE REM PT RTRN 9FT ADLT (ELECTROSURGICAL) ×1 IMPLANT
GAUZE SPONGE 4X4 12PLY STRL (GAUZE/BANDAGES/DRESSINGS) ×1 IMPLANT
GAUZE SPONGE 4X4 16PLY XRAY LF (GAUZE/BANDAGES/DRESSINGS) IMPLANT
GAUZE STRETCH 2X75IN STRL (MISCELLANEOUS) ×1 IMPLANT
GAUZE XEROFORM 1X8 LF (GAUZE/BANDAGES/DRESSINGS) ×1 IMPLANT
GLOVE BIO SURGEON STRL SZ7.5 (GLOVE) ×1 IMPLANT
GLOVE INDICATOR 8.0 STRL GRN (GLOVE) ×1 IMPLANT
GOWN STRL REUS W/ TWL LRG LVL3 (GOWN DISPOSABLE) ×2 IMPLANT
GOWN STRL REUS W/TWL LRG LVL3 (GOWN DISPOSABLE) ×2
HANDPIECE VERSAJET DEBRIDEMENT (MISCELLANEOUS) ×1 IMPLANT
IV NS IRRIG 3000ML ARTHROMATIC (IV SOLUTION) ×1 IMPLANT
KIT TURNOVER KIT A (KITS) ×1 IMPLANT
LABEL OR SOLS (LABEL) ×1 IMPLANT
MANIFOLD NEPTUNE II (INSTRUMENTS) ×1 IMPLANT
MAT ABSORB  FLUID 56X50 GRAY (MISCELLANEOUS) ×1
MAT ABSORB FLUID 56X50 GRAY (MISCELLANEOUS) IMPLANT
NDL FILTER BLUNT 18X1 1/2 (NEEDLE) ×1 IMPLANT
NDL HYPO 25X1 1.5 SAFETY (NEEDLE) ×2 IMPLANT
NEEDLE FILTER BLUNT 18X1 1/2 (NEEDLE) ×1 IMPLANT
NEEDLE HYPO 25X1 1.5 SAFETY (NEEDLE) ×2 IMPLANT
NS IRRIG 500ML POUR BTL (IV SOLUTION) ×1 IMPLANT
PACK EXTREMITY ARMC (MISCELLANEOUS) ×1 IMPLANT
PAD ABD DERMACEA PRESS 5X9 (GAUZE/BANDAGES/DRESSINGS) ×2 IMPLANT
SOL PREP PVP 2OZ (MISCELLANEOUS) ×3
SOLUTION PREP PVP 2OZ (MISCELLANEOUS) ×1 IMPLANT
STAPLER SKIN PROX 35W (STAPLE) IMPLANT
STOCKINETTE BIAS CUT 4 980044 (GAUZE/BANDAGES/DRESSINGS) ×1 IMPLANT
STOCKINETTE STRL 6IN 960660 (GAUZE/BANDAGES/DRESSINGS) ×1 IMPLANT
STRIP CLOSURE SKIN 1/4X4 (GAUZE/BANDAGES/DRESSINGS) ×1 IMPLANT
SUT ETHILON 3-0 FS-10 30 BLK (SUTURE) ×1
SUT ETHILON 4-0 (SUTURE)
SUT ETHILON 4-0 FS2 18XMFL BLK (SUTURE)
SUT VIC AB 2-0 CT1 36 (SUTURE) IMPLANT
SUT VICRYL+ 3-0 36IN CT-1 (SUTURE) IMPLANT
SUTURE EHLN 3-0 FS-10 30 BLK (SUTURE) ×1 IMPLANT
SUTURE ETHLN 4-0 FS2 18XMF BLK (SUTURE) IMPLANT
SWAB CULTURE AMIES ANAERIB BLU (MISCELLANEOUS) ×1 IMPLANT
SYR 10ML LL (SYRINGE) ×1 IMPLANT
TRAP FLUID SMOKE EVACUATOR (MISCELLANEOUS) ×1 IMPLANT
WATER STERILE IRR 500ML POUR (IV SOLUTION) ×1 IMPLANT

## 2022-11-22 NOTE — Transfer of Care (Signed)
Immediate Anesthesia Transfer of Care Note  Patient: Alexander Gill  Procedure(s) Performed: Transmetatarsal Amuptation Left Foot (Left: Foot)  Patient Location: PACU  Anesthesia Type:General  Level of Consciousness: awake, alert , and oriented  Airway & Oxygen Therapy: Patient Spontanous Breathing and Patient connected to face mask oxygen  Post-op Assessment: Report given to RN and Post -op Vital signs reviewed and stable  Post vital signs: Reviewed and stable  Last Vitals:  Vitals Value Taken Time  BP 121/66 11/22/22 1007  Temp 36.8 C 11/22/22 1007  Pulse 66 11/22/22 1010  Resp 19 11/22/22 1009  SpO2 96 % 11/22/22 1010  Vitals shown include unvalidated device data.  Last Pain:  Vitals:   11/22/22 0730  TempSrc:   PainSc: 2       Patients Stated Pain Goal: 0 (11/21/22 2021)  Complications: No notable events documented.

## 2022-11-22 NOTE — Interval H&P Note (Signed)
See above consult note with addendum.

## 2022-11-22 NOTE — Anesthesia Postprocedure Evaluation (Signed)
Anesthesia Post Note  Patient: Alexander Gill  Procedure(s) Performed: Transmetatarsal Amuptation Left Foot (Left: Foot)  Patient location during evaluation: PACU Anesthesia Type: General Level of consciousness: awake and alert Pain management: pain level controlled Vital Signs Assessment: post-procedure vital signs reviewed and stable Respiratory status: spontaneous breathing, nonlabored ventilation, respiratory function stable and patient connected to nasal cannula oxygen Cardiovascular status: blood pressure returned to baseline and stable Postop Assessment: no apparent nausea or vomiting Anesthetic complications: no  No notable events documented.   Last Vitals:  Vitals:   11/22/22 1121 11/22/22 1210  BP: 127/73 125/69  Pulse: (!) 58 (!) 55  Resp: 17 18  Temp: 37.3 C 36.7 C  SpO2: 97% 100%    Last Pain:  Vitals:   11/22/22 1210  TempSrc: Oral  PainSc:                  Stephanie Coup

## 2022-11-22 NOTE — Op Note (Signed)
Date of operation: 11/22/2022.  Surgeon: Ricci Barker D.P.M.  Preoperative diagnosis: Osteomyelitis left first and second metatarsals with abscess forefoot.  Postoperative diagnosis: Same.  Procedure: Transmetatarsal amputation left foot.  Anesthesia: LMA.  Hemostasis: Pneumatic tourniquet left ankle 250 mmHg.  Estimated blood loss: Less than 5 cc.  Pathology: Left forefoot.  Cultures: Bone culture left second metatarsal.  Injectable: 16 cc 0.5% Marcaine plain.  Complications: None apparent.  Operative indications: This is a 67 year old diabetic male with significant neuropathy with history of osteomyelitis a year ago.  States that he recently noticed a new sore with drainage on his left foot this past week.  Presented to the emergency department where he was diagnosed with osteomyelitis in the first and second metatarsals with abscess in the forefoot.  Due to the extent of infection decision was made for transmetatarsal amputation on the left foot.  Operative procedure: Patient was taken to the operating room and placed on the table in the supine position.  Following satisfactory LMA anesthesia a pneumatic tourniquet was applied at the level of the left ankle and the foot was prepped and draped in the usual sterile fashion.  The foot was exsanguinated using an Esmarch and the tourniquet inflated to 250 mmHg.  Attention was then directed to the distal aspect of the left foot where an incision was made from medial to lateral extending over the dorsum of the foot proximal to the joints and abscessed area.  Incision was carried sharply down to the level of the bone with cauterization of bleeders as necessary.  Similar incision was then made plantarly proximal to the plantar ulceration on the left foot and carried sharply down to the level of the bone.  Periosteal dissection performed using a large key elevator along the dorsum and plantar aspects of the metatarsals.  Metatarsals 1 through 5 were  then incised proximally using a sagittal saw and the forefoot was removed in toto.  Sample of bone taken from the second metatarsal for cultures.  At this point decision was made for removal of the remainder of the second metatarsal base due to the appearance of osteomyelitis in its entirety.  Using a Beaver blade the bone was freed from the surrounding soft tissues and removed in multiple pieces using a large rongeur.  Debulking of the plantar tissues using a 15 blade with excision of elongated tendons at the end of the wound.  The wound was then flushed with copious amounts of sterile saline and closed using 2-0 Vicryl simple interrupted sutures for deep closure followed by 3-0 Vicryl simple interrupted sutures for superficial closure followed by skin closure using skin staples.  16 cc of 0.5% Marcaine plain then injected around the dorsal and plantar foot for postoperative analgesia.  Xeroform 4 x 4's ABDs and Kerlix then applied to the left foot.  Tourniquet was released.  Second Kerlix and Ace wrap then applied for compression.  The patient was awakened and transported to the PACU having tolerated the anesthesia and procedure well.

## 2022-11-22 NOTE — Progress Notes (Signed)
PROGRESS NOTE    Jaymar Scarpaci   OAC:[id number] DOB: 04-29-1956  DOA: 11/20/2022 Date of Service: 11/22/22 PCP: [facility]     Brief Narrative / Hospital Course:  Brandy Milum is a 67 y.o. 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  05/03:  WBC 19.4, GFR> 60, potassium 3.4, temperature 101.5, blood pressure 120/62, heart rate 93, RR 20, oxygen saturation 99% on room air.  Patient is admitted to MedSurg for psteomyelitis, cellulitis w/ abscess L foot.  Dr. Alberteen Spindle for podiatry was consulted. L foot abscess cultured. Plan surgery tomorrow for amputation of the second ray with potentially the first ray as well.  05/04: surgery was on schedule but had to be deferred d/t power outage in the hospital. Pt stable. WBC trending down. (+)BCx gram pos cocci - ID panel (+)staph --> abx narrowed to cefazolin  05/05: Underwent Transmetatarsal amputation left foot w/ Dr Alberteen Spindle. Repeat BCx drawn. Plan ID consult tomorrow to guide further IV abx, awaiting surgical culture/pathology. Pt is NWB, will need PT and possible SNF/STR.  Consultants:  Podiatry   Procedures: none      ASSESSMENT & PLAN:   Principal Problem:   Osteomyelitis of left foot (HCC) Active Problems:   Cellulitis of right lower extremity   Sepsis (HCC)   Type 2 diabetes mellitus with diabetic foot ulcer (HCC)   Hypertension   HLD (hyperlipidemia)   Hypothyroidism   Hypokalemia     Sepsis due to left foot osteomyelitis and cellulitis of right lower extremity Staph bacteremia  Sepsis parameters have resolved  Abx: 05/03 Zosyn in ED --> 05/03 vancomycin, Flagyl, Rocephin --(pos BCx 05/04)--> cefazolin  PRN Zofran for nausea tylenol and oxycodone for pain Underwent Transmetatarsal amputation left foot w/ Dr Alberteen Spindle  11/22/22.  Plan ID consult tomorrow to guide further IV abx, awaiting surgical culture/pathology.  Pt is NWB, will need PT and possible SNF/STR. Repeat BCx drawn 05/05   Type 2 diabetes mellitus with diabetic foot ulcer (HCC):  Recent A1c 8.2, poorly controlled.   Patient is taking Xigduo XR, semaglutide and glargine insulin 36 units daily Glargine insulin 24 Units daily  SSI   Hypertension: Hold home blood pressure medications: Atenolol, Hyzaar since patient at risk of developing hypotension due to sepsis IV hydralazine as needed   HLD (hyperlipidemia) Crestor   Hypothyroidism Synthroid   Hypokalemia Replace as needed Monitor BMP       DVT prophylaxis: heparin  Pertinent IV fluids/nutrition: NPO pending surgery, IV fluids per orders  Central lines / invasive devices: none  Code Status: FULL CODE ACP documentation reviewed: 05/04 none on file   Current Admission Status: inpatient   TOC needs / Dispo plan: expect may need HH/SNF, PT/OT to eval following surgery  Barriers to discharge / significant pending items: surgery, PT/OT , await BCx.              Subjective / Brief ROS:  Patient reports doing well Denies CP/SOB.  Pain controlled.    Family Communication: none at this time     Objective Findings:  Vitals:   11/22/22 1030 11/22/22 1055 11/22/22 1121 11/22/22 1210  BP: 130/71 129/75 127/73 125/69  Pulse: 65 (!) 58 (!) 58 (!) 55  Resp: 19 18 17 18   Temp: 98.6 F (37 C) 98.4 F (36.9 C) 99.1 F (37.3 C) 98  F (36.7 C)  TempSrc:   Oral Oral  SpO2: 91% 95% 97% 100%  Weight:      Height:        Intake/Output Summary (Last 24 hours) at 11/22/2022 1310 Last data filed at 11/22/2022 1010 Gross per 24 hour  Intake 1360 ml  Output 505 ml  Net 855 ml   Filed Weights   11/20/22 1339  Weight: 83.9 kg    Examination:  Physical Exam Constitutional:      General: He is not in acute distress. Cardiovascular:     Rate and Rhythm: Normal rate  and regular rhythm.  Pulmonary:     Effort: Pulmonary effort is normal.     Breath sounds: Normal breath sounds.  Neurological:     General: No focal deficit present.     Mental Status: He is alert.  Psychiatric:        Mood and Affect: Mood normal.        Behavior: Behavior normal.          Scheduled Medications:   aspirin EC  81 mg Oral Daily   cholecalciferol  1,000 Units Oral Daily   heparin  5,000 Units Subcutaneous Q8H   insulin aspart  0-5 Units Subcutaneous QHS   insulin aspart  0-9 Units Subcutaneous TID WC   insulin glargine-yfgn  24 Units Subcutaneous QHS   levothyroxine  25 mcg Oral Daily   rosuvastatin  20 mg Oral Daily    Continuous Infusions:   ceFAZolin (ANCEF) IV 2 g (11/22/22 1610)   metronidazole 500 mg (11/22/22 1106)    PRN Medications:  acetaminophen, hydrALAZINE, morphine injection, ondansetron (ZOFRAN) IV, oxyCODONE-acetaminophen  Antimicrobials from admission:  Anti-infectives (From admission, onward)    Start     Dose/Rate Route Frequency Ordered Stop   11/21/22 1400  ceFAZolin (ANCEF) IVPB 2g/100 mL premix        2 g 200 mL/hr over 30 Minutes Intravenous Every 8 hours 11/21/22 1006     11/20/22 2200  cefTRIAXone (ROCEPHIN) 2 g in sodium chloride 0.9 % 100 mL IVPB  Status:  Discontinued        2 g 200 mL/hr over 30 Minutes Intravenous Every 24 hours 11/20/22 1509 11/21/22 1006   11/20/22 2200  metroNIDAZOLE (FLAGYL) IVPB 500 mg        500 mg 100 mL/hr over 60 Minutes Intravenous Every 12 hours 11/20/22 1509     11/20/22 1445  vancomycin (VANCOCIN) IVPB 1000 mg/200 mL premix  Status:  Discontinued        1,000 mg 200 mL/hr over 60 Minutes Intravenous  Once 11/20/22 1432 11/20/22 1434   11/20/22 1445  piperacillin-tazobactam (ZOSYN) IVPB 3.375 g        3.375 g 100 mL/hr over 30 Minutes Intravenous  Once 11/20/22 1432 11/20/22 1548   11/20/22 1445  vancomycin (VANCOREADY) IVPB 2000 mg/400 mL        2,000 mg 200 mL/hr over 120 Minutes  Intravenous  Once 11/20/22 1434 11/20/22 1728           Data Reviewed:  I have personally reviewed the following...  CBC: Recent Labs  Lab 11/20/22 1345 11/21/22 0609  WBC 19.4* 13.1*  NEUTROABS 17.9*  --   HGB 12.3* 10.5*  HCT 37.8* 32.8*  MCV 85.5 87.5  PLT 331 348   Basic Metabolic Panel: Recent Labs  Lab 11/20/22 1345 11/21/22 0609  NA 134* 138  K 3.4* 3.3*  CL 100 108  CO2 23 22  GLUCOSE 154* 131*  BUN 19 20  CREATININE 0.90 0.92  CALCIUM 8.5* 7.8*  MG 2.0  --    GFR: Estimated Creatinine Clearance: 90.5 mL/min (by C-G formula based on SCr of 0.92 mg/dL). Liver Function Tests: Recent Labs  Lab 11/20/22 1345  AST 18  ALT 14  ALKPHOS 105  BILITOT 1.2  PROT 7.7  ALBUMIN 2.6*   No results for input(s): "LIPASE", "AMYLASE" in the last 168 hours. No results for input(s): "AMMONIA" in the last 168 hours. Coagulation Profile: Recent Labs  Lab 11/20/22 1531  INR 1.4*   Cardiac Enzymes: No results for input(s): "CKTOTAL", "CKMB", "CKMBINDEX", "TROPONINI" in the last 168 hours. BNP (last 3 results) No results for input(s): "PROBNP" in the last 8760 hours. HbA1C: No results for input(s): "HGBA1C" in the last 72 hours. CBG: Recent Labs  Lab 11/21/22 1704 11/21/22 2002 11/22/22 0735 11/22/22 1014 11/22/22 1121  GLUCAP 230* 231* 180* 181* 160*   Lipid Profile: No results for input(s): "CHOL", "HDL", "LDLCALC", "TRIG", "CHOLHDL", "LDLDIRECT" in the last 72 hours. Thyroid Function Tests: No results for input(s): "TSH", "T4TOTAL", "FREET4", "T3FREE", "THYROIDAB" in the last 72 hours. Anemia Panel: No results for input(s): "VITAMINB12", "FOLATE", "FERRITIN", "TIBC", "IRON", "RETICCTPCT" in the last 72 hours. Most Recent Urinalysis On File:  No results found for: "COLORURINE", "APPEARANCEUR", "LABSPEC", "PHURINE", "GLUCOSEU", "HGBUR", "BILIRUBINUR", "KETONESUR", "PROTEINUR", "UROBILINOGEN", "NITRITE", "LEUKOCYTESUR" Sepsis  Labs: @LABRCNTIP (procalcitonin:4,lacticidven:4) Microbiology: Recent Results (from the past 240 hour(s))  Blood culture (routine x 2)     Status: Abnormal (Preliminary result)   Collection Time: 11/20/22  2:24 PM   Specimen: BLOOD RIGHT FOREARM  Result Value Ref Range Status   Specimen Description   Final    BLOOD RIGHT FOREARM Performed at [facility], [address] [place], [place] [address]    Special Requests   Final    BOTTLES DRAWN AEROBIC AND ANAEROBIC Blood Culture results may not be optimal due to an excessive volume of blood received in culture bottles Performed at [facility], [address] [place], [place] [address]    Culture  Setup Time   Final    GRAM POSITIVE COCCI IN BOTH AEROBIC AND ANAEROBIC BOTTLES CRITICAL RESULT CALLED TO, READ BACK BY AND VERIFIED WITH: NATHAN BELUE AT 0601 11/21/22.PMF Performed at [facility], [address] [place], [place] [address]    Culture STAPHYLOCOCCUS AUREUS (A)  Final   Report Status PENDING  Incomplete  Blood culture (routine x 2)     Status: Abnormal (Preliminary result)   Collection Time: 11/20/22  2:49 PM   Specimen: BLOOD LEFT FOREARM  Result Value Ref Range Status   Specimen Description   Final    BLOOD LEFT FOREARM Performed at [facility], [address], [place], [place] [address]    Special Requests   Final    BOTTLES DRAWN AEROBIC AND ANAEROBIC Blood Culture results may not be optimal due to an excessive volume of blood received in culture bottles Performed at [facility], [address] [place], [place] [address]    Culture  Setup Time   Final    Organism ID to follow GRAM POSITIVE COCCI AEROBIC BOTTLE ONLY CRITICAL RESULT CALLED TO, READ BACK BY AND VERIFIED WITHDawayne Cirri @ 2952 11/21/22 [facility] Performed at [facility], [address], [place], [place] [address]    Culture (A)  Final    STAPHYLOCOCCUS AUREUS SUSCEPTIBILITIES TO FOLLOW Performed  at [facility],  [address], [place], [place] [address]    Report Status PENDING  Incomplete  Blood Culture ID Panel (Reflexed)     Status: Abnormal   Collection Time: 11/20/22  2:49 PM  Result Value Ref Range Status   Enterococcus faecalis NOT DETECTED NOT DETECTED Final   Enterococcus Faecium NOT DETECTED NOT DETECTED Final   Listeria monocytogenes NOT DETECTED NOT DETECTED Final   Staphylococcus species DETECTED (A) NOT DETECTED Final    Comment: CRITICAL RESULT CALLED TO, READ BACK BY AND VERIFIED WITH: NATHAN BELUE @ 0440 11/21/22 [facility]    Staphylococcus aureus (BCID) DETECTED (A) NOT DETECTED Final    Comment: CRITICAL RESULT CALLED TO, READ BACK BY AND VERIFIED WITH: NATHAN BELUE @ 0440 11/21/22 [facility]    Staphylococcus epidermidis NOT DETECTED NOT DETECTED Final   Staphylococcus lugdunensis NOT DETECTED NOT DETECTED Final   Streptococcus species NOT DETECTED NOT DETECTED Final   Streptococcus agalactiae NOT DETECTED NOT DETECTED Final   Streptococcus pneumoniae NOT DETECTED NOT DETECTED Final   Streptococcus pyogenes NOT DETECTED NOT DETECTED Final   A.calcoaceticus-baumannii NOT DETECTED NOT DETECTED Final   Bacteroides fragilis NOT DETECTED NOT DETECTED Final   Enterobacterales NOT DETECTED NOT DETECTED Final   Enterobacter cloacae complex NOT DETECTED NOT DETECTED Final   Escherichia coli NOT DETECTED NOT DETECTED Final   Klebsiella aerogenes NOT DETECTED NOT DETECTED Final   Klebsiella oxytoca NOT DETECTED NOT DETECTED Final   Klebsiella pneumoniae NOT DETECTED NOT DETECTED Final   Proteus species NOT DETECTED NOT DETECTED Final   Salmonella species NOT DETECTED NOT DETECTED Final   Serratia marcescens NOT DETECTED NOT DETECTED Final   Haemophilus influenzae NOT DETECTED NOT DETECTED Final   Neisseria meningitidis NOT DETECTED NOT DETECTED Final   Pseudomonas aeruginosa NOT DETECTED NOT DETECTED Final   Stenotrophomonas maltophilia NOT DETECTED NOT DETECTED Final    Candida albicans NOT DETECTED NOT DETECTED Final   Candida auris NOT DETECTED NOT DETECTED Final   Candida glabrata NOT DETECTED NOT DETECTED Final   Candida krusei NOT DETECTED NOT DETECTED Final   Candida parapsilosis NOT DETECTED NOT DETECTED Final   Candida tropicalis NOT DETECTED NOT DETECTED Final   Cryptococcus neoformans/gattii NOT DETECTED NOT DETECTED Final   Meth resistant mecA/C and MREJ NOT DETECTED NOT DETECTED Final    Comment: Performed at [facility], [address] [place], [place] [address]  Aerobic/Anaerobic Culture w Gram Stain (surgical/deep wound)     Status: None (Preliminary result)   Collection Time: 11/20/22  5:51 PM   Specimen: Wound  Result Value Ref Range Status   Specimen Description   Final    WOUND Performed at [facility], [address] [place], [place] [address]    Special Requests   Final    LEFT FOOT Performed at [facility], [address] [place], [place] [address]    Gram Stain   Final    RARE WBC PRESENT, PREDOMINANTLY PMN NO ORGANISMS SEEN Performed at [facility], [address] [place], [place] [address]    Culture PENDING  Incomplete   Report Status PENDING  Incomplete      Radiology Studies last 3 days: MR FOOT LEFT WO CONTRAST  Result Date: 11/21/2022 CLINICAL DATA:  Foot swelling, diabetic, osteomyelitis suspected. EXAM: MRI OF THE LEFT FOOT WITHOUT CONTRAST TECHNIQUE: Multiplanar, multisequence MR imaging of the left forefoot was performed. No intravenous contrast was administered. COMPARISON:  Radiographs dated Nov 20, 2022 FINDINGS: Bones/Joint/Cartilage Osseous erosion at  the first metatarsophalangeal joint with bone marrow edema of the distal half of the first metatarsal and residual proximal phalanx. There is also edema and erosion of the distal phalanx of the first digit. There is bone marrow edema and osseous erosion at the second metatarsal head and proximal phalanx of the second  digit suggesting pathological fracture with joint effusion. There is also marrow edema of the proximal phalanx of the third digit which in the presence of adjacent infectious/inflammatory process is consistent with osteomyelitis. Ligaments Evaluation of ligaments at the first and second metatarsophalangeal joints is limited secondary to ongoing chronic infectious/inflammatory process. Muscles and Tendons Flexor and extensor tendons appear intact. Increased signal of the plantar muscles suggesting myopathy/myositis. Soft tissues There is a fluid collection about the second metatarsophalangeal joint measuring at least 2.3 x 1.3 x 3.1 cm with an open wound about dorsal aspect of the foot and marked surrounding inflammatory changes consistent with an abscess. There is also deep skin wound and edema and inflammatory changes about the first metatarsophalangeal joint suggesting chronic infectious/inflammatory process/abscess IMPRESSION: 1. Osteomyelitis of the distal half of the first metatarsal and residual proximal phalanx of the first digit, likely secondary to ongoing osteomyelitis. 2. Abnormal marrow signal of the second metatarsal head and proximal phalanx of the second digit with fracture of the second metatarsal head, likely sequela of osteomyelitis with septic arthritis. 3. Osteomyelitis of the proximal phalanx of the third digit. 4. 2.3 x 1.3 x 3.1 cm fluid collection about the second metatarsophalangeal joint with open wound about the dorsal aspect of the foot marked surrounding inflammatory changes consistent with abscess. 5. Deep skin wound and edema and inflammatory changes about the first metatarsophalangeal joint suggesting chronic infectious/inflammatory process/abscess. Electronically Signed   By: Larose Hires D.O.   On: 11/21/2022 09:39   US Venous Img Lower Unilateral Right  Result Date: 11/20/2022 CLINICAL DATA:  Swelling EXAM: RIGHT LOWER EXTREMITY VENOUS DOPPLER ULTRASOUND TECHNIQUE: Gray-scale  sonography with compression, as well as color and duplex ultrasound, were performed to evaluate the deep venous system(s) from the level of the common femoral vein through the popliteal and proximal calf veins. COMPARISON:  None Available. FINDINGS: VENOUS Normal compressibility of the common femoral, superficial femoral, and popliteal veins, as well as the visualized calf veins. Visualized portions of profunda femoral vein and great saphenous vein unremarkable. No filling defects to suggest DVT on grayscale or color Doppler imaging. Doppler waveforms show normal direction of venous flow, normal respiratory plasticity and response to augmentation. Limited views of the contralateral common femoral vein are unremarkable. OTHER Enlarged right inguinal lymph node is present measuring 2.1 x 1.1 x 1.7 cm. Limitations: none IMPRESSION: 1. No evidence of right lower extremity DVT. 2. Enlarged right inguinal lymph node. Electronically Signed   By: Darliss Cheney M.D.   On: 11/20/2022 16:03   DG Foot Complete Right  Result Date: 11/20/2022 CLINICAL DATA:  Wounds on both feet.  Fever.  Generalized malaise. EXAM: RIGHT FOOT COMPLETE - 3+ VIEW COMPARISON:  10/22/2021. FINDINGS: Posterior dislocation of the second toe proximal phalanx in relation to the second metatarsal head, new since the prior exam. Resorption of the head of the third metatarsal as progressed since the prior study. Margin is now smooth consistent with the sequela of osteomyelitis. There is also a smooth area of resorption along the lateral fifth metatarsal head. Lucency and sclerosis noted in midfoot with bony fragmentation, new since the prior exam. This is noted involving the navicular medial cuneiform articulations  and the medial and lateral cuneiform metatarsal articulations predominantly. 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 Diffuse surrounding soft tissue edema.  No soft tissue air. IMPRESSION: 1. Significant interval changes since the  prior right foot radiographs. 2. Posterior dislocation of the second toe at the second MTP joint. 3. Advanced arthropathic changes now noted at the midfoot. Although this may be due to diabetic neural arthropathy, septic arthropathy should be considered. 4. No other evidence of active osteomyelitis. 5. Significant diffuse soft tissue swelling. Electronically Signed   By: Amie Portland M.D.   On: 11/20/2022 15:24   DG Foot Complete Left  Result Date: 11/20/2022 CLINICAL DATA:  Wounds to both feet. Feet swelling. Fever. Generalized malaise. EXAM: LEFT FOOT - COMPLETE 3+ VIEW COMPARISON:  10/22/2021 and older exams. FINDINGS: There is significant resorption of the first metatarsal head with a concave deformity of the base of the great toe proximal phalanx. These findings are new since the prior study. There is also been resorption of the distal shaft and head of the proximal phalanx of the great toe with mild resorption at the base of the great toe distal phalanx. There is a mildly comminuted fracture across the base of the second metatarsal head, also new. There is been interval fusion of the PIP joint of the second toe. Chronic appearing resorption is noted of the distal phalanx of the third toe. There is a smooth area of resorption/remottling along the lateral head of the third metatarsal. There is significant forefoot soft tissue swelling. No soft tissue air. IMPRESSION: 1. Significant changes since the prior left foot radiographs. Resorptive changes at the first metatarsophalangeal joint is consistent with osteomyelitis, possibly a septic arthritis. Additional bone resorption of the great toe proximal phalanx and base of the distal phalanx of the great toe is also suspected to be osteomyelitis. New comminuted fracture across the head of the second metatarsal likely due to underlying osteomyelitis. Electronically Signed   By: Amie Portland M.D.   On: 11/20/2022 15:20             LOS: 2 days        Sunnie Nielsen, DO [facility] 11/22/2022, 1:10 PM    Dictation software may have been used to generate the above note. Typos may occur and escape review in typed/dictated notes. Please contact Dr Lyn Hollingshead directly for clarity if needed.  Staff may message me via secure chat in Epic  but this may not receive an immediate response,  please page me for urgent matters!  If 7PM-7AM, please contact night coverage [email or web address]

## 2022-11-22 NOTE — Anesthesia Procedure Notes (Signed)
Procedure Name: Intubation Date/Time: 11/22/2022 8:43 AM  Performed by: Hezzie Bump, CRNAPre-anesthesia Checklist: Patient identified, Patient being monitored, Timeout performed, Emergency Drugs available and Suction available Patient Re-evaluated:Patient Re-evaluated prior to induction Oxygen Delivery Method: Circle system utilized Preoxygenation: Pre-oxygenation with 100% oxygen Induction Type: IV induction Ventilation: Mask ventilation without difficulty Laryngoscope Size: McGraph and 4 Grade View: Grade I Tube type: Oral Tube size: 7.5 mm Number of attempts: 1 Airway Equipment and Method: Stylet and Video-laryngoscopy Placement Confirmation: ETT inserted through vocal cords under direct vision, positive ETCO2 and breath sounds checked- equal and bilateral Secured at: 22 cm Tube secured with: Tape Dental Injury: Teeth and Oropharynx as per pre-operative assessment  Comments: LMA Air-Q 4.5 placed. Unable to seat to obtain appropriate tidal volumes. Replaced with ETT.

## 2022-11-22 NOTE — Anesthesia Preprocedure Evaluation (Signed)
Anesthesia Evaluation  Patient identified by MRN, date of birth, ID band Patient awake    Reviewed: Allergy & Precautions, NPO status , Patient's Chart, lab work & pertinent test results  History of Anesthesia Complications Negative for: history of anesthetic complications  Airway Mallampati: III  TM Distance: >3 FB Neck ROM: Full    Dental  (+) Dental Advidsory Given   Pulmonary neg pulmonary ROS, neg shortness of breath, neg sleep apnea, neg COPD   Pulmonary exam normal breath sounds clear to auscultation       Cardiovascular Exercise Tolerance: Good METS: 3 - Mets hypertension, Pt. on medications and Pt. on home beta blockers (-) angina (-) Past MI negative cardio ROS Normal cardiovascular exam(-) dysrhythmias (-) Valvular Problems/Murmurs Rhythm:Regular Rate:Normal     Neuro/Psych negative neurological ROS  negative psych ROS   GI/Hepatic negative GI ROS, Neg liver ROS,,,  Endo/Other  diabetes, Poorly Controlled, Insulin DependentHypothyroidism    Renal/GU negative Renal ROS  negative genitourinary   Musculoskeletal negative musculoskeletal ROS (+)    Abdominal   Peds  Hematology negative hematology ROS (+)   Anesthesia Other Findings Past Medical History: No date: Cataract     Comment:  bilateral eyes No date: Diabetes mellitus without complication (HCC) No date: Heart abnormality     Comment:  Enlarged left ventricle No date: Hypertension   Reproductive/Obstetrics negative OB ROS                             Anesthesia Physical Anesthesia Plan  ASA: 3  Anesthesia Plan: General ETT   Post-op Pain Management:    Induction: Intravenous  PONV Risk Score and Plan: 2 and Propofol infusion, TIVA and Treatment may vary due to age or medical condition  Airway Management Planned: Oral ETT  Additional Equipment:   Intra-op Plan:   Post-operative Plan: Extubation in  OR  Informed Consent: I have reviewed the patients History and Physical, chart, labs and discussed the procedure including the risks, benefits and alternatives for the proposed anesthesia with the patient or authorized representative who has indicated his/her understanding and acceptance.     Dental Advisory Given  Plan Discussed with: Anesthesiologist, CRNA and Surgeon  Anesthesia Plan Comments: (Patient consented for risks of anesthesia including but not limited to:  - adverse reactions to medications - damage to eyes, teeth, lips or other oral mucosa - nerve damage due to positioning  - sore throat or hoarseness - Damage to heart, brain, nerves, lungs, other parts of body or loss of life  Patient voiced understanding.)        Anesthesia Quick Evaluation

## 2022-11-23 ENCOUNTER — Encounter: Payer: Self-pay | Admitting: Podiatry

## 2022-11-23 ENCOUNTER — Inpatient Hospital Stay (HOSPITAL_COMMUNITY)
Admit: 2022-11-23 | Discharge: 2022-11-23 | Disposition: A | Discharge status: Home / Self Care | Payer: BC Managed Care – PPO | Attending: Osteopathic Medicine | Admitting: Osteopathic Medicine

## 2022-11-23 DIAGNOSIS — E1169 Type 2 diabetes mellitus with other specified complication: Secondary | ICD-10-CM | POA: Diagnosis not present

## 2022-11-23 DIAGNOSIS — L03115 Cellulitis of right lower limb: Secondary | ICD-10-CM | POA: Diagnosis not present

## 2022-11-23 DIAGNOSIS — R7881 Bacteremia: Secondary | ICD-10-CM | POA: Diagnosis not present

## 2022-11-23 DIAGNOSIS — B9561 Methicillin susceptible Staphylococcus aureus infection as the cause of diseases classified elsewhere: Secondary | ICD-10-CM | POA: Diagnosis not present

## 2022-11-23 DIAGNOSIS — Z794 Long term (current) use of insulin: Secondary | ICD-10-CM

## 2022-11-23 DIAGNOSIS — A419 Sepsis, unspecified organism: Secondary | ICD-10-CM | POA: Diagnosis not present

## 2022-11-23 DIAGNOSIS — E11621 Type 2 diabetes mellitus with foot ulcer: Secondary | ICD-10-CM | POA: Diagnosis not present

## 2022-11-23 DIAGNOSIS — M86172 Other acute osteomyelitis, left ankle and foot: Secondary | ICD-10-CM | POA: Diagnosis not present

## 2022-11-23 LAB — ECHOCARDIOGRAM COMPLETE
AR max vel: 2.6 cm2
AV Area VTI: 2.78 cm2
AV Area mean vel: 2.56 cm2
AV Mean grad: 5 mmHg
AV Peak grad: 10.6 mmHg
Ao pk vel: 1.63 m/s
Area-P 1/2: 3.79 cm2
Height: 73 in
MV VTI: 2.51 cm2
S' Lateral: 3.3 cm
Weight: 2960 oz

## 2022-11-23 LAB — CULTURE, BLOOD (ROUTINE X 2): Special Requests: ADEQUATE

## 2022-11-23 LAB — GLUCOSE, CAPILLARY
Glucose-Capillary: 166 mg/dL — ABNORMAL HIGH (ref 70–99)
Glucose-Capillary: 271 mg/dL — ABNORMAL HIGH (ref 70–99)
Glucose-Capillary: 267 mg/dL — ABNORMAL HIGH (ref 70–99)
Glucose-Capillary: 221 mg/dL — ABNORMAL HIGH (ref 70–99)

## 2022-11-23 MED ORDER — ADULT MULTIVITAMIN W/MINERALS CH
1.0000 | ORAL_TABLET | Freq: Every day | ORAL | Status: DC
  Administered 2022-11-24 – 2022-11-27 (×3): 1 via ORAL
  Filled 2022-11-23 (×3): qty 1

## 2022-11-23 MED ORDER — ENSURE MAX PROTEIN PO LIQD
11.0000 [oz_av] | Freq: Two times a day (BID) | ORAL | Status: DC
  Filled 2022-11-23: qty 330

## 2022-11-23 MED ORDER — VITAMIN C 500 MG PO TABS
250.0000 mg | ORAL_TABLET | Freq: Two times a day (BID) | ORAL | Status: DC
  Administered 2022-11-23 – 2022-11-27 (×7): 250 mg via ORAL
  Filled 2022-11-23: qty 0.5
  Filled 2022-11-23 (×6): qty 1

## 2022-11-23 NOTE — Progress Notes (Addendum)
1 Day Post-Op   Subjective/Chief Complaint: Patient seen.  Having some pain in the left foot at a level of about 4 out of 10.  Otherwise no complaints.   Objective: Vital signs in last 24 hours: Temp:  [97.9 F (36.6 C)-98.8 F (37.1 C)] 98.2 F (36.8 C) (05/06 0809) Pulse Rate:  [64-73] 66 (05/06 0809) Resp:  [16-20] 18 (05/06 0809) BP: (125-136)/(63-78) 136/78 (05/06 0809) SpO2:  [94 %-97 %] 97 % (05/06 0809) Last BM Date : 11/22/22  Intake/Output from previous day: 05/05 0701 - 05/06 0700 In: 820 [P.O.:120; I.V.:600; IV Piggyback:100] Out: 1305 [Urine:1300; Blood:5] Intake/Output this shift: No intake/output data recorded.  Bandage on the left foot is dry and intact.  Upon removal there is some moderate bleeding from the incision site.  Edema noted in the foot but the incision is well coapted with wound edges viable.  No signs of any purulence.     Lab Results:  Recent Labs    11/20/22 1345 11/21/22 0609  WBC 19.4* 13.1*  HGB 12.3* 10.5*  HCT 37.8* 32.8*  PLT 331 348   BMET Recent Labs    11/20/22 1345 11/21/22 0609  NA 134* 138  K 3.4* 3.3*  CL 100 108  CO2 23 22  GLUCOSE 154* 131*  BUN 19 20  CREATININE 0.90 0.92  CALCIUM 8.5* 7.8*   PT/INR Recent Labs    11/20/22 1531  LABPROT 16.9*  INR 1.4*   ABG No results for input(s): "PHART", "HCO3" in the last 72 hours.  Invalid input(s): "PCO2", "PO2"  Studies/Results: No results found.  Anti-infectives: Anti-infectives (From admission, onward)    Start     Dose/Rate Route Frequency Ordered Stop   11/21/22 1400  ceFAZolin (ANCEF) IVPB 2g/100 mL premix        2 g 200 mL/hr over 30 Minutes Intravenous Every 8 hours 11/21/22 1006     11/20/22 2200  cefTRIAXone (ROCEPHIN) 2 g in sodium chloride 0.9 % 100 mL IVPB  Status:  Discontinued        2 g 200 mL/hr over 30 Minutes Intravenous Every 24 hours 11/20/22 1509 11/21/22 1006   11/20/22 2200  metroNIDAZOLE (FLAGYL) IVPB 500 mg  Status:   Discontinued        500 mg 100 mL/hr over 60 Minutes Intravenous Every 12 hours 11/20/22 1509 11/23/22 1029   11/20/22 1445  vancomycin (VANCOCIN) IVPB 1000 mg/200 mL premix  Status:  Discontinued        1,000 mg 200 mL/hr over 60 Minutes Intravenous  Once 11/20/22 1432 11/20/22 1434   11/20/22 1445  piperacillin-tazobactam (ZOSYN) IVPB 3.375 g        3.375 g 100 mL/hr over 30 Minutes Intravenous  Once 11/20/22 1432 11/20/22 1548   11/20/22 1445  vancomycin (VANCOREADY) IVPB 2000 mg/400 mL        2,000 mg 200 mL/hr over 120 Minutes Intravenous  Once 11/20/22 1434 11/20/22 1728       Assessment/Plan: s/p Procedure(s): Transmetatarsal Amuptation Left Foot (Left) Assessment: 1.  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  2.  Charcot arthropathy right foot.  Plan: Betadine gauze applied to the incision followed by a bulky sterile bandage.  Discussed with the patient that he will need to continue to remain nonweightbearing on both lower extremities.  Patient states he is anticipating on being discharged home rather than skilled nursing.  Due to bilateral nonweightbearing I think the patient would do better being at a skilled nursing facility  for the next few weeks.  If needed for transfer discussed with the patient that he could only place a little weight on the heel with the understanding that if he puts too much pressure or walks on either foot that he is at high risk for bilateral amputations.  He will keep the bandage clean dry and intact.  If still in the hospital for another few days would like to change his bandage prior to discharge.  Plan to follow-up with the patient in the clinic outpatient in approximately 1 week.  LOS: 3 days    Ricci Barker 11/23/2022

## 2022-11-23 NOTE — Evaluation (Signed)
Occupational Therapy Evaluation Patient Details Name: Alexander Gill MRN: [id number] DOB: 08/17/1955 Today's Date: 11/23/2022   History of Present Illness Pt admitted to [facility] on 07/04/21 for concern of R foot infection s/p L transmet amputation. 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 Alberteen Spindle. Significant PMH includes: DM, cataracts, HTN, hypothyroidism.   Clinical Impression   Patient agreeable to OT/PT co-treatment to maximize safety and participation. Pt presenting with decreased independence in self care, balance, functional mobility/transfers, and endurance. PTA pt lived with spouse, was independent for ADLs/IADLs, and still driving. Pt normally independent for functional mobility without an AD, however, using SPC for the past week. Pt currently functioning at supervision +2 (safety) for supine to sit, CGA-Min A +2 (safety) for lateral scoot transfer from EOB<>recliner, and Max A for LB dressing to don R camboot. Pt demonstrated difficulty maintaining LLE NWB initially, adherence improved with second trial. Pt will benefit from skilled acute OT services to address deficits noted below. OT recommends ongoing therapy upon discharge to maximize safety and independence with ADLs, decrease fall risk, decrease caregiver burden, and promote return to PLOF.     Recommendations for follow up therapy are one component of a multi-disciplinary discharge planning process, led by the attending physician.  Recommendations may be updated based on patient status, additional functional criteria and insurance authorization.   Assistance Recommended at Discharge Frequent or constant Supervision/Assistance  Patient can return home with the following A lot of help with bathing/dressing/bathroom;A lot of help with walking and/or transfers;Assistance with cooking/housework;Assist for transportation;Help with stairs or ramp for entrance    Functional Status Assessment  Patient has had a recent decline in their  functional status and demonstrates the ability to make significant improvements in function in a reasonable and predictable amount of time.  Equipment Recommendations  BSC/3in1 (drop arm BSC)    Recommendations for Other Services       Precautions / Restrictions Precautions Precautions: Fall Restrictions Weight Bearing Restrictions: Yes RLE Weight Bearing: Non weight bearing LLE Weight Bearing: Non weight bearing Other Position/Activity Restrictions: per pt, MD stated he could use his R foot in cam boot for "one step"      Mobility Bed Mobility Overal bed mobility: Needs Assistance Bed Mobility: Supine to Sit     Supine to sit: Supervision, +2 for safety/equipment          Transfers Overall transfer level: Needs assistance Equipment used: None Transfers: Bed to chair/wheelchair/BSC            Lateral/Scoot Transfers: Min guard, Min assist, +2 safety/equipment General transfer comment: VC for hand placement and safe technique, difficulty maintaining LLE NWB initally      Balance Overall balance assessment: Needs assistance Sitting-balance support: Feet supported Sitting balance-Leahy Scale: Good         ADL either performed or assessed with clinical judgement   ADL Overall ADL's : Needs assistance/impaired     Lower Body Dressing: Maximal assistance;Sitting/lateral leans Lower Body Dressing Details (indicate cue type and reason): to don R camboot Toilet Transfer: Min guard;Minimal assistance;+2 for safety/equipment;Requires drop arm;Cueing for sequencing;Cueing for safety Toilet Transfer Details (indicate cue type and reason): simulated via lateral scoot transfer       Vision Baseline Vision/History: 1 Wears glasses Patient Visual Report: No change from baseline       Perception     Praxis      Pertinent Vitals/Pain Pain Assessment Pain Assessment: Faces Faces Pain Scale: Hurts a little bit Pain Location: L  foot with mobility Pain Descriptors  / Indicators: Sore Pain Intervention(s): Limited activity within patient's tolerance, Monitored during session, Premedicated before session, Repositioned     Hand Dominance Right   Extremity/Trunk Assessment Upper Extremity Assessment Upper Extremity Assessment: Overall WFL for tasks assessed   Lower Extremity Assessment Lower Extremity Assessment: Generalized weakness   Cervical / Trunk Assessment Cervical / Trunk Assessment: Normal   Communication Communication Communication: No difficulties   Cognition Arousal/Alertness: Awake/alert Behavior During Therapy: WFL for tasks assessed/performed Overall Cognitive Status: Within Functional Limits for tasks assessed       General Comments       Exercises Other Exercises Other Exercises: OT provided education re: role of OT, OT POC, post acute recs, sitting up for all meals, EOB/OOB mobility with assistance, home/fall safety, BLE NWB, how to don/doff R camboot, LB dressing AE, use of drop arm BSC   Shoulder Instructions      Home Living Family/patient expects to be discharged to:: Private residence Living Arrangements: Spouse/significant other Available Help at Discharge: Family;Available 24 hours/day Type of Home: House Home Access: Stairs to enter [employer] of Steps: 1 Entrance Stairs-Rails: None Home Layout: One level     Bathroom Shower/Tub: Producer, television/film/video: Standard     Home Equipment: Agricultural consultant (2 wheels);Cane - single point;Other (comment) (knee scooter)          Prior Functioning/Environment Prior Level of Function : Independent/Modified Independent;Driving;History of Falls (last six months)             Mobility Comments: Normally IND without an AD, started using SPC ~1 week ago ADLs Comments: Pt reports independent for ADLs, shared responsbility for IADLs with sposue.        OT Problem List: Decreased strength;Decreased range of motion;Decreased activity  tolerance;Impaired balance (sitting and/or standing);Decreased knowledge of use of DME or AE;Decreased knowledge of precautions;Pain      OT Treatment/Interventions: Self-care/ADL training;Therapeutic exercise;Energy conservation;DME and/or AE instruction;Therapeutic activities;Patient/family education;Balance training    OT Goals(Current goals can be found in the care plan section) Acute Rehab OT Goals Patient Stated Goal: return to PLOF OT Goal Formulation: With patient Time For Goal Achievement: 12/07/22 Potential to Achieve Goals: Good   OT Frequency: Min 3X/week    Co-evaluation PT/OT/SLP Co-Evaluation/Treatment: Yes Reason for Co-Treatment: To address functional/ADL transfers PT goals addressed during session: Mobility/safety with mobility;Balance OT goals addressed during session: Proper use of Adaptive equipment and DME;Strengthening/ROM      AM-PAC OT "6 Clicks" Daily Activity     Outcome Measure Help from another person eating meals?: None Help from another person taking care of personal grooming?: A Little Help from another person toileting, which includes using toliet, bedpan, or urinal?: A Lot Help from another person bathing (including washing, rinsing, drying)?: A Lot Help from another person to put on and taking off regular upper body clothing?: A Little Help from another person to put on and taking off regular lower body clothing?: A Lot 6 Click Score: 16   End of Session Nurse Communication: Mobility status;Precautions;Weight bearing status  Activity Tolerance: Patient tolerated treatment well Patient left: in chair;with call bell/phone within reach;with chair alarm set  OT Visit Diagnosis: Other abnormalities of gait and mobility (R26.89);Muscle weakness (generalized) (M62.81);Pain Pain - Right/Left: Right Pain - part of body: Ankle and joints of foot                Time: 1610-9604 OT Time Calculation (min): 28 min Charges:  OT General  Charges $OT Visit: 1  Visit OT Evaluation $OT Eval Low Complexity: 1 Low  [facility] MS, OTR/L ascom [phone]  11/23/22, 1:53 PM

## 2022-11-23 NOTE — Progress Notes (Signed)
PROGRESS NOTE    Alexander Gill   ZOX:[id number] DOB: July 19, 1956  DOA: 11/20/2022 Date of Service: 11/23/22 PCP: [facility]     Brief Narrative / Hospital Course:  Alexander Gill is a 67 y.o. 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  05/03:  WBC 19.4, GFR> 60, potassium 3.4, temperature 101.5, blood pressure 120/62, heart rate 93, RR 20, oxygen saturation 99% on room air.  Patient is admitted to MedSurg for psteomyelitis, cellulitis w/ abscess L foot.  Dr. Alberteen Spindle for podiatry was consulted. L foot abscess cultured. Plan surgery tomorrow for amputation of the second ray with potentially the first ray as well.  05/04: surgery was on schedule but had to be deferred d/t power outage in the hospital. Pt stable. WBC trending down. (+)BCx gram pos cocci - ID panel (+)staph --> abx narrowed to cefazolin  05/05: Underwent Transmetatarsal amputation left foot w/ Dr Alberteen Spindle. Repeat BCx drawn. Plan ID consult tomorrow to guide further IV abx, awaiting surgical culture/pathology. Pt is NWB, will need PT and possible SNF/STR. 05/06: awaiting susceptibilities from initial cultures. Repeat cultures NG thus far. Echo ordered. Will need TEE. ID on board.   Consultants:  Podiatry   Procedures: none      ASSESSMENT & PLAN:   Principal Problem:   Osteomyelitis of left foot (HCC) Active Problems:   Cellulitis of right lower extremity   Sepsis (HCC)   Type 2 diabetes mellitus with diabetic foot ulcer (HCC)   Hypertension   HLD (hyperlipidemia)   Hypothyroidism   Hypokalemia     Sepsis due to left foot osteomyelitis and cellulitis of right lower extremity Staph bacteremia  Sepsis parameters have resolved  Abx: 05/03 Zosyn in ED --> 05/03 vancomycin, Flagyl, Rocephin --(pos BCx 05/04)-->  cefazolin  continue on cefazolin 2gm iv q 8hr. If blood cx NGTD in the next 24hr, can place picc line  TTE ordered, will need TTE PRN Zofran for nausea tylenol and oxycodone for pain Underwent Transmetatarsal amputation left foot w/ Dr Alberteen Spindle 11/22/22.  Plan ID consult tomorrow to guide further IV abx, awaiting surgical culture/pathology.  Pt is NWB, will need PT and possible SNF/STR. Repeat BCx drawn 05/05   MRSA colonization mupirocin bid plus chg bathing x 5 days   Type 2 diabetes mellitus with diabetic foot ulcer (HCC):  Recent A1c 8.2, poorly controlled.   Patient is taking Xigduo XR, semaglutide and glargine insulin 36 units daily Glargine insulin 24 Units daily  SSI   Hypertension: Hold home blood pressure medications: Atenolol, Hyzaar since patient at risk of developing hypotension due to sepsis IV hydralazine as needed   HLD (hyperlipidemia) Crestor   Hypothyroidism Synthroid   Hypokalemia Replace as needed Monitor BMP       DVT prophylaxis: heparin  Pertinent IV fluids/nutrition: NPO pending surgery, IV fluids per orders  Central lines / invasive devices: none  Code Status: FULL CODE ACP documentation reviewed: 05/04 none on file   Current Admission Status: inpatient   TOC needs / Dispo plan: expect may need HH/SNF, PT/OT to eval following surgery  Barriers to discharge / significant pending items:  PT/OT , await BCx. Needs TTE and TEE and likely long term IV abx, expect possible d/c by end of this week if cultured neg and no vegetations  Subjective / Brief ROS:  Patient reports doing well Denies CP/SOB.  Pain controlled.    Family Communication: none at this time     Objective Findings:  Vitals:   11/22/22 1602 11/22/22 2001 11/23/22 0334 11/23/22 0809  BP: 131/70 125/71 130/63 136/78  Pulse: 64 73 69 66  Resp: 18 16 20 18   Temp: 97.9 F (36.6 C) 98.4 F (36.9 C) 98.8 F (37.1 C) 98.2 F (36.8 C)  TempSrc:  Oral  Oral   SpO2: 96% 95% 94% 97%  Weight:      Height:        Intake/Output Summary (Last 24 hours) at 11/23/2022 1305 Last data filed at 11/23/2022 0411 Gross per 24 hour  Intake 120 ml  Output 1300 ml  Net -1180 ml   Filed Weights   11/20/22 1339  Weight: 83.9 kg    Examination:  Physical Exam Constitutional:      General: He is not in acute distress. Cardiovascular:     Rate and Rhythm: Normal rate and regular rhythm.  Pulmonary:     Effort: Pulmonary effort is normal.     Breath sounds: Normal breath sounds.  Neurological:     General: No focal deficit present.     Mental Status: He is alert.  Psychiatric:        Mood and Affect: Mood normal.        Behavior: Behavior normal.          Scheduled Medications:   aspirin EC  81 mg Oral Daily   cholecalciferol  1,000 Units Oral Daily   heparin  5,000 Units Subcutaneous Q8H   insulin aspart  0-5 Units Subcutaneous QHS   insulin aspart  0-9 Units Subcutaneous TID WC   insulin glargine-yfgn  24 Units Subcutaneous QHS   levothyroxine  25 mcg Oral Daily   rosuvastatin  20 mg Oral Daily    Continuous Infusions:   ceFAZolin (ANCEF) IV 2 g (11/23/22 0516)    PRN Medications:  acetaminophen, hydrALAZINE, morphine injection, ondansetron (ZOFRAN) IV, oxyCODONE-acetaminophen  Antimicrobials from admission:  Anti-infectives (From admission, onward)    Start     Dose/Rate Route Frequency Ordered Stop   11/21/22 1400  ceFAZolin (ANCEF) IVPB 2g/100 mL premix        2 g 200 mL/hr over 30 Minutes Intravenous Every 8 hours 11/21/22 1006     11/20/22 2200  cefTRIAXone (ROCEPHIN) 2 g in sodium chloride 0.9 % 100 mL IVPB  Status:  Discontinued        2 g 200 mL/hr over 30 Minutes Intravenous Every 24 hours 11/20/22 1509 11/21/22 1006   11/20/22 2200  metroNIDAZOLE (FLAGYL) IVPB 500 mg  Status:  Discontinued        500 mg 100 mL/hr over 60 Minutes Intravenous Every 12 hours 11/20/22 1509 11/23/22 1029   11/20/22 1445   vancomycin (VANCOCIN) IVPB 1000 mg/200 mL premix  Status:  Discontinued        1,000 mg 200 mL/hr over 60 Minutes Intravenous  Once 11/20/22 1432 11/20/22 1434   11/20/22 1445  piperacillin-tazobactam (ZOSYN) IVPB 3.375 g        3.375 g 100 mL/hr over 30 Minutes Intravenous  Once 11/20/22 1432 11/20/22 1548   11/20/22 1445  vancomycin (VANCOREADY) IVPB 2000 mg/400 mL        2,000 mg 200 mL/hr over 120 Minutes Intravenous  Once 11/20/22 1434 11/20/22 1728           Data Reviewed:  I have personally reviewed the following...  CBC: Recent Labs  Lab 11/20/22 1345 11/21/22 0609  WBC 19.4* 13.1*  NEUTROABS 17.9*  --   HGB 12.3* 10.5*  HCT 37.8* 32.8*  MCV 85.5 87.5  PLT 331 348   Basic Metabolic Panel: Recent Labs  Lab 11/20/22 1345 11/21/22 0609  NA 134* 138  K 3.4* 3.3*  CL 100 108  CO2 23 22  GLUCOSE 154* 131*  BUN 19 20  CREATININE 0.90 0.92  CALCIUM 8.5* 7.8*  MG 2.0  --    GFR: Estimated Creatinine Clearance: 90.5 mL/min (by C-G formula based on SCr of 0.92 mg/dL). Liver Function Tests: Recent Labs  Lab 11/20/22 1345  AST 18  ALT 14  ALKPHOS 105  BILITOT 1.2  PROT 7.7  ALBUMIN 2.6*   No results for input(s): "LIPASE", "AMYLASE" in the last 168 hours. No results for input(s): "AMMONIA" in the last 168 hours. Coagulation Profile: Recent Labs  Lab 11/20/22 1531  INR 1.4*   Cardiac Enzymes: No results for input(s): "CKTOTAL", "CKMB", "CKMBINDEX", "TROPONINI" in the last 168 hours. BNP (last 3 results) No results for input(s): "PROBNP" in the last 8760 hours. HbA1C: No results for input(s): "HGBA1C" in the last 72 hours. CBG: Recent Labs  Lab 11/22/22 1121 11/22/22 1641 11/22/22 2139 11/23/22 0829 11/23/22 1149  GLUCAP 160* 282* 240* 166* 271*   Lipid Profile: No results for input(s): "CHOL", "HDL", "LDLCALC", "TRIG", "CHOLHDL", "LDLDIRECT" in the last 72 hours. Thyroid Function Tests: No results for input(s): "TSH", "T4TOTAL",  "FREET4", "T3FREE", "THYROIDAB" in the last 72 hours. Anemia Panel: No results for input(s): "VITAMINB12", "FOLATE", "FERRITIN", "TIBC", "IRON", "RETICCTPCT" in the last 72 hours. Most Recent Urinalysis On File:  No results found for: "COLORURINE", "APPEARANCEUR", "LABSPEC", "PHURINE", "GLUCOSEU", "HGBUR", "BILIRUBINUR", "KETONESUR", "PROTEINUR", "UROBILINOGEN", "NITRITE", "LEUKOCYTESUR" Sepsis Labs: @LABRCNTIP (procalcitonin:4,lacticidven:4) Microbiology: Recent Results (from the past 240 hour(s))  Blood culture (routine x 2)     Status: Abnormal (Preliminary result)   Collection Time: 11/20/22  2:24 PM   Specimen: BLOOD RIGHT FOREARM  Result Value Ref Range Status   Specimen Description   Final    BLOOD RIGHT FOREARM Performed at [facility], [address], [place], [place] [address]    Special Requests   Final    BOTTLES DRAWN AEROBIC AND ANAEROBIC Blood Culture results may not be optimal due to an excessive volume of blood received in culture bottles Performed at [facility], [address] [place], [place] [address]    Culture  Setup Time   Final    GRAM POSITIVE COCCI IN BOTH AEROBIC AND ANAEROBIC BOTTLES CRITICAL RESULT CALLED TO, READ BACK BY AND VERIFIED WITH: NATHAN BELUE AT 0601 11/21/22.PMF Performed at [facility], [address] [place], [place] [address]    Culture STAPHYLOCOCCUS AUREUS (A)  Final   Report Status PENDING  Incomplete  Blood culture (routine x 2)     Status: Abnormal (Preliminary result)   Collection Time: 11/20/22  2:49 PM   Specimen: BLOOD LEFT FOREARM  Result Value Ref Range Status   Specimen Description   Final    BLOOD LEFT FOREARM Performed at [facility], [address], [place], [place] [address]    Special Requests   Final    BOTTLES DRAWN AEROBIC AND ANAEROBIC Blood Culture results may not be optimal due to an excessive volume of blood received in culture bottles Performed at [facility], [address] [place], [place] [address]  Culture  Setup Time   Final    Organism ID to follow GRAM POSITIVE COCCI AEROBIC BOTTLE ONLY CRITICAL RESULT CALLED TO, READ BACK BY AND VERIFIED WITHDawayne Cirri @ [phone] 11/21/22 [facility] Performed at [facility], [address], [place], [place] [address]    Culture (A)  Final    STAPHYLOCOCCUS AUREUS CONFIRMATION OF SUSCEPTIBILITIES IN PROGRESS Performed at [facility], [address] [place], [place] [address]    Report Status PENDING  Incomplete  Blood Culture ID Panel (Reflexed)     Status: Abnormal   Collection Time: 11/20/22  2:49 PM  Result Value Ref Range Status   Enterococcus faecalis NOT DETECTED NOT DETECTED Final   Enterococcus Faecium NOT DETECTED NOT DETECTED Final   Listeria monocytogenes NOT DETECTED NOT DETECTED Final   Staphylococcus species DETECTED (A) NOT DETECTED Final    Comment: CRITICAL RESULT CALLED TO, READ BACK BY AND VERIFIED WITH: NATHAN BELUE @ 0440 11/21/22 [facility]    Staphylococcus aureus (BCID) DETECTED (A) NOT DETECTED Final    Comment: CRITICAL RESULT CALLED TO, READ BACK BY AND VERIFIED WITH: NATHAN BELUE @ 0440 11/21/22 [facility]    Staphylococcus epidermidis NOT DETECTED NOT DETECTED Final   Staphylococcus lugdunensis NOT DETECTED NOT DETECTED Final   Streptococcus species NOT DETECTED NOT DETECTED Final   Streptococcus agalactiae NOT DETECTED NOT DETECTED Final   Streptococcus pneumoniae NOT DETECTED NOT DETECTED Final   Streptococcus pyogenes NOT DETECTED NOT DETECTED Final   A.calcoaceticus-baumannii NOT DETECTED NOT DETECTED Final   Bacteroides fragilis NOT DETECTED NOT DETECTED Final   Enterobacterales NOT DETECTED NOT DETECTED Final   Enterobacter cloacae complex NOT DETECTED NOT DETECTED Final   Escherichia coli NOT DETECTED NOT DETECTED Final   Klebsiella aerogenes NOT DETECTED NOT DETECTED Final   Klebsiella oxytoca NOT DETECTED NOT DETECTED Final   Klebsiella pneumoniae NOT DETECTED  NOT DETECTED Final   Proteus species NOT DETECTED NOT DETECTED Final   Salmonella species NOT DETECTED NOT DETECTED Final   Serratia marcescens NOT DETECTED NOT DETECTED Final   Haemophilus influenzae NOT DETECTED NOT DETECTED Final   Neisseria meningitidis NOT DETECTED NOT DETECTED Final   Pseudomonas aeruginosa NOT DETECTED NOT DETECTED Final   Stenotrophomonas maltophilia NOT DETECTED NOT DETECTED Final   Candida albicans NOT DETECTED NOT DETECTED Final   Candida auris NOT DETECTED NOT DETECTED Final   Candida glabrata NOT DETECTED NOT DETECTED Final   Candida krusei NOT DETECTED NOT DETECTED Final   Candida parapsilosis NOT DETECTED NOT DETECTED Final   Candida tropicalis NOT DETECTED NOT DETECTED Final   Cryptococcus neoformans/gattii NOT DETECTED NOT DETECTED Final   Meth resistant mecA/C and MREJ NOT DETECTED NOT DETECTED Final    Comment: Performed at [facility], [address] [place], [place] [address]  Aerobic/Anaerobic Culture w Gram Stain (surgical/deep wound)     Status: None (Preliminary result)   Collection Time: 11/20/22  5:51 PM   Specimen: Wound  Result Value Ref Range Status   Specimen Description   Final    WOUND Performed at [facility], [address], [place], [place] [address]    Special Requests   Final    LEFT FOOT Performed at [facility], [address] [place], [place] [address]    Gram Stain   Final    RARE WBC PRESENT, PREDOMINANTLY PMN NO ORGANISMS SEEN    Culture   Final    ABUNDANT STAPHYLOCOCCUS AUREUS SUSCEPTIBILITIES TO FOLLOW Performed at [facility], [address] [place], [place] [address]    Report Status PENDING  Incomplete  Aerobic/Anaerobic Culture w Gram Stain (surgical/deep wound)     Status: None (Preliminary result)   Collection Time: 11/22/22  9:11 AM   Specimen: PATH Bone biopsy; Tissue  Result Value Ref Range Status   Specimen Description   Final    BONE Performed at [facility], [address], [place], [place] [address]    Special Requests   Final    NONE Performed at [facility], [address] [place], [place] [address]    Gram Stain   Final    FEW WBC PRESENT,BOTH PMN AND MONONUCLEAR RARE GRAM POSITIVE COCCI Performed at [facility], [address] [place], [place] [address]    Culture MODERATE STAPHYLOCOCCUS AUREUS  Final   Report Status PENDING  Incomplete  Culture, blood (Routine X 2) w Reflex to ID Panel     Status: None (Preliminary result)   Collection Time: 11/22/22 11:59 AM   Specimen: BLOOD RIGHT HAND  Result Value Ref Range Status   Specimen Description BLOOD RIGHT HAND  Final   Special Requests   Final    BOTTLES DRAWN AEROBIC AND ANAEROBIC Blood Culture adequate volume   Culture   Final    NO GROWTH < 24 HOURS Performed at [facility], [address], [place], [place] [address]    Report Status PENDING  Incomplete  Culture, blood (Routine X 2) w Reflex to ID Panel     Status: None (Preliminary result)   Collection Time: 11/22/22 12:00 PM   Specimen: BLOOD  Result Value Ref Range Status   Specimen Description BLOOD RIGHT ANTECUBITAL  Final   Special Requests   Final    BOTTLES DRAWN AEROBIC AND ANAEROBIC Blood Culture adequate volume   Culture   Final    NO GROWTH < 24 HOURS Performed at [facility], [address] [place], [place] [address]    Report Status PENDING  Incomplete      Radiology Studies last 3 days: MR FOOT LEFT WO CONTRAST  Result Date: 11/21/2022 CLINICAL DATA:  Foot swelling, diabetic, osteomyelitis suspected. EXAM: MRI OF THE LEFT FOOT WITHOUT CONTRAST TECHNIQUE: Multiplanar, multisequence MR imaging of the left forefoot was performed. No intravenous contrast was administered. COMPARISON:  Radiographs dated Nov 20, 2022 FINDINGS: Bones/Joint/Cartilage Osseous erosion at the first metatarsophalangeal joint with bone marrow edema of the distal half of the first  metatarsal and residual proximal phalanx. There is also edema and erosion of the distal phalanx of the first digit. There is bone marrow edema and osseous erosion at the second metatarsal head and proximal phalanx of the second digit suggesting pathological fracture with joint effusion. There is also marrow edema of the proximal phalanx of the third digit which in the presence of adjacent infectious/inflammatory process is consistent with osteomyelitis. Ligaments Evaluation of ligaments at the first and second metatarsophalangeal joints is limited secondary to ongoing chronic infectious/inflammatory process. Muscles and Tendons Flexor and extensor tendons appear intact. Increased signal of the plantar muscles suggesting myopathy/myositis. Soft tissues There is a fluid collection about the second metatarsophalangeal joint measuring at least 2.3 x 1.3 x 3.1 cm with an open wound about dorsal aspect of the foot and marked surrounding inflammatory changes consistent with an abscess. There is also deep skin wound and edema and inflammatory changes about the first metatarsophalangeal joint suggesting chronic infectious/inflammatory process/abscess IMPRESSION: 1. Osteomyelitis of  the distal half of the first metatarsal and residual proximal phalanx of the first digit, likely secondary to ongoing osteomyelitis. 2. Abnormal marrow signal of the second metatarsal head and proximal phalanx of the second digit with fracture of the second metatarsal head, likely sequela of osteomyelitis with septic arthritis. 3. Osteomyelitis of the proximal phalanx of the third digit. 4. 2.3 x 1.3 x 3.1 cm fluid collection about the second metatarsophalangeal joint with open wound about the dorsal aspect of the foot marked surrounding inflammatory changes consistent with abscess. 5. Deep skin wound and edema and inflammatory changes about the first metatarsophalangeal joint suggesting chronic infectious/inflammatory process/abscess.  Electronically Signed   By: Larose Hires D.O.   On: 11/21/2022 09:39   US Venous Img Lower Unilateral Right  Result Date: 11/20/2022 CLINICAL DATA:  Swelling EXAM: RIGHT LOWER EXTREMITY VENOUS DOPPLER ULTRASOUND TECHNIQUE: Gray-scale sonography with compression, as well as color and duplex ultrasound, were performed to evaluate the deep venous system(s) from the level of the common femoral vein through the popliteal and proximal calf veins. COMPARISON:  None Available. FINDINGS: VENOUS Normal compressibility of the common femoral, superficial femoral, and popliteal veins, as well as the visualized calf veins. Visualized portions of profunda femoral vein and great saphenous vein unremarkable. No filling defects to suggest DVT on grayscale or color Doppler imaging. Doppler waveforms show normal direction of venous flow, normal respiratory plasticity and response to augmentation. Limited views of the contralateral common femoral vein are unremarkable. OTHER Enlarged right inguinal lymph node is present measuring 2.1 x 1.1 x 1.7 cm. Limitations: none IMPRESSION: 1. No evidence of right lower extremity DVT. 2. Enlarged right inguinal lymph node. Electronically Signed   By: Darliss Cheney M.D.   On: 11/20/2022 16:03   DG Foot Complete Right  Result Date: 11/20/2022 CLINICAL DATA:  Wounds on both feet.  Fever.  Generalized malaise. EXAM: RIGHT FOOT COMPLETE - 3+ VIEW COMPARISON:  10/22/2021. FINDINGS: Posterior dislocation of the second toe proximal phalanx in relation to the second metatarsal head, new since the prior exam. Resorption of the head of the third metatarsal as progressed since the prior study. Margin is now smooth consistent with the sequela of osteomyelitis. There is also a smooth area of resorption along the lateral fifth metatarsal head. Lucency and sclerosis noted in midfoot with bony fragmentation, new since the prior exam. This is noted involving the navicular medial cuneiform articulations and the  medial and lateral cuneiform metatarsal articulations predominantly. 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 Diffuse surrounding soft tissue edema.  No soft tissue air. IMPRESSION: 1. Significant interval changes since the prior right foot radiographs. 2. Posterior dislocation of the second toe at the second MTP joint. 3. Advanced arthropathic changes now noted at the midfoot. Although this may be due to diabetic neural arthropathy, septic arthropathy should be considered. 4. No other evidence of active osteomyelitis. 5. Significant diffuse soft tissue swelling. Electronically Signed   By: Amie Portland M.D.   On: 11/20/2022 15:24   DG Foot Complete Left  Result Date: 11/20/2022 CLINICAL DATA:  Wounds to both feet. Feet swelling. Fever. Generalized malaise. EXAM: LEFT FOOT - COMPLETE 3+ VIEW COMPARISON:  10/22/2021 and older exams. FINDINGS: There is significant resorption of the first metatarsal head with a concave deformity of the base of the great toe proximal phalanx. These findings are new since the prior study. There is also been resorption of the distal shaft and head of the proximal phalanx of the great toe  with mild resorption at the base of the great toe distal phalanx. There is a mildly comminuted fracture across the base of the second metatarsal head, also new. There is been interval fusion of the PIP joint of the second toe. Chronic appearing resorption is noted of the distal phalanx of the third toe. There is a smooth area of resorption/remottling along the lateral head of the third metatarsal. There is significant forefoot soft tissue swelling. No soft tissue air. IMPRESSION: 1. Significant changes since the prior left foot radiographs. Resorptive changes at the first metatarsophalangeal joint is consistent with osteomyelitis, possibly a septic arthritis. Additional bone resorption of the great toe proximal phalanx and base of the distal phalanx of the great toe is also suspected to be  osteomyelitis. New comminuted fracture across the head of the second metatarsal likely due to underlying osteomyelitis. Electronically Signed   By: Amie Portland M.D.   On: 11/20/2022 15:20             LOS: 3 days       Sunnie Nielsen, DO [facility] 11/23/2022, 1:05 PM    Dictation software may have been used to generate the above note. Typos may occur and escape review in typed/dictated notes. Please contact Dr Lyn Hollingshead directly for clarity if needed.  Staff may message me via secure chat in Epic  but this may not receive an immediate response,  please page me for urgent matters!  If 7PM-7AM, please contact night coverage [email or web address]

## 2022-11-23 NOTE — Progress Notes (Signed)
*  PRELIMINARY RESULTS* Echocardiogram 2D Echocardiogram has been performed.  Alexander Gill 11/23/2022, 4:27 PM

## 2022-11-23 NOTE — Progress Notes (Signed)
[facility]    Date of Admission:  11/20/2022   Total days of antibiotics 4   ID: Alexander Gill is a 67 y.o. male with  MSSA bacteremia with right foot cellulitis and left foot OM s/p TMA Principal Problem:   Osteomyelitis of left foot (HCC) Active Problems:   Hypertension   HLD (hyperlipidemia)   Hypothyroidism   Cellulitis of right lower extremity   Sepsis (HCC)   Type 2 diabetes mellitus with diabetic foot ulcer (HCC)   Hypokalemia    Subjective: Afebrile. Still some pain associated to left TMA site. Has some redness to left PIV site, mild tenderness  Medications:   aspirin EC  81 mg Oral Daily   cholecalciferol  1,000 Units Oral Daily   heparin  5,000 Units Subcutaneous Q8H   insulin aspart  0-5 Units Subcutaneous QHS   insulin aspart  0-9 Units Subcutaneous TID WC   insulin glargine-yfgn  24 Units Subcutaneous QHS   levothyroxine  25 mcg Oral Daily   rosuvastatin  20 mg Oral Daily    Objective: Vital signs in last 24 hours: Temp:  [97.9 F (36.6 C)-99.1 F (37.3 C)] 98.2 F (36.8 C) (05/06 0809) Pulse Rate:  [55-73] 66 (05/06 0809) Resp:  [16-20] 18 (05/06 0809) BP: (125-136)/(63-78) 136/78 (05/06 0809) SpO2:  [94 %-100 %] 97 % (05/06 0809)  Physical Exam  Constitutional: He is oriented to person, place, and time. He appears well-developed and well-nourished. No distress.  HENT:  Mouth/Throat: Oropharynx is clear and moist. No oropharyngeal exudate.  Cardiovascular: Normal rate, regular rhythm and normal heart sounds. Exam reveals no gallop and no friction rub.  No murmur heard.  Pulmonary/Chest: Effort normal and breath sounds normal. No respiratory distress. He has no wheezes.  Abdominal: Soft. Bowel sounds are normal. He exhibits no distension. There is no tenderness.  Lymphadenopathy:  He has no cervical adenopathy.  Neurological: He is alert and oriented to person, place, and time.  Skin: Skin is warm and dry. +pitting edema  and slight blanching erythema to right foot. Left foot wrapped from TMA surgery. Left arm piv mild infiltrated with induration Psychiatric: He has a normal mood and affect. His behavior is normal.    Lab Results Recent Labs    11/20/22 1345 11/21/22 0609  WBC 19.4* 13.1*  HGB 12.3* 10.5*  HCT 37.8* 32.8*  NA 134* 138  K 3.4* 3.3*  CL 100 108  CO2 23 22  BUN 19 20  CREATININE 0.90 0.92   Liver Panel Recent Labs    11/20/22 1345  PROT 7.7  ALBUMIN 2.6*  AST 18  ALT 14  ALKPHOS 105  BILITOT 1.2   Sedimentation Rate Recent Labs    11/20/22 1345  ESRSEDRATE 100*   C-Reactive Protein Recent Labs    11/20/22 1531  CRP 25.1*    Microbiology: 5/5 blood cx NGTD 5/3 blood cx MSSA  MRSA colonized Studies/Results: No results found.   Assessment/Plan: MSSA bacteremia likely from MSSA left foot osteo + right foot cellulitis= continue on cefazolin 2gm iv q 8hr. If blood cx NGTD in the next 24hr, can place picc line  Still needs TEE to evaluate for endocarditis. Denies back pain thus no back imaging needed at this time  Piv infiltrate of left arm = remove and treat with hot packs  Mrsa colonization = would continue with mupirocin bid plus chg bathing x 5 days  Left foot osteomyelitis = cx showing GPC likely to  be MSSA. Will follow cx. Also has source control now  Dr Rivka Safer to follow up tomorrow.  I have personally spent 52 minutes involved in face-to-face and non-face-to-face activities for this patient on the day of the visit. Professional time spent includes the following activities: Preparing to see the patient (review of tests), Obtaining and/or reviewing separately obtained history (admission/discharge record), Performing a medically appropriate examination and/or evaluation , Ordering medications/tests/procedures, referring and communicating with other health care professionals, Documenting clinical information in the EMR, Independently interpreting results  (not separately reported), Communicating results to the patient/family/caregiver, Counseling and educating the patient/family/caregiver and Care coordination (not separately reported).     [facility] Pager: [phone]  11/23/2022, 11:18 AM

## 2022-11-23 NOTE — Evaluation (Signed)
Physical Therapy Evaluation Patient Details Name: Alexander Gill MRN: [id number] DOB: 1956-02-03 Today's Date: 11/23/2022  History of Present Illness  Pt admitted to [facility] on 07/04/21 for concern of R foot infection s/p L transmet amputation. 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 Alberteen Spindle. Significant PMH includes: DM, cataracts, HTN, hypothyroidism.   Clinical Impression  Patient alert, agreeable to PT/OT, very motivated. At baseline pt is independent but was using a cane the last week. Upon assessment he was able to perform bed mobility with CGA and bed rails. Lateral scoot bed <> chair multiple times, with a rest break in between. Pt did not maintain LLE NWB with first attempt, with CGA-minA for LLE able to maintain with repetitions.  Overall the patient demonstrated deficits (see "PT Problem List") that impede the patient's functional abilities, safety, and mobility and would benefit from skilled PT intervention. Recommendation is to maximize therapy services (<3hours a day) to improve pt independence and function.       Recommendations for follow up therapy are one component of a multi-disciplinary discharge planning process, led by the attending physician.  Recommendations may be updated based on patient status, additional functional criteria and insurance authorization.  Follow Up Recommendations Can patient physically be transported by private vehicle: No     Assistance Recommended at Discharge Frequent or constant Supervision/Assistance  Patient can return home with the following  A lot of help with bathing/dressing/bathroom;Assistance with cooking/housework;Direct supervision/assist for medications management;Assist for transportation;Help with stairs or ramp for entrance;A lot of help with walking and/or transfers    Equipment Recommendations Wheelchair (measurements PT);Wheelchair cushion (measurements PT) (slideboard, articulating leg rests)  Recommendations for Other Services        Functional Status Assessment Patient has had a recent decline in their functional status and demonstrates the ability to make significant improvements in function in a reasonable and predictable amount of time.     Precautions / Restrictions Precautions Precautions: Fall Restrictions Weight Bearing Restrictions: Yes RLE Weight Bearing: Non weight bearing LLE Weight Bearing: Non weight bearing Other Position/Activity Restrictions: per pt, MD stated he could use his R foot in cam boot for "one step"      Mobility  Bed Mobility Overal bed mobility: Needs Assistance Bed Mobility: Supine to Sit     Supine to sit: Supervision, +2 for safety/equipment          Transfers Overall transfer level: Needs assistance   Transfers: Bed to chair/wheelchair/BSC            Lateral/Scoot Transfers: Min guard, Min assist, +2 safety/equipment      Ambulation/Gait                  Stairs            Wheelchair Mobility    Modified Rankin (Stroke Patients Only)       Balance Overall balance assessment: Needs assistance Sitting-balance support: Feet supported Sitting balance-Leahy Scale: Good         Standing balance comment: unable, NWB bilaterally                             Pertinent Vitals/Pain Pain Assessment Pain Assessment: Faces Faces Pain Scale: Hurts a little bit Pain Location: L foot with mobility Pain Descriptors / Indicators: Sore Pain Intervention(s): Limited activity within patient's tolerance, Monitored during session, Premedicated before session, Repositioned    Home Living Family/patient expects to be discharged to:: Private residence  Living Arrangements: Spouse/significant other Available Help at Discharge: Family;Available 24 hours/day Type of Home: House Home Access: Stairs to enter Entrance Stairs-Rails: None Entrance Stairs-Number of Steps: 1   Home Layout: One level Home Equipment: Agricultural consultant (2  wheels);Cane - single point (knee scooter)      Prior Function Prior Level of Function : Independent/Modified Independent                     Hand Dominance        Extremity/Trunk Assessment   Upper Extremity Assessment Upper Extremity Assessment: Overall WFL for tasks assessed    Lower Extremity Assessment Lower Extremity Assessment: Generalized weakness    Cervical / Trunk Assessment Cervical / Trunk Assessment: Normal  Communication      Cognition Arousal/Alertness: Awake/alert Behavior During Therapy: WFL for tasks assessed/performed Overall Cognitive Status: Within Functional Limits for tasks assessed                                          General Comments      Exercises     Assessment/Plan    PT Assessment Patient needs continued PT services  PT Problem List Decreased strength;Decreased mobility;Decreased range of motion;Decreased knowledge of precautions;Decreased activity tolerance;Decreased balance;Pain;Decreased knowledge of use of DME       PT Treatment Interventions DME instruction;Therapeutic activities;Gait training;Therapeutic exercise;Patient/family education;Stair training;Balance training;Functional mobility training;Neuromuscular re-education;Wheelchair mobility training    PT Goals (Current goals can be found in the Care Plan section)  Acute Rehab PT Goals Patient Stated Goal: to go home PT Goal Formulation: With patient Time For Goal Achievement: 12/07/22 Potential to Achieve Goals: Good    Frequency Min 4X/week     Co-evaluation PT/OT/SLP Co-Evaluation/Treatment: Yes Reason for Co-Treatment: To address functional/ADL transfers PT goals addressed during session: Mobility/safety with mobility;Balance OT goals addressed during session: Proper use of Adaptive equipment and DME;Strengthening/ROM       AM-PAC PT "6 Clicks" Mobility  Outcome Measure Help needed turning from your back to your side while in a  flat bed without using bedrails?: None Help needed moving from lying on your back to sitting on the side of a flat bed without using bedrails?: None Help needed moving to and from a bed to a chair (including a wheelchair)?: A Little Help needed standing up from a chair using your arms (e.g., wheelchair or bedside chair)?: Total Help needed to walk in hospital room?: Total Help needed climbing 3-5 steps with a railing? : Total 6 Click Score: 14    End of Session   Activity Tolerance: Patient tolerated treatment well Patient left: in chair;with chair alarm set;with call bell/phone within reach Nurse Communication: Mobility status PT Visit Diagnosis: Other abnormalities of gait and mobility (R26.89);Muscle weakness (generalized) (M62.81);Pain Pain - Right/Left:  (bilateral) Pain - part of body: Ankle and joints of foot    Time: 4098-1191 PT Time Calculation (min) (ACUTE ONLY): 28 min   Charges:   PT Evaluation $PT Eval Low Complexity: 1 Low PT Treatments $Therapeutic Activity: 8-22 mins       Olga Coaster PT, DPT 1:25 PM,11/23/22

## 2022-11-24 ENCOUNTER — Other Ambulatory Visit: Payer: Self-pay

## 2022-11-24 DIAGNOSIS — M86172 Other acute osteomyelitis, left ankle and foot: Secondary | ICD-10-CM | POA: Diagnosis not present

## 2022-11-24 DIAGNOSIS — E11621 Type 2 diabetes mellitus with foot ulcer: Secondary | ICD-10-CM | POA: Diagnosis not present

## 2022-11-24 DIAGNOSIS — A419 Sepsis, unspecified organism: Secondary | ICD-10-CM | POA: Diagnosis not present

## 2022-11-24 DIAGNOSIS — I330 Acute and subacute infective endocarditis: Secondary | ICD-10-CM | POA: Insufficient documentation

## 2022-11-24 DIAGNOSIS — L03115 Cellulitis of right lower limb: Secondary | ICD-10-CM | POA: Diagnosis not present

## 2022-11-24 LAB — SURGICAL PATHOLOGY

## 2022-11-24 LAB — CULTURE, BLOOD (ROUTINE X 2): Culture: NO GROWTH

## 2022-11-24 LAB — GLUCOSE, CAPILLARY
Glucose-Capillary: 146 mg/dL — ABNORMAL HIGH (ref 70–99)
Glucose-Capillary: 196 mg/dL — ABNORMAL HIGH (ref 70–99)
Glucose-Capillary: 277 mg/dL — ABNORMAL HIGH (ref 70–99)
Glucose-Capillary: 182 mg/dL — ABNORMAL HIGH (ref 70–99)

## 2022-11-24 LAB — AEROBIC/ANAEROBIC CULTURE W GRAM STAIN (SURGICAL/DEEP WOUND)

## 2022-11-24 IMAGING — DX DG FOOT 2V*L*
2 series · 2 of 2 positions shown · non-contrast
Comparison: 10/21/2021

CLINICAL DATA: Left foot partial amputation

EXAM:
LEFT FOOT - 2 VIEW

[foot ap]
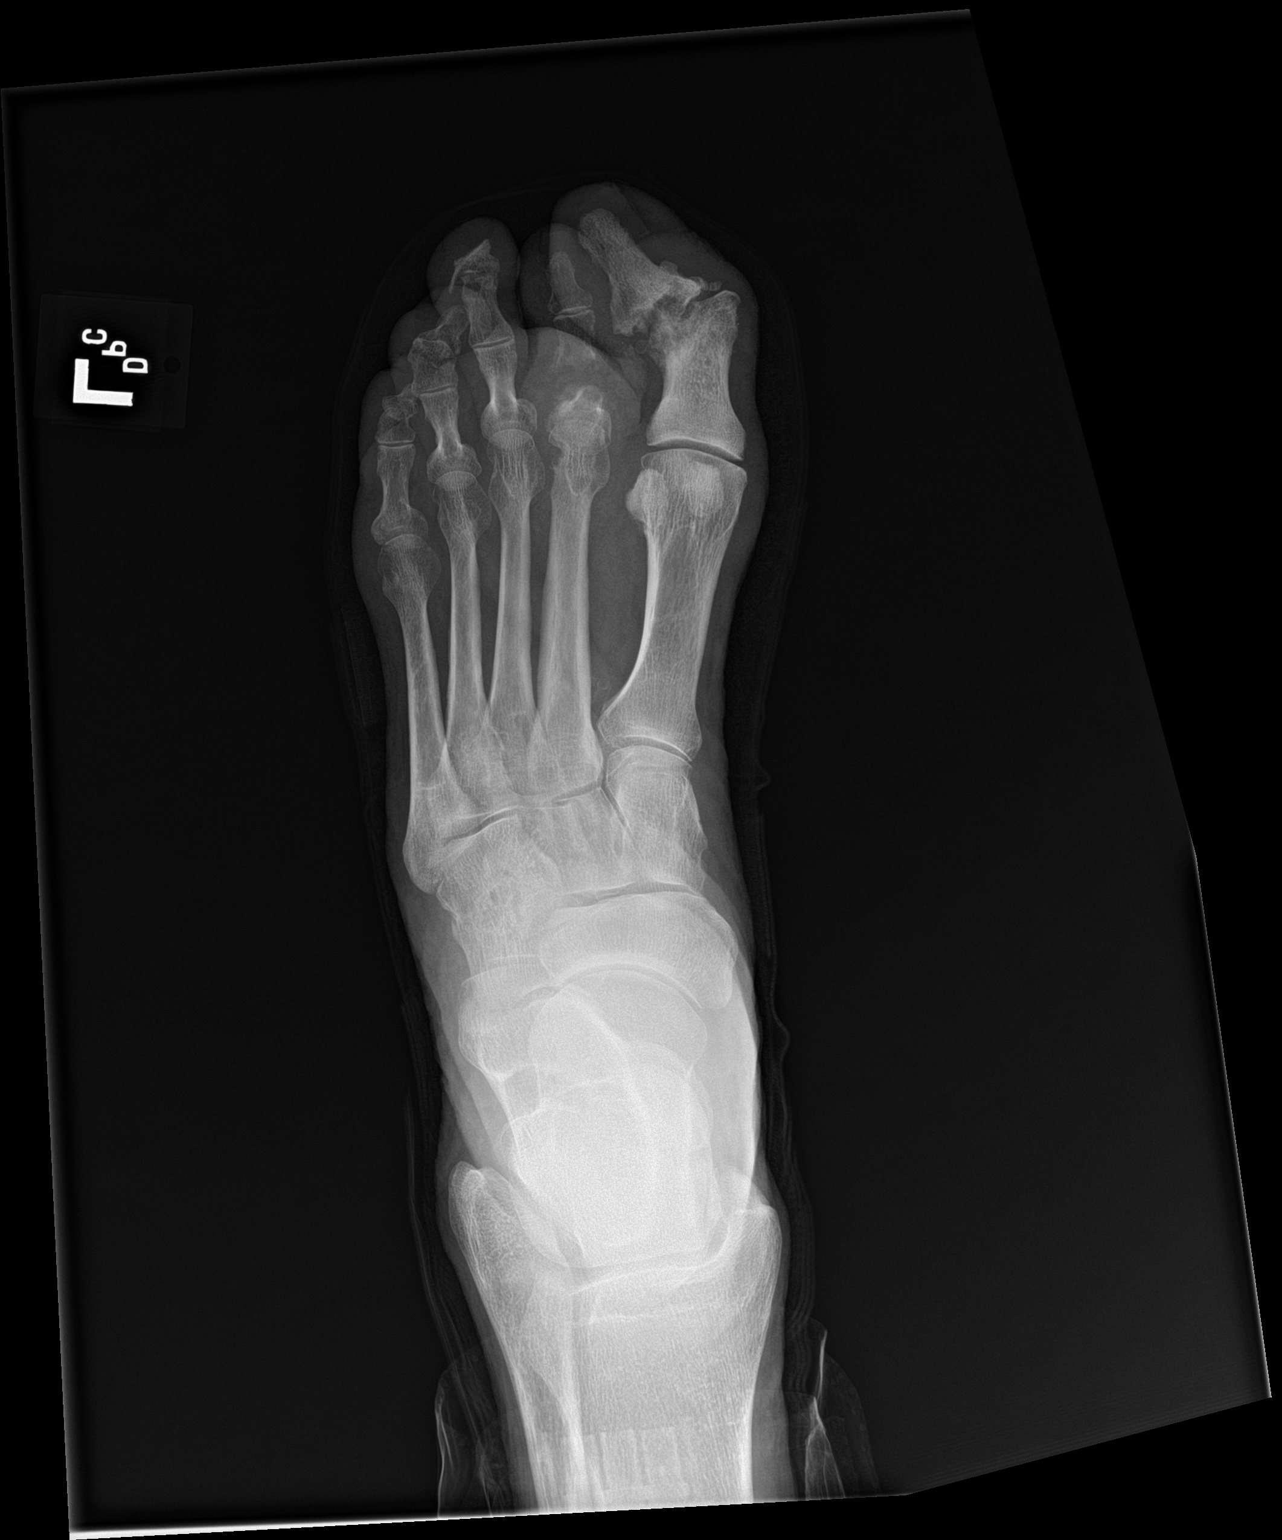

[foot lat]
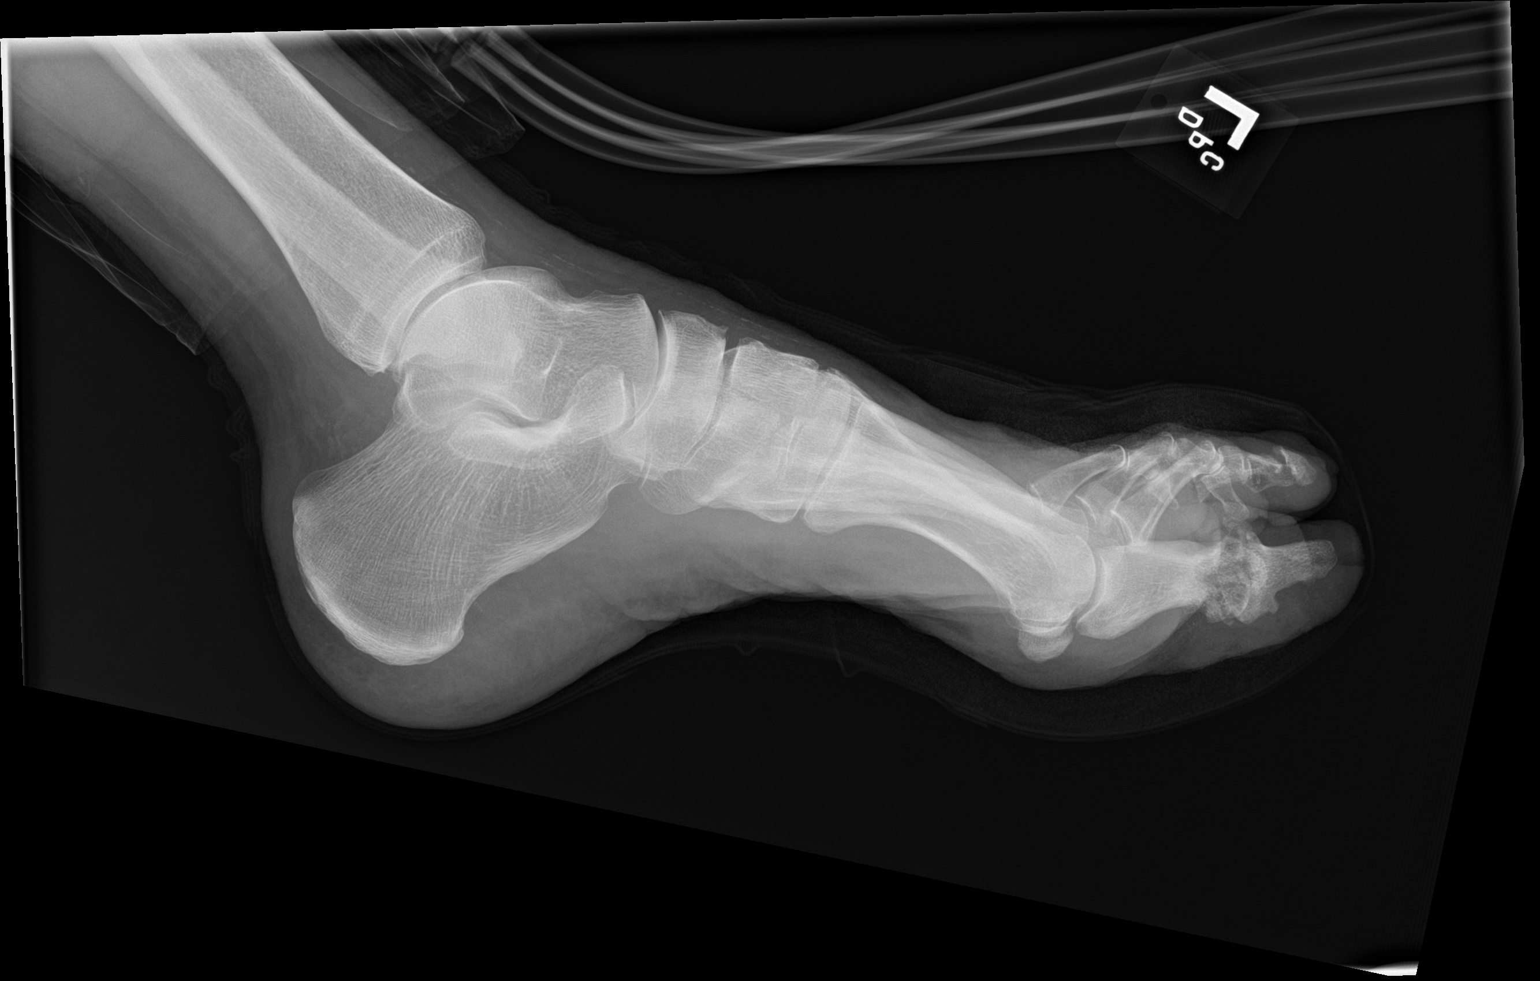

[2 of 2 positions shown; findings below may reference images not displayed]

FINDINGS: Since the prior examination, there has been partial resection of the
proximal aspect of the second middle phalanx. Remote resection of
the distal aspect of the second proximal phalanx again noted.
Advanced erosive changes noted involving the interphalangeal joint
of the great toe with marked lateral angulation. Posttraumatic or
postsurgical changes involving the distal phalanx of the third toe
again noted. Stable erosive changes involving the second metatarsal
head. No acute fracture or dislocation. Vascular calcifications
noted.
IMPRESSION: Interval partial resection of the proximal aspect of the second
middle phalanx.

Otherwise stable appearance of the left foot when compared to prior
examination of 10/21/2021.

## 2022-11-24 MED ORDER — GLUCERNA SHAKE PO LIQD
237.0000 mL | Freq: Two times a day (BID) | ORAL | Status: DC
  Administered 2022-11-27 (×2): 237 mL via ORAL

## 2022-11-24 NOTE — TOC Initial Note (Signed)
Transition of Care [facility]) - Initial/Assessment Note    Patient Details  Name: Alexander Gill MRN: [id number] Date of Birth: 1956/03/26  [facility]) CM/SW Contact:    Chapman Fitch, RN Phone Number: 11/24/2022, 3:40 PM  Clinical Narrative:                  Admitted for: s/p TMA Admitted from: home with wife PCP: Joycelyn Man Current home health/prior home health/DME: cane and knee scooter  Anticipated patient will need IV antibiotics at discharge Therapy recommending SNF Patient in agreement to bed search PASRR obtained Fl2 sent for signature Bed Search initiated   Expected Discharge Plan: Skilled Nursing Facility Barriers to Discharge: Continued Medical Work up   Patient Goals and CMS Choice       [place] ownership interest in [facility].provided to:: Patient    Expected Discharge Plan and Services       Living arrangements for the past 2 months: Single Family Home                                      Prior Living Arrangements/Services Living arrangements for the past 2 months: Single Family Home Lives with:: Spouse              Current home services: DME    Activities of Daily Living Home Assistive Devices/Equipment: None ADL Screening (condition at time of admission) Patient's cognitive ability adequate to safely complete daily activities?: Yes Is the patient deaf or have difficulty hearing?: No Does the patient have difficulty seeing, even when wearing glasses/contacts?: No Does the patient have difficulty concentrating, remembering, or making decisions?: No Patient able to express need for assistance with ADLs?: Yes Does the patient have difficulty dressing or bathing?: No Independently performs ADLs?: Yes (appropriate for developmental age) Does the patient have difficulty walking or climbing stairs?: Yes Weakness of Legs: Both Weakness of Arms/Hands: None  Permission Sought/Granted                   Emotional Assessment              Admission diagnosis:  Wound infection [T14.8XXA, L08.9] Cellulitis of right lower extremity [L03.115] Diabetic foot infection (HCC) [J19.147, L08.9] Sepsis, due to unspecified organism, unspecified whether acute organ dysfunction present [facility]) [A41.9] Patient Active Problem List   Diagnosis Date Noted   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, need TEE to confirm 11/24/2022   Cellulitis of right lower extremity 11/20/2022   Sepsis (HCC) 11/20/2022   Type 2 diabetes mellitus with diabetic foot ulcer (HCC) 11/20/2022   Hypokalemia 11/20/2022   Osteomyelitis of left foot (HCC)    Cellulitis 10/21/2021   Pyogenic inflammation of bone (HCC) 07/04/2021   Diabetes mellitus without complication (HCC)    Hypertension    HLD (hyperlipidemia)    Hypothyroidism    Toe osteomyelitis (HCC) 01/16/2021   PCP:  [facility] Pharmacy:   [id number] - [place], [place] - [address] [place] [address] Phone: [phone] Fax: [phone]  [facility] [address]), [place] - [address] [place]) [place] [address] Phone: [phone] Fax: [phone]     Social Determinants of Health (SDOH) Social History: SDOH Screenings   Food Insecurity: No Food Insecurity (11/21/2022)  Housing: Low Risk  (11/21/2022)  Transportation Needs: No Transportation Needs (11/21/2022)  Utilities: Not At Risk (11/21/2022)  Depression (PHQ2-9): Low Risk  (11/13/2021)  Tobacco Use: Low Risk  (11/23/2022)   SDOH Interventions:     Readmission Risk Interventions     No data to display

## 2022-11-24 NOTE — Progress Notes (Signed)
   [facility] has been requested to perform a transesophageal echocardiogram on Alexander Gill for bacetermia.  After careful review of history and examination, the risks and benefits of transesophageal echocardiogram have been explained including risks of esophageal damage, perforation (1:10,000 risk), bleeding, pharyngeal hematoma as well as other potential complications associated with conscious sedation including aspiration, arrhythmia, respiratory failure and death. Alternatives to treatment were discussed, questions were answered. Patient is willing to proceed.   Kyaira Trantham David Stall, PA-C  11/24/2022 4:42 PM

## 2022-11-24 NOTE — NC FL2 (Signed)
[place] MEDICAID FL2 LEVEL OF CARE FORM     IDENTIFICATION  Patient Name: Alexander Gill Birthdate: 29-Aug-1955 Sex: male Admission Date (Current Location): 11/20/2022  [facility] and [place] Number:  Chiropodist and Address:         Provider Number: [id number]  Attending Physician Name and Address:  Sunnie Nielsen, DO  Relative Name and Phone Number:       Current Level of Care: Hospital Recommended Level of Care: Skilled Nursing Facility Prior Approval Number:    Date Approved/Denied:   PASRR Number:    Discharge Plan: SNF    Current Diagnoses: Patient Active Problem List   Diagnosis Date Noted   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, need TEE to confirm 11/24/2022   Cellulitis of right lower extremity 11/20/2022   Sepsis (HCC) 11/20/2022   Type 2 diabetes mellitus with diabetic foot ulcer (HCC) 11/20/2022   Hypokalemia 11/20/2022   Osteomyelitis of left foot (HCC)    Cellulitis 10/21/2021   Pyogenic inflammation of bone (HCC) 07/04/2021   Diabetes mellitus without complication (HCC)    Hypertension    HLD (hyperlipidemia)    Hypothyroidism    Toe osteomyelitis (HCC) 01/16/2021    Orientation RESPIRATION BLADDER Height & Weight     Self, Time, Situation, Place  Normal Continent Weight: 85.5 kg Height:  6\' 1"  (185.4 cm)  BEHAVIORAL SYMPTOMS/MOOD NEUROLOGICAL BOWEL NUTRITION STATUS      Continent Diet (carb modified)  AMBULATORY STATUS COMMUNICATION OF NEEDS Skin   Extensive Assist Verbally Surgical wounds, Other (Comment) (IV antibiotics)                       Personal Care Assistance Level of Assistance              Functional Limitations Info             SPECIAL CARE FACTORS FREQUENCY  PT (By licensed PT), OT (By licensed OT)                    Contractures Contractures Info: Not present    Additional Factors Info  Code Status, Allergies Code Status Info: Full Allergies Info: NKDA           Current Medications  (11/24/2022):  This is the current hospital active medication list Current Facility-Administered Medications  Medication Dose Route Frequency Provider Last Rate Last Admin   acetaminophen (TYLENOL) tablet 650 mg  650 mg Oral Q6H PRN Linus Galas, DPM       ascorbic acid (VITAMIN C) tablet 250 mg  250 mg Oral BID Sunnie Nielsen, DO   250 mg at 11/24/22 9562   aspirin EC tablet 81 mg  81 mg Oral Daily Linus Galas, DPM   81 mg at 11/24/22 0816   ceFAZolin (ANCEF) IVPB 2g/100 mL premix  2 g Intravenous Q8H Linus Galas, DPM 200 mL/hr at 11/24/22 1452 2 g at 11/24/22 1452   cholecalciferol (VITAMIN D3) 25 MCG (1000 UNIT) tablet 1,000 Units  1,000 Units Oral Daily Linus Galas, DPM   1,000 Units at 11/24/22 0816   feeding supplement (GLUCERNA SHAKE) (GLUCERNA SHAKE) liquid 237 mL  237 mL Oral BID BM Sunnie Nielsen, DO       heparin injection 5,000 Units  5,000 Units Subcutaneous Q8H Linus Galas, DPM   5,000 Units at 11/24/22 1447   hydrALAZINE (APRESOLINE) injection 5 mg  5 mg Intravenous Q2H PRN Linus Galas, DPM       insulin  aspart (novoLOG) injection 0-5 Units  0-5 Units Subcutaneous QHS Linus Galas, DPM   2 Units at 11/23/22 2033   insulin aspart (novoLOG) injection 0-9 Units  0-9 Units Subcutaneous TID WC Linus Galas, DPM   2 Units at 11/24/22 1206   insulin glargine-yfgn (SEMGLEE) injection 24 Units  24 Units Subcutaneous QHS Linus Galas, DPM   24 Units at 11/23/22 2035   levothyroxine (SYNTHROID) tablet 25 mcg  25 mcg Oral Daily Linus Galas, DPM   25 mcg at 11/24/22 0631   morphine (PF) 2 MG/ML injection 2 mg  2 mg Intravenous Q2H PRN Linus Galas, DPM       multivitamin with minerals tablet 1 tablet  1 tablet Oral Daily Sunnie Nielsen, DO   1 tablet at 11/24/22 0816   ondansetron (ZOFRAN) injection 4 mg  4 mg Intravenous Q8H PRN Linus Galas, DPM       oxyCODONE-acetaminophen (PERCOCET/ROXICET) 5-325 MG per tablet 1-2 tablet  1-2 tablet Oral Q4H PRN Linus Galas, DPM   2 tablet at 11/24/22  0546   rosuvastatin (CRESTOR) tablet 20 mg  20 mg Oral Daily Linus Galas, DPM   20 mg at 11/24/22 9629     Discharge Medications: Please see discharge summary for a list of discharge medications.  Relevant Imaging Results:  Relevant Lab Results:   Additional Information ss [id number]  Chapman Fitch, RN

## 2022-11-24 NOTE — Progress Notes (Addendum)
Initial Nutrition Assessment  DOCUMENTATION CODES:   Not applicable  INTERVENTION:  - Modify to Glucerna Shake po BID, each supplement provides 220 kcal and 10 grams of protein  NUTRITION DIAGNOSIS:   Increased nutrient needs related to post-op healing as evidenced by estimated needs.  GOAL:   Patient will meet greater than or equal to 90% of their needs  MONITOR:   PO intake, Supplement acceptance  REASON FOR ASSESSMENT:   Malnutrition Screening Tool    ASSESSMENT:   67 y.o. male admits related to left foot ulcers, and right lower leg pain. PMH includes: DM and HTN. Pt is currently receiving medical management related to osteomyelitis of left foot.  Meds reviewed:  Vit C, Vit D3, sliding scale insulin, semglee (24 units), MVI. Labs reviewed: K low. FS BG 146-271 mg/dL.    The pt reports that he normally has a good appetite and was eating well PTA. He states that due to medications he was on were causing him to have a poor appetite. He states that since coming off of them his appetite has improved. Pt's current weight is currently noted at 38.7 kg which is likely inaccurate. Pt weighed in at about 83 kg about a year ago, which appears to be more accurate based on visual assessment. RD asked RN about obtaining a new bed weight.   NUTRITION - FOCUSED PHYSICAL EXAM:  Flowsheet Row Most Recent Value  Orbital Region No depletion  Upper Arm Region No depletion  Thoracic and Lumbar Region No depletion  Buccal Region No depletion  Temple Region Mild depletion  Clavicle Bone Region No depletion  Clavicle and Acromion Bone Region No depletion  Scapular Bone Region No depletion  Dorsal Hand No depletion  Patellar Region No depletion  Anterior Thigh Region No depletion  Posterior Calf Region No depletion  Edema (RD Assessment) Severe  [RLE]  Hair Reviewed  Eyes Reviewed  Mouth Reviewed  Skin Reviewed  Nails Reviewed       Diet Order:   Diet Order             Diet  Carb Modified Fluid consistency: Thin; Room service appropriate? Yes  Diet effective now                   EDUCATION NEEDS:   Not appropriate for education at this time  Skin:  Skin Assessment: Skin Integrity Issues: Skin Integrity Issues:: Incisions Incisions: left foot  Last BM:  11/22/22  Height:   Ht Readings from Last 1 Encounters:  11/20/22 6\' 1"  (1.854 m)    Weight:   Wt Readings from Last 1 Encounters:  11/24/22 38.7 kg    Ideal Body Weight:     BMI:  Body mass index is 11.26 kg/m.  Estimated Nutritional Needs:   Kcal:  1610-9604 kcals  Protein:  100-125 gm  Fluid:  >/= 2 L  Bethann Humble, RD, LDN, CNSC.

## 2022-11-24 NOTE — Progress Notes (Signed)
PROGRESS NOTE    Alexander Gill   ZOX:[id number] DOB: 05/23/1956  DOA: 11/20/2022 Date of Service: 11/24/22 PCP: [facility]     Brief Narrative / Hospital Course:  Alexander Gill is a 67 y.o. 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  05/03:  WBC 19.4, GFR> 60, potassium 3.4, temperature 101.5, blood pressure 120/62, heart rate 93, RR 20, oxygen saturation 99% on room air.  Patient is admitted to MedSurg for psteomyelitis, cellulitis w/ abscess L foot.  Dr. Alberteen Spindle for podiatry was consulted. L foot abscess cultured. Plan surgery tomorrow for amputation of the second ray with potentially the first ray as well.  05/04: surgery was on schedule but had to be deferred d/t power outage in the hospital. Pt stable. WBC trending down. (+)BCx gram pos cocci - ID panel (+)staph --> abx narrowed to cefazolin  05/05: Underwent Transmetatarsal amputation left foot w/ Dr Alberteen Spindle. Repeat BCx drawn. Plan ID consult tomorrow to guide further IV abx, awaiting surgical culture/pathology. Pt is NWB, will need PT and possible SNF/STR. 05/06: awaiting susceptibilities from initial cultures. Repeat cultures NG thus far. Echo ordered. Will need TEE. ID on board.  05/07: repeat BCx neg. Initial BCx pan-sensitive staph aureus. On surgical pathology, bone at proximal margin negative for osteomyelitis. Echo EF 60-65%. (+)Moderate vegetation on the mitral valve --> TEE.   Consultants:  Podiatry  Infectious Disease  Cardiology  Procedures: 05/05: Transmetatarsal amputation left foot w/ Dr Alberteen Spindle.      ASSESSMENT & PLAN:   Principal Problem:   Osteomyelitis of left foot (HCC) Active Problems:   Cellulitis of right lower extremity   Sepsis (HCC)   Type 2 diabetes mellitus with diabetic foot ulcer (HCC)    Hypertension   HLD (hyperlipidemia)   Hypothyroidism   Hypokalemia   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, need TEE to confirm     Sepsis due to left foot osteomyelitis and cellulitis of right lower extremity Staph aureus bacteremia pan-sensitive Concern for mitral valve vegetation on TTE  Sepsis parameters have resolved  S/p Transmetatarsal amputation left foot w/ Dr Alberteen Spindle. Abx: 05/03 Zosyn in ED --> 05/03 vancomycin, Flagyl, Rocephin --(pos BCx 05/04)--> cefazolin  continue on cefazolin 2gm iv q 8hr. If blood cx NGTD in the next 24hr, can place picc line --> PICC ordered  TTE ordered, (+)concern for mitral valve vegetation, will need TTE PRN Zofran for nausea tylenol and oxycodone for pain Underwent Transmetatarsal amputation left foot w/ Dr Alberteen Spindle 11/22/22.  ID following to guide further IV abx Pt is NWB, will need PT --> SNF/STR. Repeat BCx drawn 05/05   MRSA colonization mupirocin bid plus chg bathing x 5 days   Type 2 diabetes mellitus with diabetic foot ulcer (HCC):  Recent A1c 8.2, poorly controlled.   Patient is taking Xigduo XR, semaglutide and glargine insulin 36 units daily Glargine insulin 24 Units daily  SSI   Hypertension: Hold home blood pressure medications: Atenolol, Hyzaar since patient at risk of developing hypotension due to sepsis IV hydralazine as needed   HLD (hyperlipidemia) Crestor   Hypothyroidism Synthroid   Hypokalemia Replace as needed Monitor BMP       DVT prophylaxis: heparin  Pertinent IV fluids/nutrition: NPO pending surgery, IV fluids per orders  Central lines / invasive devices: none  Code Status: FULL CODE ACP documentation reviewed: 05/04  none on file   Current Admission Status: inpatient   TOC needs / Dispo plan: SNF, IV abx Barriers to discharge / significant pending items: needs TEE, pending final IV abx recs              Subjective / Brief ROS:  Patient reports doing well Denies CP/SOB.  Pain controlled.     Family Communication: none at this time     Objective Findings:  Vitals:   11/24/22 0300 11/24/22 0422 11/24/22 0828 11/24/22 1500  BP: (!) 160/79  (!) 147/83   Pulse: 67  68   Resp: 18  18   Temp: 98.3 F (36.8 C)  98.1 F (36.7 C)   TempSrc:      SpO2: 97%  95%   Weight:  38.7 kg  85.5 kg  Height:        Intake/Output Summary (Last 24 hours) at 11/24/2022 1530 Last data filed at 11/24/2022 1528 Gross per 24 hour  Intake 1640 ml  Output 1150 ml  Net 490 ml   Filed Weights   11/20/22 1339 11/24/22 0422 11/24/22 1500  Weight: 83.9 kg 38.7 kg 85.5 kg    Examination:  Physical Exam Constitutional:      General: He is not in acute distress. Cardiovascular:     Rate and Rhythm: Normal rate and regular rhythm.  Pulmonary:     Effort: Pulmonary effort is normal.     Breath sounds: Normal breath sounds.  Neurological:     General: No focal deficit present.     Mental Status: He is alert.  Psychiatric:        Mood and Affect: Mood normal.        Behavior: Behavior normal.          Scheduled Medications:   vitamin C  250 mg Oral BID   aspirin EC  81 mg Oral Daily   cholecalciferol  1,000 Units Oral Daily   feeding supplement (GLUCERNA SHAKE)  237 mL Oral BID BM   heparin  5,000 Units Subcutaneous Q8H   insulin aspart  0-5 Units Subcutaneous QHS   insulin aspart  0-9 Units Subcutaneous TID WC   insulin glargine-yfgn  24 Units Subcutaneous QHS   levothyroxine  25 mcg Oral Daily   multivitamin with minerals  1 tablet Oral Daily   rosuvastatin  20 mg Oral Daily    Continuous Infusions:   ceFAZolin (ANCEF) IV 2 g (11/24/22 1452)    PRN Medications:  acetaminophen, hydrALAZINE, morphine injection, ondansetron (ZOFRAN) IV, oxyCODONE-acetaminophen  Antimicrobials from admission:  Anti-infectives (From admission, onward)    Start     Dose/Rate Route Frequency Ordered Stop   11/21/22 1400  ceFAZolin (ANCEF) IVPB 2g/100 mL premix        2 g 200 mL/hr  over 30 Minutes Intravenous Every 8 hours 11/21/22 1006     11/20/22 2200  cefTRIAXone (ROCEPHIN) 2 g in sodium chloride 0.9 % 100 mL IVPB  Status:  Discontinued        2 g 200 mL/hr over 30 Minutes Intravenous Every 24 hours 11/20/22 1509 11/21/22 1006   11/20/22 2200  metroNIDAZOLE (FLAGYL) IVPB 500 mg  Status:  Discontinued        500 mg 100 mL/hr over 60 Minutes Intravenous Every 12 hours 11/20/22 1509 11/23/22 1029   11/20/22 1445  vancomycin (VANCOCIN) IVPB 1000 mg/200 mL premix  Status:  Discontinued        1,000 mg 200 mL/hr over 60 Minutes  Intravenous  Once 11/20/22 1432 11/20/22 1434   11/20/22 1445  piperacillin-tazobactam (ZOSYN) IVPB 3.375 g        3.375 g 100 mL/hr over 30 Minutes Intravenous  Once 11/20/22 1432 11/20/22 1548   11/20/22 1445  vancomycin (VANCOREADY) IVPB 2000 mg/400 mL        2,000 mg 200 mL/hr over 120 Minutes Intravenous  Once 11/20/22 1434 11/20/22 1728           Data Reviewed:  I have personally reviewed the following...  CBC: Recent Labs  Lab 11/20/22 1345 11/21/22 0609  WBC 19.4* 13.1*  NEUTROABS 17.9*  --   HGB 12.3* 10.5*  HCT 37.8* 32.8*  MCV 85.5 87.5  PLT 331 348   Basic Metabolic Panel: Recent Labs  Lab 11/20/22 1345 11/21/22 0609  NA 134* 138  K 3.4* 3.3*  CL 100 108  CO2 23 22  GLUCOSE 154* 131*  BUN 19 20  CREATININE 0.90 0.92  CALCIUM 8.5* 7.8*  MG 2.0  --    GFR: Estimated Creatinine Clearance: 90.5 mL/min (by C-G formula based on SCr of 0.92 mg/dL). Liver Function Tests: Recent Labs  Lab 11/20/22 1345  AST 18  ALT 14  ALKPHOS 105  BILITOT 1.2  PROT 7.7  ALBUMIN 2.6*   No results for input(s): "LIPASE", "AMYLASE" in the last 168 hours. No results for input(s): "AMMONIA" in the last 168 hours. Coagulation Profile: Recent Labs  Lab 11/20/22 1531  INR 1.4*   Cardiac Enzymes: No results for input(s): "CKTOTAL", "CKMB", "CKMBINDEX", "TROPONINI" in the last 168 hours. BNP (last 3 results) No  results for input(s): "PROBNP" in the last 8760 hours. HbA1C: No results for input(s): "HGBA1C" in the last 72 hours. CBG: Recent Labs  Lab 11/23/22 1149 11/23/22 1617 11/23/22 2025 11/24/22 0744 11/24/22 1148  GLUCAP 271* 267* 221* 146* 196*   Lipid Profile: No results for input(s): "CHOL", "HDL", "LDLCALC", "TRIG", "CHOLHDL", "LDLDIRECT" in the last 72 hours. Thyroid Function Tests: No results for input(s): "TSH", "T4TOTAL", "FREET4", "T3FREE", "THYROIDAB" in the last 72 hours. Anemia Panel: No results for input(s): "VITAMINB12", "FOLATE", "FERRITIN", "TIBC", "IRON", "RETICCTPCT" in the last 72 hours. Most Recent Urinalysis On File:  No results found for: "COLORURINE", "APPEARANCEUR", "LABSPEC", "PHURINE", "GLUCOSEU", "HGBUR", "BILIRUBINUR", "KETONESUR", "PROTEINUR", "UROBILINOGEN", "NITRITE", "LEUKOCYTESUR" Sepsis Labs: @LABRCNTIP (procalcitonin:4,lacticidven:4) Microbiology: Recent Results (from the past 240 hour(s))  Blood culture (routine x 2)     Status: Abnormal   Collection Time: 11/20/22  2:24 PM   Specimen: BLOOD RIGHT FOREARM  Result Value Ref Range Status   Specimen Description   Final    BLOOD RIGHT FOREARM Performed at [facility], [address], [place], [place] [address]    Special Requests   Final    BOTTLES DRAWN AEROBIC AND ANAEROBIC Blood Culture results may not be optimal due to an excessive volume of blood received in culture bottles Performed at [facility], [address] [place], [place] [address]    Culture  Setup Time   Final    GRAM POSITIVE COCCI IN BOTH AEROBIC AND ANAEROBIC BOTTLES CRITICAL RESULT CALLED TO, READ BACK BY AND VERIFIED WITH: NATHAN BELUE AT 0601 11/21/22.PMF Performed at [facility], [address] [place], [place] [address]    Culture (A)  Final    STAPHYLOCOCCUS AUREUS SUSCEPTIBILITIES PERFORMED ON PREVIOUS CULTURE WITHIN THE LAST 5 DAYS. Performed at [facility], [address],  [place], [place] [address]    Report Status 11/24/2022  FINAL  Final  Blood culture (routine x 2)     Status: Abnormal   Collection Time: 11/20/22  2:49 PM   Specimen: BLOOD LEFT FOREARM  Result Value Ref Range Status   Specimen Description   Final    BLOOD LEFT FOREARM Performed at [facility], [address], [place], [place] [address]    Special Requests   Final    BOTTLES DRAWN AEROBIC AND ANAEROBIC Blood Culture results may not be optimal due to an excessive volume of blood received in culture bottles Performed at [facility], [address] [place], [place] [address]    Culture  Setup Time   Final    Organism ID to follow GRAM POSITIVE COCCI AEROBIC BOTTLE ONLY CRITICAL RESULT CALLED TO, READ BACK BY AND VERIFIED WITHDawayne Cirri @ 0981 11/21/22 [facility] Performed at [facility], [address] [place], [place] [address]    Culture STAPHYLOCOCCUS AUREUS (A)  Final   Report Status 11/24/2022 FINAL  Final   Organism ID, Bacteria STAPHYLOCOCCUS AUREUS  Final      Susceptibility   Staphylococcus aureus - MIC*    CIPROFLOXACIN <=0.5 SENSITIVE Sensitive     ERYTHROMYCIN <=0.25 SENSITIVE Sensitive     GENTAMICIN <=0.5 SENSITIVE Sensitive     OXACILLIN <=0.25 SENSITIVE Sensitive     TETRACYCLINE <=1 SENSITIVE Sensitive     VANCOMYCIN 1 SENSITIVE Sensitive     TRIMETH/SULFA <=10 SENSITIVE Sensitive     CLINDAMYCIN <=0.25 SENSITIVE Sensitive     RIFAMPIN <=0.5 SENSITIVE Sensitive     Inducible Clindamycin NEGATIVE Sensitive     LINEZOLID 2 SENSITIVE Sensitive     * STAPHYLOCOCCUS AUREUS  Blood Culture ID Panel (Reflexed)     Status: Abnormal   Collection Time: 11/20/22  2:49 PM  Result Value Ref Range Status   Enterococcus faecalis NOT DETECTED NOT DETECTED Final   Enterococcus Faecium NOT DETECTED NOT DETECTED Final   Listeria monocytogenes NOT DETECTED NOT DETECTED Final   Staphylococcus species DETECTED (A) NOT DETECTED Final    Comment: CRITICAL RESULT  CALLED TO, READ BACK BY AND VERIFIED WITH: NATHAN BELUE @ 0440 11/21/22 [facility]    Staphylococcus aureus (BCID) DETECTED (A) NOT DETECTED Final    Comment: CRITICAL RESULT CALLED TO, READ BACK BY AND VERIFIED WITH: NATHAN BELUE @ 0440 11/21/22 [facility]    Staphylococcus epidermidis NOT DETECTED NOT DETECTED Final   Staphylococcus lugdunensis NOT DETECTED NOT DETECTED Final   Streptococcus species NOT DETECTED NOT DETECTED Final   Streptococcus agalactiae NOT DETECTED NOT DETECTED Final   Streptococcus pneumoniae NOT DETECTED NOT DETECTED Final   Streptococcus pyogenes NOT DETECTED NOT DETECTED Final   A.calcoaceticus-baumannii NOT DETECTED NOT DETECTED Final   Bacteroides fragilis NOT DETECTED NOT DETECTED Final   Enterobacterales NOT DETECTED NOT DETECTED Final   Enterobacter cloacae complex NOT DETECTED NOT DETECTED Final   Escherichia coli NOT DETECTED NOT DETECTED Final   Klebsiella aerogenes NOT DETECTED NOT DETECTED Final   Klebsiella oxytoca NOT DETECTED NOT DETECTED Final   Klebsiella pneumoniae NOT DETECTED NOT DETECTED Final   Proteus species NOT DETECTED NOT DETECTED Final   Salmonella species NOT DETECTED NOT DETECTED Final   Serratia marcescens NOT DETECTED NOT DETECTED Final   Haemophilus influenzae NOT DETECTED NOT DETECTED Final   Neisseria meningitidis NOT DETECTED NOT DETECTED Final   Pseudomonas aeruginosa NOT DETECTED NOT DETECTED Final   Stenotrophomonas maltophilia NOT DETECTED NOT DETECTED Final   Candida albicans NOT DETECTED NOT  DETECTED Final   Candida auris NOT DETECTED NOT DETECTED Final   Candida glabrata NOT DETECTED NOT DETECTED Final   Candida krusei NOT DETECTED NOT DETECTED Final   Candida parapsilosis NOT DETECTED NOT DETECTED Final   Candida tropicalis NOT DETECTED NOT DETECTED Final   Cryptococcus neoformans/gattii NOT DETECTED NOT DETECTED Final   Meth resistant mecA/C and MREJ NOT DETECTED NOT DETECTED Final    Comment: Performed at [facility],  [address] [place], [place] [address]  Aerobic/Anaerobic Culture w Gram Stain (surgical/deep wound)     Status: None (Preliminary result)   Collection Time: 11/20/22  5:51 PM   Specimen: Wound  Result Value Ref Range Status   Specimen Description   Final    WOUND Performed at [facility], [address], [place], [place] [address]    Special Requests   Final    LEFT FOOT Performed at [facility], [address] [place], [place] [address]    Gram Stain   Final    RARE WBC PRESENT, PREDOMINANTLY PMN NO ORGANISMS SEEN Performed at [facility], [address], [place], [place] [address]    Culture   Final    ABUNDANT STAPHYLOCOCCUS AUREUS CONFIRMATION OF SUSCEPTIBILITIES IN PROGRESS NO ANAEROBES ISOLATED; CULTURE IN PROGRESS FOR 5 DAYS    Report Status PENDING  Incomplete  Aerobic/Anaerobic Culture w Gram Stain (surgical/deep wound)     Status: None (Preliminary result)   Collection Time: 11/22/22  9:11 AM   Specimen: PATH Bone biopsy; Tissue  Result Value Ref Range Status   Specimen Description   Final    BONE Performed at [facility], [address], [place], [place] [address]    Special Requests   Final    NONE Performed at [facility], [address] [place], [place] [address]    Gram Stain   Final    FEW WBC PRESENT,BOTH PMN AND MONONUCLEAR RARE GRAM POSITIVE COCCI Performed at [facility], [address], [place], [place] [address]    Culture   Final    MODERATE STAPHYLOCOCCUS AUREUS SUSCEPTIBILITIES TO FOLLOW NO ANAEROBES ISOLATED; CULTURE IN PROGRESS FOR 5 DAYS    Report Status PENDING  Incomplete  Culture, blood (Routine X 2) w Reflex to ID Panel     Status: None (Preliminary result)   Collection Time: 11/22/22 11:59 AM   Specimen: BLOOD RIGHT HAND  Result Value Ref Range Status   Specimen Description BLOOD RIGHT HAND  Final   Special Requests   Final    BOTTLES DRAWN AEROBIC AND ANAEROBIC  Blood Culture adequate volume   Culture   Final    NO GROWTH 2 DAYS Performed at [facility], [address] [place], [place] [address]    Report Status PENDING  Incomplete  Culture, blood (Routine X 2) w Reflex to ID Panel     Status: None (Preliminary result)   Collection Time: 11/22/22 12:00 PM   Specimen: BLOOD  Result Value Ref Range Status   Specimen Description BLOOD RIGHT ANTECUBITAL  Final   Special Requests   Final    BOTTLES DRAWN AEROBIC AND ANAEROBIC Blood Culture adequate volume   Culture   Final    NO GROWTH 2 DAYS Performed at [facility], [address] [place], [place] [address]    Report Status PENDING  Incomplete      Radiology Studies last 3 days: ECHOCARDIOGRAM COMPLETE  Result Date: 11/23/2022  ECHOCARDIOGRAM REPORT   Patient Name:   TAVARUS MADRIAGA Date of Exam: 11/23/2022 Medical Rec #:  [id number]    Height:       73.0 in Accession #:    [id number]   Weight:       185.0 lb Date of Birth:  05-08-1956    BSA:          2.082 m Patient Age:    66 years     BP:           136/78 mmHg Patient Gender: M            HR:           70 bpm. Exam Location:  [facility] Procedure: 2D Echo, Cardiac Doppler and Color Doppler Indications:     Bacteremia  History:         Patient has no prior history of Echocardiogram examinations.                  Signs/Symptoms:Bacteremia; Risk Factors:Hypertension, Diabetes                  and Dyslipidemia.  Sonographer:     Mikki Harbor Referring Phys:  [id number] Sunnie Nielsen Diagnosing Phys: Lorine Bears MD IMPRESSIONS  1. Left ventricular ejection fraction, by estimation, is 60 to 65%. The left ventricle has normal function. The left ventricle has no regional wall motion abnormalities. Left ventricular diastolic parameters are indeterminate.  2. Right ventricular systolic function is normal. The right ventricular size is normal. There is normal pulmonary artery systolic pressure.  3. Left atrial size was moderately dilated.   4. Moderate vegetation on the mitral valve.  5. The mitral valve is normal in structure. Mild to moderate mitral valve regurgitation. No evidence of mitral stenosis.  6. The aortic valve is normal in structure. Aortic valve regurgitation is not visualized. No aortic stenosis is present.  7. The inferior vena cava is normal in size with greater than 50% respiratory variability, suggesting right atrial pressure of 3 mmHg. Conclusion(s)/Recommendation(s): Findings concerning for mitral valve vegetation, would recommend a Transesophageal Echocardiogram for clarification. FINDINGS  Left Ventricle: Left ventricular ejection fraction, by estimation, is 60 to 65%. The left ventricle has normal function. The left ventricle has no regional wall motion abnormalities. The left ventricular internal cavity size was normal in size. There is  no left ventricular hypertrophy. Left ventricular diastolic parameters are indeterminate. Right Ventricle: The right ventricular size is normal. No increase in right ventricular wall thickness. Right ventricular systolic function is normal. There is normal pulmonary artery systolic pressure. The tricuspid regurgitant velocity is 2.79 m/s, and  with an assumed right atrial pressure of 3 mmHg, the estimated right ventricular systolic pressure is 34.1 mmHg. Left Atrium: Left atrial size was moderately dilated. Right Atrium: Right atrial size was normal in size. Pericardium: There is no evidence of pericardial effusion. Mitral Valve: The mitral valve is normal in structure. Mild mitral annular calcification. A moderate vegetation is seen on the posterior mitral leaflet. Mild to moderate mitral valve regurgitation. No evidence of mitral valve stenosis. MV peak gradient, 5.8 mmHg. The mean mitral valve gradient is 2.0 mmHg. Tricuspid Valve: The tricuspid valve is normal in structure. Tricuspid valve regurgitation is trivial. No evidence of tricuspid stenosis. Aortic Valve: The aortic valve is normal  in structure. Aortic valve regurgitation is not visualized. No aortic stenosis is present. Aortic valve mean gradient measures 5.0 mmHg. Aortic valve peak gradient measures 10.6 mmHg. Aortic valve area,  by VTI measures 2.78 cm. Pulmonic Valve: The pulmonic valve was normal in structure. Pulmonic valve regurgitation is not visualized. No evidence of pulmonic stenosis. Aorta: The aortic root is normal in size and structure. Venous: The inferior vena cava is normal in size with greater than 50% respiratory variability, suggesting right atrial pressure of 3 mmHg. IAS/Shunts: No atrial level shunt detected by color flow Doppler.  LEFT VENTRICLE PLAX 2D LVIDd:         5.00 cm   Diastology LVIDs:         3.30 cm   LV e' medial:    47.13 cm/s LV PW:         1.00 cm   LV E/e' medial:  2.8 LV IVS:        0.90 cm   LV e' lateral:   15.40 cm/s LVOT diam:     2.00 cm   LV E/e' lateral: 8.6 LV SV:         99 LV SV Index:   48 LVOT Area:     3.14 cm  RIGHT VENTRICLE RV Basal diam:  3.35 cm RV Mid diam:    3.60 cm RV S prime:     17.70 cm/s TAPSE (M-mode): 2.4 cm LEFT ATRIUM             Index        RIGHT ATRIUM           Index LA diam:        4.90 cm 2.35 cm/m   RA Area:     17.80 cm LA Vol (A2C):   88.2 ml 42.37 ml/m  RA Volume:   49.80 ml  23.92 ml/m LA Vol (A4C):   77.5 ml 37.23 ml/m LA Biplane Vol: 83.2 ml 39.97 ml/m  AORTIC VALVE                    PULMONIC VALVE AV Area (Vmax):    2.60 cm     PV Vmax:       1.16 m/s AV Area (Vmean):   2.56 cm     PV Peak grad:  5.4 mmHg AV Area (VTI):     2.78 cm AV Vmax:           163.00 cm/s AV Vmean:          96.100 cm/s AV VTI:            0.357 m AV Peak Grad:      10.6 mmHg AV Mean Grad:      5.0 mmHg LVOT Vmax:         135.00 cm/s LVOT Vmean:        78.200 cm/s LVOT VTI:          0.316 m LVOT/AV VTI ratio: 0.89  AORTA Ao Root diam: 3.20 cm MITRAL VALVE                TRICUSPID VALVE MV Area (PHT): 3.79 cm     TR Peak grad:   31.1 mmHg MV Area VTI:   2.51 cm     TR Vmax:         279.00 cm/s MV Peak grad:  5.8 mmHg MV Mean grad:  2.0 mmHg     SHUNTS MV Vmax:       1.20 m/s     Systemic VTI:  0.32 m MV Vmean:      67.3 cm/s    Systemic Diam: 2.00 cm MV  Decel Time: 200 msec MV E velocity: 133.00 cm/s MV A velocity: 97.70 cm/s MV E/A ratio:  1.36 Lorine Bears MD Electronically signed by Lorine Bears MD Signature Date/Time: 11/23/2022/4:46:04 PM    Final    MR FOOT LEFT WO CONTRAST  Result Date: 11/21/2022 CLINICAL DATA:  Foot swelling, diabetic, osteomyelitis suspected. EXAM: MRI OF THE LEFT FOOT WITHOUT CONTRAST TECHNIQUE: Multiplanar, multisequence MR imaging of the left forefoot was performed. No intravenous contrast was administered. COMPARISON:  Radiographs dated Nov 20, 2022 FINDINGS: Bones/Joint/Cartilage Osseous erosion at the first metatarsophalangeal joint with bone marrow edema of the distal half of the first metatarsal and residual proximal phalanx. There is also edema and erosion of the distal phalanx of the first digit. There is bone marrow edema and osseous erosion at the second metatarsal head and proximal phalanx of the second digit suggesting pathological fracture with joint effusion. There is also marrow edema of the proximal phalanx of the third digit which in the presence of adjacent infectious/inflammatory process is consistent with osteomyelitis. Ligaments Evaluation of ligaments at the first and second metatarsophalangeal joints is limited secondary to ongoing chronic infectious/inflammatory process. Muscles and Tendons Flexor and extensor tendons appear intact. Increased signal of the plantar muscles suggesting myopathy/myositis. Soft tissues There is a fluid collection about the second metatarsophalangeal joint measuring at least 2.3 x 1.3 x 3.1 cm with an open wound about dorsal aspect of the foot and marked surrounding inflammatory changes consistent with an abscess. There is also deep skin wound and edema and inflammatory changes about the first  metatarsophalangeal joint suggesting chronic infectious/inflammatory process/abscess IMPRESSION: 1. Osteomyelitis of the distal half of the first metatarsal and residual proximal phalanx of the first digit, likely secondary to ongoing osteomyelitis. 2. Abnormal marrow signal of the second metatarsal head and proximal phalanx of the second digit with fracture of the second metatarsal head, likely sequela of osteomyelitis with septic arthritis. 3. Osteomyelitis of the proximal phalanx of the third digit. 4. 2.3 x 1.3 x 3.1 cm fluid collection about the second metatarsophalangeal joint with open wound about the dorsal aspect of the foot marked surrounding inflammatory changes consistent with abscess. 5. Deep skin wound and edema and inflammatory changes about the first metatarsophalangeal joint suggesting chronic infectious/inflammatory process/abscess. Electronically Signed   By: Larose Hires D.O.   On: 11/21/2022 09:39   US Venous Img Lower Unilateral Right  Result Date: 11/20/2022 CLINICAL DATA:  Swelling EXAM: RIGHT LOWER EXTREMITY VENOUS DOPPLER ULTRASOUND TECHNIQUE: Gray-scale sonography with compression, as well as color and duplex ultrasound, were performed to evaluate the deep venous system(s) from the level of the common femoral vein through the popliteal and proximal calf veins. COMPARISON:  None Available. FINDINGS: VENOUS Normal compressibility of the common femoral, superficial femoral, and popliteal veins, as well as the visualized calf veins. Visualized portions of profunda femoral vein and great saphenous vein unremarkable. No filling defects to suggest DVT on grayscale or color Doppler imaging. Doppler waveforms show normal direction of venous flow, normal respiratory plasticity and response to augmentation. Limited views of the contralateral common femoral vein are unremarkable. OTHER Enlarged right inguinal lymph node is present measuring 2.1 x 1.1 x 1.7 cm. Limitations: none IMPRESSION: 1. No  evidence of right lower extremity DVT. 2. Enlarged right inguinal lymph node. Electronically Signed   By: Darliss Cheney M.D.   On: 11/20/2022 16:03             LOS: 4 days       Dorene Grebe  Alexander Prevette, DO [facility] 11/24/2022, 3:30 PM    Dictation software may have been used to generate the above note. Typos may occur and escape review in typed/dictated notes. Please contact Dr Lyn Hollingshead directly for clarity if needed.  Staff may message me via secure chat in Epic  but this may not receive an immediate response,  please page me for urgent matters!  If 7PM-7AM, please contact night coverage [email or web address]

## 2022-11-24 NOTE — Progress Notes (Signed)
Mobility Specialist - Progress Note   11/24/22 1606  Mobility  Activity Transferred from bed to chair;Transferred from chair to bed  Level of Assistance Standby assist, set-up cues, supervision of patient - no hands on  Activity Response Tolerated well  Mobility Referral No  $Mobility charge 1 Mobility  Mobility Specialist Start Time (ACUTE ONLY) 1542  Mobility Specialist Stop Time (ACUTE ONLY) 1550  Mobility Specialist Time Calculation (min) (ACUTE ONLY) 8 min     Pt able to scoot transfer from bed-chair-bed without physical assist. Completed bed mobility independently. Chair leg rest up for easy transfer at pt request. Pt left in bed with alarm set, needs in reach.    Filiberto Pinks Mobility Specialist 11/24/22, 4:09 PM

## 2022-11-25 ENCOUNTER — Encounter: Payer: Self-pay | Admitting: Internal Medicine

## 2022-11-25 ENCOUNTER — Encounter
Admission: EM | Disposition: A | Discharge status: Skilled Nursing Facility | Payer: BC Managed Care – PPO | Source: Home / Self Care | Attending: Osteopathic Medicine

## 2022-11-25 ENCOUNTER — Encounter
Admission: EM | Disposition: A | Discharge status: Skilled Nursing Facility | Payer: Self-pay | Source: Home / Self Care | Attending: Osteopathic Medicine

## 2022-11-25 DIAGNOSIS — M86072 Acute hematogenous osteomyelitis, left ankle and foot: Secondary | ICD-10-CM | POA: Diagnosis not present

## 2022-11-25 LAB — GLUCOSE, CAPILLARY
Glucose-Capillary: 128 mg/dL — ABNORMAL HIGH (ref 70–99)
Glucose-Capillary: 143 mg/dL — ABNORMAL HIGH (ref 70–99)
Glucose-Capillary: 284 mg/dL — ABNORMAL HIGH (ref 70–99)
Glucose-Capillary: 237 mg/dL — ABNORMAL HIGH (ref 70–99)

## 2022-11-25 LAB — CBC
HCT: 34.5 % — ABNORMAL LOW (ref 39.0–52.0)
Hemoglobin: 11 g/dL — ABNORMAL LOW (ref 13.0–17.0)
MCH: 27.6 pg (ref 26.0–34.0)
MCHC: 31.9 g/dL (ref 30.0–36.0)
MCV: 86.7 fL (ref 80.0–100.0)
Platelets: 408 10*3/uL — ABNORMAL HIGH (ref 150–400)
RBC: 3.98 MIL/uL — ABNORMAL LOW (ref 4.22–5.81)
RDW: 14.9 % (ref 11.5–15.5)
WBC: 12.8 10*3/uL — ABNORMAL HIGH (ref 4.0–10.5)
nRBC: 0 % (ref 0.0–0.2)

## 2022-11-25 LAB — BASIC METABOLIC PANEL
Anion gap: 6 (ref 5–15)
BUN: 12 mg/dL (ref 8–23)
CO2: 26 mmol/L (ref 22–32)
Calcium: 7.8 mg/dL — ABNORMAL LOW (ref 8.9–10.3)
Chloride: 107 mmol/L (ref 98–111)
Creatinine, Ser: 0.74 mg/dL (ref 0.61–1.24)
GFR, Estimated: >60 mL/min (ref >60)
Glucose, Bld: 146 mg/dL — ABNORMAL HIGH (ref 70–99)
Potassium: 3.5 mmol/L (ref 3.5–5.1)
Sodium: 139 mmol/L (ref 135–145)

## 2022-11-25 LAB — CULTURE, BLOOD (ROUTINE X 2)
Culture: NO GROWTH
Special Requests: ADEQUATE

## 2022-11-25 SURGERY — ECHOCARDIOGRAM, TRANSESOPHAGEAL
Anesthesia: Moderate Sedation

## 2022-11-25 MED ORDER — LIDOCAINE VISCOUS HCL 2 % MT SOLN
OROMUCOSAL | Status: AC
  Filled 2022-11-25: qty 15

## 2022-11-25 MED ORDER — SODIUM CHLORIDE 0.9 % IV SOLN: INTRAVENOUS | Status: DC

## 2022-11-25 MED ORDER — BUTAMBEN-TETRACAINE-BENZOCAINE 2-2-14 % EX AERO
INHALATION_SPRAY | CUTANEOUS | Status: AC
  Filled 2022-11-25: qty 5

## 2022-11-25 NOTE — Progress Notes (Signed)
Occupational Therapy Treatment Patient Details Name: Taedyn Montpetit MRN: [id number] DOB: [birth year] Today's Date: 11/25/2022   History of present illness Pt admitted to [facility] on 07/04/21 for concern of R foot infection s/p L transmet amputation. 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 Alberteen Spindle. Significant PMH includes: DM, cataracts, HTN, hypothyroidism.   OT comments  Upon entering the room, pt having just returned from planned procedure that was then rescheduled. Pt clearly upset over this and has been NPO but still agreeable to therapeutic intervention. Pt asking about OT POC and what types of activities would he be doing with therapy while in hospital. Pt declined use of drop arm commode chair but agreeable to transfer into recliner chair for lunch meal. Pt declined donning of CAM boot. OT holding B LEs up in order for pt to maintain precautions and he performed lateral scoot to the R side into recliner chair. Pt requesting to drink coffee and rest at this time. Call bell and all needs within reach.    Recommendations for follow up therapy are one component of a multi-disciplinary discharge planning process, led by the attending physician.  Recommendations may be updated based on patient status, additional functional criteria and insurance authorization.    Assistance Recommended at Discharge Frequent or constant Supervision/Assistance  Patient can return home with the following  A lot of help with bathing/dressing/bathroom;A lot of help with walking and/or transfers;Assistance with cooking/housework;Assist for transportation;Help with stairs or ramp for entrance   Equipment Recommendations  Other (comment) (drop arm BSC)    Recommendations for Other Services      Precautions / Restrictions Precautions Precautions: Fall Restrictions Weight Bearing Restrictions: Yes RLE Weight Bearing: Non weight bearing LLE Weight Bearing: Non weight bearing Other Position/Activity Restrictions: per  pt, MD stated he could use his R foot in cam boot for "one step"       Mobility Bed Mobility Overal bed mobility: Needs Assistance Bed Mobility: Sit to Supine     Supine to sit: Supervision          Transfers Overall transfer level: Needs assistance Equipment used: None Transfers: Bed to chair/wheelchair/BSC            Lateral/Scoot Transfers: Min assist       Balance Overall balance assessment: Needs assistance Sitting-balance support: Feet supported Sitting balance-Leahy Scale: Fair                                     ADL either performed or assessed with clinical judgement   ADL                                              Extremity/Trunk Assessment Upper Extremity Assessment Upper Extremity Assessment: Overall WFL for tasks assessed   Lower Extremity Assessment Lower Extremity Assessment: Overall WFL for tasks assessed   Cervical / Trunk Assessment Cervical / Trunk Assessment: Normal    Vision Patient Visual Report: No change from baseline     Perception     Praxis      Cognition Arousal/Alertness: Awake/alert Behavior During Therapy: WFL for tasks assessed/performed Overall Cognitive Status: Within Functional Limits for tasks assessed  Exercises      Shoulder Instructions       General Comments      Pertinent Vitals/ Pain       Pain Assessment Pain Assessment: No/denies pain  Home Living                                          Prior Functioning/Environment              Frequency  Min 3X/week        Progress Toward Goals  OT Goals(current goals can now be found in the care plan section)  Progress towards OT goals: Progressing toward goals     Plan Discharge plan remains appropriate;Frequency remains appropriate    Co-evaluation                 AM-PAC OT "6 Clicks" Daily Activity     Outcome  Measure   Help from another person eating meals?: None Help from another person taking care of personal grooming?: A Little Help from another person toileting, which includes using toliet, bedpan, or urinal?: A Lot Help from another person bathing (including washing, rinsing, drying)?: A Lot Help from another person to put on and taking off regular upper body clothing?: A Little Help from another person to put on and taking off regular lower body clothing?: A Lot 6 Click Score: 16    End of Session    OT Visit Diagnosis: Other abnormalities of gait and mobility (R26.89);Muscle weakness (generalized) (M62.81);Pain Pain - Right/Left: Right Pain - part of body: Ankle and joints of foot   Activity Tolerance Patient tolerated treatment well   Patient Left in chair;with call bell/phone within reach;with chair alarm set   Nurse Communication Mobility status;Precautions;Weight bearing status        Time: 1122-1140 OT Time Calculation (min): 18 min  Charges: OT General Charges $OT Visit: 1 Visit OT Treatments $Therapeutic Activity: 8-22 mins  Jackquline Denmark, MS, OTR/L , CBIS ascom [phone]  11/25/22, 3:23 PM

## 2022-11-25 NOTE — Plan of Care (Signed)

## 2022-11-25 NOTE — Progress Notes (Signed)
TEE is going to be rescheduled for tomorrow. Nurse notified.

## 2022-11-25 NOTE — TOC Progression Note (Addendum)
Transition of Care [facility]) - Progression Note    Patient Details  Name: Alexander Gill MRN: [id number] Date of Birth: Jan 24, 1956  Transition of Care [facility]) CM/SW Contact  Chapman Fitch, RN Phone Number: 11/25/2022, 12:11 PM  Clinical Narrative:     Plan for TEE tomorrow Bed offers reviewed with patient.  Based on current offers patient chooses [facility].  If [facility] can offer he would prefer then.  Tammy at [facility] still reviewing   Update: [facility] unable to offer.  Patient updated.  Accepted bed at [facility] in [place] and notified Kenney Houseman at [facility].  Facility will have to start auth.  Messages sent to MD to determine when to start auth  Update:  per MD appropriate to start auth.  Kenney Houseman at [facility] notified to start auth Expected Discharge Plan: Skilled Nursing Facility Barriers to Discharge: Continued Medical Work up  Expected Discharge Plan and Services       Living arrangements for the past 2 months: Single Family Home                                       Social Determinants of Health (SDOH) Interventions SDOH Screenings   Food Insecurity: No Food Insecurity (11/21/2022)  Housing: Low Risk  (11/21/2022)  Transportation Needs: No Transportation Needs (11/21/2022)  Utilities: Not At Risk (11/21/2022)  Depression (PHQ2-9): Low Risk  (11/13/2021)  Tobacco Use: Low Risk  (11/25/2022)    Readmission Risk Interventions     No data to display

## 2022-11-25 NOTE — Progress Notes (Signed)
Physical Therapy Treatment Patient Details Name: Alexander Gill MRN: [id number] DOB: 12-Sep-1955 Today's Date: 11/25/2022   History of Present Illness Pt admitted to [facility] on 07/04/21 for concern of R foot infection s/p L transmet amputation. 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 Alberteen Spindle. Significant PMH includes: DM, cataracts, HTN, hypothyroidism.    PT Comments    Patient in recliner on arrival and agreeable to therapy session. Educated and demonstrated on wheelchair transfer with slideboard, patient verbalized understanding. Patient required assistance for transfer setup and minA for transfer to w/c due to posterior LOB. +2 for safety due to slight impulsivity with transfer and bracing w/c. Required frequent cueing for hand placement, anterior weight shift, and technique. On transfer back to bed, patient decided to log roll from w/c to bed and grasp bedrail to pull self back into bed. Discussed how this is an unsafe technique and patient states "I just need time and have to get better at it". Discharge plan remains appropriate.      Recommendations for follow up therapy are one component of a multi-disciplinary discharge planning process, led by the attending physician.  Recommendations may be updated based on patient status, additional functional criteria and insurance authorization.  Follow Up Recommendations  Can patient physically be transported by private vehicle: No    Assistance Recommended at Discharge Frequent or constant Supervision/Assistance  Patient can return home with the following A lot of help with bathing/dressing/bathroom;Assistance with cooking/housework;Direct supervision/assist for medications management;Assist for transportation;Help with stairs or ramp for entrance;A lot of help with walking and/or transfers   Equipment Recommendations  Wheelchair (measurements PT);Wheelchair cushion (measurements PT) (slide board, elevating leg rests)    Recommendations for  Other Services       Precautions / Restrictions Precautions Precautions: Fall Restrictions Weight Bearing Restrictions: Yes RLE Weight Bearing: Non weight bearing LLE Weight Bearing: Non weight bearing Other Position/Activity Restrictions: per pt, MD stated he could use his R foot in cam boot for "one step"     Mobility  Bed Mobility Overal bed mobility: Needs Assistance Bed Mobility: Sit to Supine       Sit to supine: Supervision        Transfers Overall transfer level: Needs assistance Equipment used: Sliding board Transfers: Bed to chair/wheelchair/BSC            Lateral/Scoot Transfers: Min assist, +2 safety/equipment, With slide board General transfer comment: visual demonstration of sliding board transfer. Required verbal cues for anterior weight shift, hand placement, and overall technique. Required minA to transfer to w/c from recliner with +2 for safety as patient with posterior LOB on transfer. On transfer back to bed via slideboard from w/c, patient disregarded previously shown technique with slideboard and chose to roll onto bed from w/c and grasp bed rail to pull self into bed    Ambulation/Gait                   Stairs             Wheelchair Mobility    Modified Rankin (Stroke Patients Only)       Balance Overall balance assessment: Needs assistance Sitting-balance support: Feet supported Sitting balance-Leahy Scale: Fair Sitting balance - Comments: dynamically fair due to posterior LOB during t/f                                    Cognition Arousal/Alertness: Awake/alert Behavior  During Therapy: WFL for tasks assessed/performed Overall Cognitive Status: Within Functional Limits for tasks assessed                                 General Comments: slight impulsivity with sliding board transfers        Exercises      General Comments        Pertinent Vitals/Pain Pain Assessment Pain  Assessment: Faces Faces Pain Scale: Hurts a little bit Pain Location: L foot with mobility Pain Descriptors / Indicators: Sore Pain Intervention(s): Monitored during session    Home Living                          Prior Function            PT Goals (current goals can now be found in the care plan section) Acute Rehab PT Goals PT Goal Formulation: With patient Time For Goal Achievement: 12/07/22 Potential to Achieve Goals: Good Progress towards PT goals: Progressing toward goals    Frequency    Min 4X/week      PT Plan Current plan remains appropriate    Co-evaluation              AM-PAC PT "6 Clicks" Mobility   Outcome Measure  Help needed turning from your back to your side while in a flat bed without using bedrails?: A Little Help needed moving from lying on your back to sitting on the side of a flat bed without using bedrails?: A Little Help needed moving to and from a bed to a chair (including a wheelchair)?: A Little Help needed standing up from a chair using your arms (e.g., wheelchair or bedside chair)?: Total Help needed to walk in hospital room?: Total Help needed climbing 3-5 steps with a railing? : Total 6 Click Score: 12    End of Session   Activity Tolerance: Patient tolerated treatment well Patient left: in bed;with call bell/phone within reach;with bed alarm set Nurse Communication: Mobility status PT Visit Diagnosis: Other abnormalities of gait and mobility (R26.89);Muscle weakness (generalized) (M62.81);Pain Pain - Right/Left: Right Pain - part of body: Ankle and joints of foot     Time: 1430-1456 PT Time Calculation (min) (ACUTE ONLY): 26 min  Charges:  $Therapeutic Activity: 23-37 mins                     Alexander Gill, PT, DPT Physical Therapist - [facility]  Alexander Gill    Alexander Gill 11/25/2022, 3:09 PM

## 2022-11-25 NOTE — Progress Notes (Signed)
Progress Note   Patient: Alexander Gill ZOX:[id number] DOB: 01/16/1956 DOA: 11/20/2022     5 DOS: the patient was seen and examined on 11/25/2022    Subjective Patient seen and examined at bedside this morning Apparently had his TEE scheduled but because of an emergency it got canceled today He denies chest pain nausea vomiting fever or abdominal pain    Brief Narrative / Hospital Course:  Alexander Gill is a 67 y.o. 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  05/03:  WBC 19.4, GFR> 60, potassium 3.4, temperature 101.5, blood pressure 120/62, heart rate 93, RR 20, oxygen saturation 99% on room air.  Patient is admitted to [facility] for psteomyelitis, cellulitis w/ abscess L foot.  Dr. Alberteen Spindle for podiatry was consulted. L foot abscess cultured. Plan surgery tomorrow for amputation of the second ray with potentially the first ray as well.  05/04: surgery was on schedule but had to be deferred d/t power outage in the hospital. Pt stable. WBC trending down. (+)BCx gram pos cocci - ID panel (+)staph --> abx narrowed to cefazolin  05/05: Underwent Transmetatarsal amputation left foot w/ Dr Alberteen Spindle. Repeat BCx drawn. Plan ID consult tomorrow to guide further IV abx, awaiting surgical culture/pathology. Pt is NWB, will need PT and possible SNF/STR. 05/06: awaiting susceptibilities from initial cultures. Repeat cultures NG thus far. Echo ordered. Will need TEE. ID on board.  05/07: repeat BCx neg. Initial BCx pan-sensitive staph aureus. On surgical pathology, bone at proximal margin negative for osteomyelitis. Echo EF 60-65%. (+)Moderate vegetation on the mitral valve --> TEE.    Consultants:  Podiatry  Infectious Disease  Cardiology   Procedures: 05/05: Transmetatarsal amputation left foot w/ Dr Alberteen Spindle.            ASSESSMENT & PLAN:   Principal Problem:   Osteomyelitis of left foot (HCC) Active Problems:   Cellulitis of right lower extremity   Sepsis (HCC)   Type 2 diabetes mellitus with diabetic foot ulcer (HCC)   Hypertension   HLD (hyperlipidemia)   Hypothyroidism   Hypokalemia   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, need TEE to confirm       Sepsis due to left foot osteomyelitis and cellulitis of right lower extremity Staph aureus bacteremia pan-sensitive Concern for mitral valve vegetation on TTE  Sepsis parameters have resolved  S/p Transmetatarsal amputation left foot w/ Dr Alberteen Spindle. Abx: 05/03 Zosyn in ED --> 05/03 vancomycin, Flagyl, Rocephin --(pos BCx 05/04)--> cefazolin  continue on cefazolin 2gm iv q 8hr.  PICC line requested TTE ordered, (+)concern for mitral valve vegetation, will need TEE PRN Zofran for nausea tylenol and oxycodone for pain Underwent Transmetatarsal amputation left foot w/ Dr Alberteen Spindle 11/22/22.  ID following to guide further IV abx Pt is NWB, will need PT --> SNF/STR. Repeat BCx drawn 05/05   MRSA colonization mupirocin bid plus chg bathing x 5 days    Type 2 diabetes mellitus with diabetic foot ulcer (HCC):  Recent A1c 8.2, poorly controlled.   Patient is taking Xigduo XR, semaglutide and glargine insulin 36 units daily Glargine insulin 24 Units daily  SSI   Hypertension: Hold home blood pressure medications: Atenolol, Hyzaar since patient at risk of developing hypotension due to sepsis IV hydralazine as needed   HLD (hyperlipidemia) Crestor   Hypothyroidism Synthroid   Hypokalemia Replace as needed Monitor BMP  DVT prophylaxis: heparin   Code Status: FULL CODE   Current Admission Status: inpatient   TOC needs / Dispo plan: SNF, IV abx Barriers to discharge / significant pending items: needs TEE, pending final IV abx recs       Physical Exam Constitutional:      General: He is not in acute distress. Cardiovascular:     Rate and  Rhythm: Normal rate and regular rhythm.  Pulmonary:     Effort: Pulmonary effort is normal.     Breath sounds: Normal breath sounds.  Neurological:     General: No focal deficit present.     Mental Status: He is alert.  Psychiatric:        Mood and Affect: Mood normal.        Behavior: Behavior normal.         Data Reviewed: I personally reviewed patient's laboratory results today showing sodium 139 potassium 3.5 glucose 146 and WBC 12.8 and hemoglobin 11  Family Communication: No family at bedside today  Time spent: 45 minutes   Vitals:   11/25/22 0500 11/25/22 0520 11/25/22 0809 11/25/22 0901  BP:  125/79 (!) 144/83 (!) 144/73  Pulse:  60 63 65  Resp:  18 16 18   Temp:  98 F (36.7 C) 98.9 F (37.2 C) 98.7 F (37.1 C)  TempSrc:   Oral Oral  SpO2:  97% 95% 93%  Weight: 87.9 kg   87.9 kg  Height:    6\' 1"  (1.854 m)      Author: Loyce Dys, MD 11/25/2022 5:48 PM  For on call review [email or web address].

## 2022-11-25 NOTE — Progress Notes (Signed)
PT Cancellation Note  Patient Details Name: Alexander Gill MRN: [id number] DOB: 1955/12/11   Cancelled Treatment:    Reason Eval/Treat Not Completed: Patient at procedure or test/unavailable Patient off unit undergoing TEE. Will re-attempt at later date/time as appropriate.   [name], PT, DPT Physical Therapist - [facility]    [name] [name] 11/25/2022, 10:19 AM

## 2022-11-26 ENCOUNTER — Inpatient Hospital Stay (HOSPITAL_COMMUNITY)
Admit: 2022-11-26 | Discharge: 2022-11-26 | Disposition: A | Discharge status: Home / Self Care | Payer: BC Managed Care – PPO | Attending: Medical | Admitting: Medical

## 2022-11-26 ENCOUNTER — Encounter
Admission: EM | Disposition: A | Discharge status: Skilled Nursing Facility | Payer: Self-pay | Source: Home / Self Care | Attending: Osteopathic Medicine

## 2022-11-26 DIAGNOSIS — I330 Acute and subacute infective endocarditis: Secondary | ICD-10-CM

## 2022-11-26 DIAGNOSIS — I340 Nonrheumatic mitral (valve) insufficiency: Secondary | ICD-10-CM

## 2022-11-26 DIAGNOSIS — M86172 Other acute osteomyelitis, left ankle and foot: Secondary | ICD-10-CM | POA: Diagnosis not present

## 2022-11-26 DIAGNOSIS — R7881 Bacteremia: Secondary | ICD-10-CM | POA: Diagnosis not present

## 2022-11-26 DIAGNOSIS — B9561 Methicillin susceptible Staphylococcus aureus infection as the cause of diseases classified elsewhere: Secondary | ICD-10-CM | POA: Diagnosis not present

## 2022-11-26 HISTORY — PX: TEE WITHOUT CARDIOVERSION: SHX5443

## 2022-11-26 LAB — CBC WITH DIFFERENTIAL/PLATELET
Abs Immature Granulocytes: 0.12 10*3/uL — ABNORMAL HIGH (ref 0.00–0.07)
Basophils Absolute: 0 10*3/uL (ref 0.0–0.1)
Basophils Relative: 0 %
Eosinophils Absolute: 0.4 10*3/uL (ref 0.0–0.5)
Eosinophils Relative: 3 %
HCT: 33.9 % — ABNORMAL LOW (ref 39.0–52.0)
Hemoglobin: 10.9 g/dL — ABNORMAL LOW (ref 13.0–17.0)
Immature Granulocytes: 1 %
Lymphocytes Relative: 11 %
Lymphs Abs: 1.4 10*3/uL (ref 0.7–4.0)
MCH: 27.9 pg (ref 26.0–34.0)
MCHC: 32.2 g/dL (ref 30.0–36.0)
MCV: 86.9 fL (ref 80.0–100.0)
Monocytes Absolute: 0.8 10*3/uL (ref 0.1–1.0)
Monocytes Relative: 6 %
Neutro Abs: 10.7 10*3/uL — ABNORMAL HIGH (ref 1.7–7.7)
Neutrophils Relative %: 79 %
Platelets: 437 10*3/uL — ABNORMAL HIGH (ref 150–400)
RBC: 3.9 MIL/uL — ABNORMAL LOW (ref 4.22–5.81)
RDW: 15.3 % (ref 11.5–15.5)
WBC: 13.5 10*3/uL — ABNORMAL HIGH (ref 4.0–10.5)
nRBC: 0 % (ref 0.0–0.2)

## 2022-11-26 LAB — GLUCOSE, CAPILLARY
Glucose-Capillary: 145 mg/dL — ABNORMAL HIGH (ref 70–99)
Glucose-Capillary: 145 mg/dL — ABNORMAL HIGH (ref 70–99)
Glucose-Capillary: 193 mg/dL — ABNORMAL HIGH (ref 70–99)
Glucose-Capillary: 309 mg/dL — ABNORMAL HIGH (ref 70–99)

## 2022-11-26 LAB — BASIC METABOLIC PANEL
Anion gap: 6 (ref 5–15)
BUN: 12 mg/dL (ref 8–23)
CO2: 26 mmol/L (ref 22–32)
Calcium: 7.8 mg/dL — ABNORMAL LOW (ref 8.9–10.3)
Chloride: 104 mmol/L (ref 98–111)
Creatinine, Ser: 0.72 mg/dL (ref 0.61–1.24)
GFR, Estimated: >60 mL/min (ref >60)
Glucose, Bld: 151 mg/dL — ABNORMAL HIGH (ref 70–99)
Potassium: 3.6 mmol/L (ref 3.5–5.1)
Sodium: 136 mmol/L (ref 135–145)

## 2022-11-26 LAB — ECHO TEE

## 2022-11-26 SURGERY — ECHOCARDIOGRAM, TRANSESOPHAGEAL
Anesthesia: Moderate Sedation

## 2022-11-26 MED ORDER — BUTAMBEN-TETRACAINE-BENZOCAINE 2-2-14 % EX AERO
INHALATION_SPRAY | CUTANEOUS | Status: AC
  Filled 2022-11-26: qty 5

## 2022-11-26 MED ORDER — FENTANYL CITRATE (PF) 100 MCG/2ML IJ SOLN
INTRAMUSCULAR | Status: AC
  Filled 2022-11-26: qty 2

## 2022-11-26 MED ORDER — SODIUM CHLORIDE 0.9% FLUSH
10.0000 mL | Freq: Two times a day (BID) | INTRAVENOUS | Status: DC
  Administered 2022-11-26 – 2022-11-27 (×2): 10 mL

## 2022-11-26 MED ORDER — LIDOCAINE VISCOUS HCL 2 % MT SOLN
OROMUCOSAL | Status: AC
  Filled 2022-11-26: qty 15

## 2022-11-26 MED ORDER — LIDOCAINE VISCOUS HCL 2 % MT SOLN
OROMUCOSAL | Status: AC | PRN
  Administered 2022-11-26: 15 mL via OROMUCOSAL

## 2022-11-26 MED ORDER — CHLORHEXIDINE GLUCONATE CLOTH 2 % EX PADS
6.0000 | MEDICATED_PAD | Freq: Every day | CUTANEOUS | Status: DC
  Administered 2022-11-26 – 2022-11-27 (×2): 6 via TOPICAL

## 2022-11-26 MED ORDER — SODIUM CHLORIDE 0.9% FLUSH: 10.0000 mL | INTRAVENOUS | Status: DC | PRN

## 2022-11-26 MED ORDER — MIDAZOLAM HCL 5 MG/5ML IJ SOLN
INTRAMUSCULAR | Status: AC | PRN
  Administered 2022-11-26 (×3): 1 mg via INTRAVENOUS

## 2022-11-26 MED ORDER — FENTANYL CITRATE (PF) 100 MCG/2ML IJ SOLN
INTRAMUSCULAR | Status: AC | PRN
  Administered 2022-11-26 (×3): 25 ug via INTRAVENOUS

## 2022-11-26 MED ORDER — SODIUM CHLORIDE 0.9 % IV SOLN: INTRAVENOUS | Status: DC

## 2022-11-26 MED ORDER — MIDAZOLAM HCL 2 MG/2ML IJ SOLN
INTRAMUSCULAR | Status: AC
  Filled 2022-11-26: qty 4

## 2022-11-26 MED ORDER — BUTAMBEN-TETRACAINE-BENZOCAINE 2-2-14 % EX AERO
INHALATION_SPRAY | CUTANEOUS | Status: AC | PRN
  Administered 2022-11-26: 1 via TOPICAL

## 2022-11-26 NOTE — Progress Notes (Addendum)
Progress Note   Patient: Alexander Gill AVW:[id number] DOB: 09/15/1955 DOA: 11/20/2022     6 DOS: the patient was seen and examined on 11/26/2022    Subjective Patient seen and examined at bedside this morning Underwent TEE today that showed moderate size valvular vegetation on the posterior leaflet of the mitral valve consistent with infective endocarditis with mild mitral regurgitation. Patient has been discussed with cardiologist as well as infectious disease attending. Patient awaiting PICC line placement for long-term antibiotic therapy He denies chest pain nausea vomiting fever or abdominal pain    Brief Narrative / Hospital Course:  Alexander Gill is a 67 y.o. 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  05/03:  WBC 19.4, GFR> 60, potassium 3.4, temperature 101.5, blood pressure 120/62, heart rate 93, RR 20, oxygen saturation 99% on room air.  Patient is admitted to MedSurg for psteomyelitis, cellulitis w/ abscess L foot.  Dr. Alberteen Spindle for podiatry was consulted. L foot abscess cultured. Plan surgery tomorrow for amputation of the second ray with potentially the first ray as well.  05/04: surgery was on schedule but had to be deferred d/t power outage in the hospital. Pt stable. WBC trending down. (+)BCx gram pos cocci - ID panel (+)staph --> abx narrowed to cefazolin  05/05: Underwent Transmetatarsal amputation left foot w/ Dr Alberteen Spindle. Repeat BCx drawn. Plan ID consult tomorrow to guide further IV abx, awaiting surgical culture/pathology. Pt is NWB, will need PT and possible SNF/STR. 05/06: awaiting susceptibilities from initial cultures. Repeat cultures NG thus far. Echo ordered. Will need TEE. ID on board.  05/07: repeat BCx neg. Initial BCx pan-sensitive staph aureus. On surgical pathology, bone at  proximal margin negative for osteomyelitis. Echo EF 60-65%. (+)Moderate vegetation on the mitral valve --> TEE.    Consultants:  Podiatry  Infectious Disease  Cardiology   Procedures: 05/05: Transmetatarsal amputation left foot w/ Dr Alberteen Spindle. 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 Abx: 05/03 Zosyn in ED --> 05/03 vancomycin, Flagyl, Rocephin --(pos BCx 05/04)--> cefazolin  continue on cefazolin 2gm iv q 8hr.  PICC line requested TTE ordered, (+)concern for mitral valve vegetation Underwent TEE on 11/26/2022 that showed moderate size valvular vegetation on the posterior leaflet of the mitral valve consistent with infective endocarditis with mild mitral regurgitation. Patient has been discussed with cardiologist as well as infectious disease attending. PRN Zofran for nausea tylenol and oxycodone for pain Underwent Transmetatarsal amputation left foot w/ Dr Alberteen Spindle 11/22/22.  ID following to guide further IV abx Repeat BCx drawn 05/05   MRSA colonization mupirocin bid plus chg bathing x 5 days    Type 2 diabetes mellitus with diabetic foot ulcer (HCC):  Recent A1c 8.2, poorly controlled.   Patient is taking Xigduo XR, semaglutide and glargine insulin 36 units daily Glargine insulin 24 Units daily  SSI   Hypertension: Hold home blood  pressure medications: Atenolol, Hyzaar since patient at risk of developing hypotension due to sepsis IV hydralazine as needed   HLD (hyperlipidemia) Crestor   Hypothyroidism Synthroid   Hypokalemia Replace as needed Monitor BMP      DVT prophylaxis: heparin    Code Status: FULL CODE   Current Admission Status: inpatient    TOC needs / Dispo plan: SNF, IV abx  Barriers to discharge / significant pending items: Pending PICC line placement as well as IV antibiotics    Physical Exam Constitutional:      General: He is not in acute distress. Cardiovascular:     Rate and Rhythm: Normal rate and regular rhythm.  Pulmonary:     Effort: Pulmonary effort is normal.     Breath sounds: Normal breath sounds.  Neurological:     General: No focal deficit present.     Mental Status: He is alert.  Psychiatric:        Mood and Affect: Mood normal.        Behavior: Behavior normal.         Data Reviewed: I personally reviewed patient's laboratory results today showing sodium sodium 136 potassium 3.6 glucose 151 WBC 13.5 hemoglobin 10.9   Family Communication: No family at bedside today   Time spent: 42 minutes   Vitals:   11/26/22 1316 11/26/22 1320 11/26/22 1330 11/26/22 1345  BP: 128/60 134/66 (!) 142/64 (!) 145/70  Pulse: 64 81 68 63  Resp: (!) 21 19 17 19   Temp:      TempSrc:      SpO2: 93% 97% 97% 93%  Weight:      Height:         Author: Loyce Dys, MD 11/26/2022 2:37 PM  For on call review [email or web address].

## 2022-11-26 NOTE — Progress Notes (Signed)
At bedside for PICC placement.  Pt currently eating supper, requests to return.  Wife at bedside.

## 2022-11-26 NOTE — Progress Notes (Signed)
PHARMACY CONSULT NOTE FOR:  OUTPATIENT  PARENTERAL ANTIBIOTIC THERAPY (OPAT)  Indication: MSSA bacteremia/mitral valve endocarditis  Regimen: Cefazolin 2 grams iV every 8 hours  End date: 01/02/23   IV antibiotic discharge orders are pended. To discharging provider:  please sign these orders via discharge navigator,  Select New Orders & click on the button choice - Manage This Unsigned Work.     Thank you for allowing pharmacy to be a part of this patient's care.  Barrie Folk 11/26/2022, 4:49 PM

## 2022-11-26 NOTE — Treatment Plan (Signed)
Diagnosis: MSSA bacteremia/mitral valve endocarditis Diabetic foot infection left s/p TMA Baseline Creatinine <1   No Known Allergies  OPAT Orders Cefazolin 2 grams iV every 8 hours Duration: 6 weeks End Date: 01/02/23  [facility] Care Per Protocol:  Labs weekly while on IV antibiotics: _X_ CBC with differential  _X_ CMP  X ESR  X CRP   _X_ Please pull PIC at completion of IV antibiotics   Fax weekly lab results  promptly to [phone]  Clinic Follow Up Appt: with Dr.Grady Mohabir 12/24/22 at 10.30AM   Call [phone] with any questions or critical value

## 2022-11-26 NOTE — Progress Notes (Signed)
Transesophageal Echocardiogram :  Indication: Bacteremia, rule out endocarditis Requesting/ordering  physician: Dr. Meriam Sprague  Procedure: Benzocaine spray x2 and 2 mls x 2 of viscous lidocaine were given orally to provide local anesthesia to the oropharynx. The patient was positioned supine on the left side, bite block provided. The patient was moderately sedated with the doses of versed and fentanyl as detailed below.  Using digital technique an omniplane probe was advanced into the distal esophagus without incident.   Moderate sedation: 1. Sedation used:  Versed: 3 mg IV push, Fentanyl: 75 mcg IV 2. Time administered: 12:30 PM   time when patient started recovery:1:00 p.m. Total sedation time 30 minutes 3. I was face to face during this time  See report in EPIC  for complete details: In brief,  Finding consistent with 'cauliflower" appearing vegetation noted on posterior leaflet of mitral valve, 1 x 1.3 cm.  Minimally mobile.  Mild MR transgastric imaging revealed normal LV function with no RWMAs and no mural apical thrombus.  .  Estimated ejection fraction was 55%.  Right sided cardiac chambers were normal with no evidence of pulmonary hypertension.  Imaging of the septum showed no ASD or VSD Bubble study was negative for shunt 2D and color flow confirmed no PFO  The LA was well visualized in orthogonal views.  There was no spontaneous contrast and no thrombus in the LA and LA appendage   The descending thoracic aorta had no  mural aortic debris with no evidence of aneurysmal dilation or dissection.  Mild diffuse aortic atherosclerosis   Julien Nordmann 11/26/2022 2:06 PM

## 2022-11-26 NOTE — Progress Notes (Signed)
Day of Surgery   Subjective/Chief Complaint: Patient seen.  States overall he feels like he is doing better.  Anticipating going to skilled nursing possibly tomorrow.   Objective: Vital signs in last 24 hours: Temp:  [98.4 F (36.9 C)-99.2 F (37.3 C)] 98.7 F (37.1 C) (05/09 1649) Pulse Rate:  [60-81] 76 (05/09 1649) Resp:  [17-22] 18 (05/09 1649) BP: (128-158)/(57-79) 131/61 (05/09 1649) SpO2:  [88 %-98 %] 93 % (05/09 1649) Weight:  [88.2 kg] 88.2 kg (05/09 1143) Last BM Date : 11/22/22  Intake/Output from previous day: 05/08 0701 - 05/09 0700 In: 945.7 [P.O.:720; I.V.:25.7; IV Piggyback:200] Out: 2350 [Urine:2350] Intake/Output this shift: Total I/O In: 400 [IV Piggyback:400] Out: 1000 [Urine:1000]  Bandage on the left foot is dry and intact.  Upon removal minimal dried blood.  The incision is well coapted with staples intact and wound edges viable.  Edema has reduced some.  Still some significant edema in the right foot with increased skin temperature but redness has improved.  Lab Results:  Recent Labs    11/25/22 0645 11/26/22 0717  WBC 12.8* 13.5*  HGB 11.0* 10.9*  HCT 34.5* 33.9*  PLT 408* 437*   BMET Recent Labs    11/25/22 0645 11/26/22 0717  NA 139 136  K 3.5 3.6  CL 107 104  CO2 26 26  GLUCOSE 146* 151*  BUN 12 12  CREATININE 0.74 0.72  CALCIUM 7.8* 7.8*   PT/INR No results for input(s): "LABPROT", "INR" in the last 72 hours. ABG No results for input(s): "PHART", "HCO3" in the last 72 hours.  Invalid input(s): "PCO2", "PO2"  Studies/Results: No results found.  Anti-infectives: Anti-infectives (From admission, onward)    Start     Dose/Rate Route Frequency Ordered Stop   11/21/22 1400  ceFAZolin (ANCEF) IVPB 2g/100 mL premix        2 g 200 mL/hr over 30 Minutes Intravenous Every 8 hours 11/21/22 1006     11/20/22 2200  cefTRIAXone (ROCEPHIN) 2 g in sodium chloride 0.9 % 100 mL IVPB  Status:  Discontinued        2 g 200 mL/hr over 30  Minutes Intravenous Every 24 hours 11/20/22 1509 11/21/22 1006   11/20/22 2200  metroNIDAZOLE (FLAGYL) IVPB 500 mg  Status:  Discontinued        500 mg 100 mL/hr over 60 Minutes Intravenous Every 12 hours 11/20/22 1509 11/23/22 1029   11/20/22 1445  vancomycin (VANCOCIN) IVPB 1000 mg/200 mL premix  Status:  Discontinued        1,000 mg 200 mL/hr over 60 Minutes Intravenous  Once 11/20/22 1432 11/20/22 1434   11/20/22 1445  piperacillin-tazobactam (ZOSYN) IVPB 3.375 g        3.375 g 100 mL/hr over 30 Minutes Intravenous  Once 11/20/22 1432 11/20/22 1548   11/20/22 1445  vancomycin (VANCOREADY) IVPB 2000 mg/400 mL        2,000 mg 200 mL/hr over 120 Minutes Intravenous  Once 11/20/22 1434 11/20/22 1728       Assessment/Plan: s/p Procedure(s): TRANSESOPHAGEAL ECHOCARDIOGRAM (N/A) Assessment: 1.  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  2.  Charcot arthropathy right foot.  Plan: Betadine gauze applied to the incision followed by a sterile gauze bandage and Ace wrap.  Patient instructed to keep his left foot clean, dry, and does not need to change or remove the bandage at this point.  Discussed that he needs to continue nonweightbearing on both lower extremities.  Stable for discharge from  podiatry standpoint.  Plan for follow-up next week outpatient for follow-up  LOS: 6 days    Ricci Barker 11/26/2022

## 2022-11-26 NOTE — Progress Notes (Signed)
*  PRELIMINARY RESULTS* Echocardiogram 2D Echocardiogram has been performed.  Alexander Gill 11/26/2022, 1:31 PM

## 2022-11-26 NOTE — Progress Notes (Signed)
Date of Admission:  11/20/2022     ID: Alexander Gill is a 67 y.o. male  Principal Problem:   Osteomyelitis of left foot (HCC) Active Problems:   Hypertension   HLD (hyperlipidemia)   Hypothyroidism   Cellulitis of right lower extremity   Sepsis (HCC)   Type 2 diabetes mellitus with diabetic foot ulcer (HCC)   Hypokalemia   Mitral valve endocarditis    Subjective: Pt had TEE this afternoon Feeling hungry Otherwise okay  Medications:   [MAR Hold] vitamin C  250 mg Oral BID   [MAR Hold] aspirin EC  81 mg Oral Daily   butamben-tetracaine-benzocaine       [MAR Hold] cholecalciferol  1,000 Units Oral Daily   [MAR Hold] feeding supplement (GLUCERNA SHAKE)  237 mL Oral BID BM   fentaNYL       [MAR Hold] heparin  5,000 Units Subcutaneous Q8H   [MAR Hold] insulin aspart  0-5 Units Subcutaneous QHS   [MAR Hold] insulin aspart  0-9 Units Subcutaneous TID WC   [MAR Hold] insulin glargine-yfgn  24 Units Subcutaneous QHS   [MAR Hold] levothyroxine  25 mcg Oral Daily   midazolam       [MAR Hold] multivitamin with minerals  1 tablet Oral Daily   [MAR Hold] rosuvastatin  20 mg Oral Daily    Objective: Vital signs in last 24 hours: Patient Vitals for the past 24 hrs:  BP Temp Temp src Pulse Resp SpO2 Height Weight  11/26/22 1320 134/66 -- -- 81 19 97 % -- --  11/26/22 1316 128/60 -- -- 64 (!) 21 93 % -- --  11/26/22 1311 132/64 -- -- 63 (!) 21 94 % -- --  11/26/22 1306 134/66 -- -- 67 (!) 22 95 % -- --  11/26/22 1301 (!) 134/57 -- -- 63 (!) 22 (!) 88 % -- --  11/26/22 1256 136/69 -- -- 71 18 95 % -- --  11/26/22 1236 (!) 158/73 -- -- 61 (!) 22 96 % -- --  11/26/22 1143 -- 98.4 F (36.9 C) Oral -- 20 95 % 6\' 1"  (1.854 m) 88.2 kg  11/26/22 0803 132/79 98.5 F (36.9 C) -- 62 18 98 % -- --  11/26/22 0500 -- -- -- -- -- -- -- 88.2 kg  11/26/22 0415 132/76 98.4 F (36.9 C) -- 60 18 97 % -- --  11/25/22 2059 (!) 150/79 99.2 F (37.3 C) Oral 67 18 95 % -- --     PHYSICAL EXAM:   General: Alert, cooperative, no distress, appears stated age.  Head: Normocephalic, without obvious abnormality, atraumatic. Eyes: Conjunctivae clear, anicteric sclerae. Pupils are equal ENT Nares normal. No drainage or sinus tenderness. Lips, mucosa, and tongue normal. No Thrush Neck: Supple, symmetrical, no adenopathy, thyroid: non tender no carotid bruit and no JVD. Back: No CVA tenderness. Lungs: Clear to auscultation bilaterally. No Wheezing or Rhonchi. No rales. Heart: Regular rate and rhythm, no murmur, rub or gallop. Abdomen: Soft, non-tender,not distended. Bowel sounds normal. No masses Extremities:left foot dressing     Skin: No rashes or lesions. Or bruising Lymph: Cervical, supraclavicular normal. Neurologic: Grossly non-focal  Lab Results    Latest Ref Rng & Units 11/26/2022    7:17 AM 11/25/2022    6:45 AM 11/21/2022    6:09 AM  CBC  WBC 4.0 - 10.5 K/uL 13.5  12.8  13.1   Hemoglobin 13.0 - 17.0 g/dL 16.1  09.6  04.5   Hematocrit 39.0 - 52.0 %  33.9  34.5  32.8   Platelets 150 - 400 K/uL 437  408  348        Latest Ref Rng & Units 11/26/2022    7:17 AM 11/25/2022    6:45 AM 11/21/2022    6:09 AM  CMP  Glucose 70 - 99 mg/dL 161  096  045   BUN 8 - 23 mg/dL 12  12  20    Creatinine 0.61 - 1.24 mg/dL 4.09  8.11  9.14   Sodium 135 - 145 mmol/L 136  139  138   Potassium 3.5 - 5.1 mmol/L 3.6  3.5  3.3   Chloride 98 - 111 mmol/L 104  107  108   CO2 22 - 32 mmol/L 26  26  22    Calcium 8.9 - 10.3 mg/dL 7.8  7.8  7.8       Microbiology: 11/22/22 BC- NG Studies/Results: [place] EKG SITE RITE  Result Date: 11/24/2022 If Site Rite image not attached, placement could not be confirmed due to current cardiac rhythm.    Assessment/Plan: MSSA bacteremia Secondary to left foot infection s/p TMA TEE shows mitral valve endocarditis Pt will need 6 weeks of Iv antibiotic Cefazolin 2 grams IV every 8 hours  DM- on insulin  HLD on rosuvastatin  Hypothyroidism- on synthroud

## 2022-11-26 NOTE — Progress Notes (Signed)
PICC consent obtained from pt.  States he has had a PICC in the past and is very familiar with them.  PICC consent signed.  Pt does state he wants to wait until after the TEE procedure is done for PICC placement.  Bianca RN notified of pt request via secure chat to notify this nurse once returns.

## 2022-11-26 NOTE — TOC Progression Note (Signed)
Transition of Care [facility]) - Progression Note    Patient Details  Name: Alexander Gill MRN: [id number] Date of Birth: January 26, 1956  [facility]) CM/SW Contact  Chapman Fitch, RN Phone Number: 11/26/2022, 4:07 PM  Clinical Narrative:     Per Kenney Houseman with [facility] they have auth for patient to admit Per MD patient will require IV antibiotics, waiting on final recs from ID.  [facility] is aware   Expected Discharge Plan: Skilled Nursing Facility Barriers to Discharge: Continued Medical Work up  Expected Discharge Plan and Services       Living arrangements for the past 2 months: Single Family Home                                       Social Determinants of Health (SDOH) Interventions SDOH Screenings   Food Insecurity: No Food Insecurity (11/21/2022)  Housing: Low Risk  (11/21/2022)  Transportation Needs: No Transportation Needs (11/21/2022)  Utilities: Not At Risk (11/21/2022)  Depression (PHQ2-9): Low Risk  (11/13/2021)  Tobacco Use: Low Risk  (11/25/2022)    Readmission Risk Interventions     No data to display

## 2022-11-26 NOTE — Progress Notes (Signed)
PT Cancellation Note  Patient Details Name: Alexander Gill MRN: [id number] DOB: 1955/11/26   Cancelled Treatment:    Reason Eval/Treat Not Completed: Patient at procedure or test/unavailable Patient off unit at TEE. Will re-attempt at later date/time as appropriate.   [name], PT, DPT Physical Therapist - [facility]  [facility]    Nikia Mangino A Dayja Loveridge 11/26/2022, 11:43 AM

## 2022-11-26 NOTE — TOC Progression Note (Signed)
Transition of Care [facility]) - Progression Note    Patient Details  Name: Jabaree Strantz MRN: [id number] Date of Birth: 23-Feb-1956  Transition of Care [facility]) CM/SW Contact  Chapman Fitch, RN Phone Number: 11/26/2022, 11:31 AM  Clinical Narrative:    Kenney Houseman at [facility] confirms that Berkley Harvey has been started.  Per Kenney Houseman they have been notified that auth can take up to 15 days, but that they have requested it be expedited    Expected Discharge Plan: Skilled Nursing Facility Barriers to Discharge: Continued Medical Work up  Expected Discharge Plan and Services       Living arrangements for the past 2 months: Single Family Home                                       Social Determinants of Health (SDOH) Interventions SDOH Screenings   Food Insecurity: No Food Insecurity (11/21/2022)  Housing: Low Risk  (11/21/2022)  Transportation Needs: No Transportation Needs (11/21/2022)  Utilities: Not At Risk (11/21/2022)  Depression (PHQ2-9): Low Risk  (11/13/2021)  Tobacco Use: Low Risk  (11/25/2022)    Readmission Risk Interventions     No data to display

## 2022-11-27 DIAGNOSIS — I330 Acute and subacute infective endocarditis: Secondary | ICD-10-CM | POA: Diagnosis not present

## 2022-11-27 DIAGNOSIS — M86072 Acute hematogenous osteomyelitis, left ankle and foot: Secondary | ICD-10-CM | POA: Diagnosis not present

## 2022-11-27 LAB — CULTURE, BLOOD (ROUTINE X 2)

## 2022-11-27 LAB — GLUCOSE, CAPILLARY
Glucose-Capillary: 83 mg/dL (ref 70–99)
Glucose-Capillary: 308 mg/dL — ABNORMAL HIGH (ref 70–99)

## 2022-11-27 LAB — CBC WITH DIFFERENTIAL/PLATELET
Abs Immature Granulocytes: 0.09 10*3/uL — ABNORMAL HIGH (ref 0.00–0.07)
Basophils Absolute: 0.1 10*3/uL (ref 0.0–0.1)
Basophils Relative: 0 %
Eosinophils Absolute: 0.3 10*3/uL (ref 0.0–0.5)
Eosinophils Relative: 2 %
HCT: 34.8 % — ABNORMAL LOW (ref 39.0–52.0)
Hemoglobin: 11.1 g/dL — ABNORMAL LOW (ref 13.0–17.0)
Immature Granulocytes: 1 %
Lymphocytes Relative: 10 %
Lymphs Abs: 1.3 10*3/uL (ref 0.7–4.0)
MCH: 28 pg (ref 26.0–34.0)
MCHC: 31.9 g/dL (ref 30.0–36.0)
MCV: 87.7 fL (ref 80.0–100.0)
Monocytes Absolute: 0.7 10*3/uL (ref 0.1–1.0)
Monocytes Relative: 6 %
Neutro Abs: 10.2 10*3/uL — ABNORMAL HIGH (ref 1.7–7.7)
Neutrophils Relative %: 81 %
Platelets: 424 10*3/uL — ABNORMAL HIGH (ref 150–400)
RBC: 3.97 MIL/uL — ABNORMAL LOW (ref 4.22–5.81)
RDW: 15.3 % (ref 11.5–15.5)
WBC: 12.7 10*3/uL — ABNORMAL HIGH (ref 4.0–10.5)
nRBC: 0 % (ref 0.0–0.2)

## 2022-11-27 LAB — BASIC METABOLIC PANEL
Anion gap: 7 (ref 5–15)
BUN: 12 mg/dL (ref 8–23)
CO2: 26 mmol/L (ref 22–32)
Calcium: 7.8 mg/dL — ABNORMAL LOW (ref 8.9–10.3)
Chloride: 104 mmol/L (ref 98–111)
Creatinine, Ser: 0.74 mg/dL (ref 0.61–1.24)
GFR, Estimated: >60 mL/min (ref >60)
Glucose, Bld: 124 mg/dL — ABNORMAL HIGH (ref 70–99)
Potassium: 3.9 mmol/L (ref 3.5–5.1)
Sodium: 137 mmol/L (ref 135–145)

## 2022-11-27 LAB — AEROBIC/ANAEROBIC CULTURE W GRAM STAIN (SURGICAL/DEEP WOUND)

## 2022-11-27 MED ORDER — OXYCODONE-ACETAMINOPHEN 5-325 MG PO TABS: 1.0000 | ORAL_TABLET | Freq: Three times a day (TID) | ORAL | 0 refills | Status: AC | PRN

## 2022-11-27 MED ORDER — CEFAZOLIN IV (FOR PTA / DISCHARGE USE ONLY)
2.0000 g | Freq: Three times a day (TID) | INTRAVENOUS | 0 refills | Status: AC
Start: 2022-11-27 — End: 2023-01-03

## 2022-11-27 MED ORDER — ASCORBIC ACID 250 MG PO TABS: 250.0000 mg | ORAL_TABLET | Freq: Two times a day (BID) | ORAL | 0 refills | Status: DC

## 2022-11-27 NOTE — Progress Notes (Signed)
Peripherally Inserted Central Catheter Placement  The IV Nurse has discussed with the patient and/or persons authorized to consent for the patient, the purpose of this procedure and the potential benefits and risks involved with this procedure.  The benefits include less needle sticks, lab draws from the catheter, and the patient may be discharged home with the catheter. Risks include, but not limited to, infection, bleeding, blood clot (thrombus formation), and puncture of an artery; nerve damage and irregular heartbeat and possibility to perform a PICC exchange if needed/ordered by physician.  Alternatives to this procedure were also discussed.  Bard Power PICC patient education guide, fact sheet on infection prevention and patient information card has been provided to patient /or left at bedside.    PICC Placement Documentation  PICC Single Lumen 11/26/22 Right Basilic 37 cm 1 cm (Active)  Indication for Insertion or Continuance of Line Home intravenous therapies (PICC only) 11/26/22 1900  Exposed Catheter (cm) 1 cm 11/26/22 1900  Site Assessment Clean, Dry, Intact 11/26/22 1900  Line Status Flushed;Saline locked;Blood return noted 11/26/22 1900  Dressing Type Transparent;Securing device 11/26/22 1900  Dressing Status Antimicrobial disc in place 11/26/22 1900  Safety Lock Not Applicable 11/26/22 1900  Line Care Connections checked and tightened 11/26/22 1900  Line Adjustment (NICU/IV Team Only) No 11/26/22 1900  Dressing Intervention New dressing 11/26/22 1900  Dressing Change Due 12/04/22 11/26/22 1900       Alexander Gill  Alexander Gill 11/27/2022, 10:54 AM

## 2022-11-27 NOTE — Progress Notes (Signed)
Physical Therapy Treatment Patient Details Name: Alexander Gill MRN: [id number] DOB: 01/31/1956 Today's Date: 11/27/2022   History of Present Illness Pt admitted to [facility] on 07/04/21 for concern of R foot infection s/p L transmet amputation. 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 Alberteen Spindle. Significant PMH includes: DM, cataracts, HTN, hypothyroidism.    PT Comments    Patient alert, agreeable to PT requesting to use BSC. Session focused on transferring to and from [facility], donning/doffing clothes and sitting balance. minA for transfers for LLE, pt needed constant cues for BUE support and hand placement to improve safety and technique. The patient would benefit from further skilled PT intervention to continue to progress towards goals. Recommendation remains appropriate.     Recommendations for follow up therapy are one component of a multi-disciplinary discharge planning process, led by the attending physician.  Recommendations may be updated based on patient status, additional functional criteria and insurance authorization.  Follow Up Recommendations  Can patient physically be transported by private vehicle: No    Assistance Recommended at Discharge Frequent or constant Supervision/Assistance  Patient can return home with the following A lot of help with bathing/dressing/bathroom;Assistance with cooking/housework;Direct supervision/assist for medications management;Assist for transportation;Help with stairs or ramp for entrance;A lot of help with walking and/or transfers   Equipment Recommendations  Wheelchair (measurements PT);Wheelchair cushion (measurements PT)    Recommendations for Other Services       Precautions / Restrictions Precautions Precautions: Fall Restrictions Weight Bearing Restrictions: Yes RLE Weight Bearing: Non weight bearing LLE Weight Bearing: Non weight bearing Other Position/Activity Restrictions: per pt, MD stated he could use his R foot in cam boot for  "one step"     Mobility  Bed Mobility Overal bed mobility: Needs Assistance Bed Mobility: Supine to Sit     Supine to sit: Supervision          Transfers Overall transfer level: Needs assistance Equipment used: None Transfers: Bed to chair/wheelchair/BSC            Lateral/Scoot Transfers: Min assist General transfer comment: minA for LLE assist, needed extended time for some rest breaks    Ambulation/Gait                   Stairs             Wheelchair Mobility    Modified Rankin (Stroke Patients Only)       Balance Overall balance assessment: Needs assistance Sitting-balance support: Feet supported Sitting balance-Leahy Scale: Fair Sitting balance - Comments: occasional posterior lean/LOB when transferring       Standing balance comment: unable, NWB bilaterally                            Cognition Arousal/Alertness: Awake/alert Behavior During Therapy: WFL for tasks assessed/performed Overall Cognitive Status: Within Functional Limits for tasks assessed                                          Exercises      General Comments        Pertinent Vitals/Pain Pain Assessment Pain Assessment: Faces Faces Pain Scale: Hurts a little bit Pain Location: L foot with mobility Pain Descriptors / Indicators: Grimacing Pain Intervention(s): Limited activity within patient's tolerance, Monitored during session, Repositioned    Home Living  Prior Function            PT Goals (current goals can now be found in the care plan section) Progress towards PT goals: Progressing toward goals    Frequency    Min 4X/week      PT Plan Current plan remains appropriate    Co-evaluation              AM-PAC PT "6 Clicks" Mobility   Outcome Measure  Help needed turning from your back to your side while in a flat bed without using bedrails?: A Little Help needed moving  from lying on your back to sitting on the side of a flat bed without using bedrails?: A Little Help needed moving to and from a bed to a chair (including a wheelchair)?: A Little Help needed standing up from a chair using your arms (e.g., wheelchair or bedside chair)?: Total Help needed to walk in hospital room?: Total Help needed climbing 3-5 steps with a railing? : Total 6 Click Score: 12    End of Session   Activity Tolerance: Patient tolerated treatment well Patient left: Other (comment) (sitting EOB with OT) Nurse Communication: Mobility status PT Visit Diagnosis: Other abnormalities of gait and mobility (R26.89);Muscle weakness (generalized) (M62.81);Pain Pain - Right/Left: Right Pain - part of body: Ankle and joints of foot     Time: 2956-2130 PT Time Calculation (min) (ACUTE ONLY): 26 min  Charges:  $Therapeutic Activity: 23-37 mins                     Olga Coaster PT, DPT 12:58 PM,11/27/22

## 2022-11-27 NOTE — TOC Transition Note (Signed)
Transition of Care [facility]) - CM/SW Discharge Note   Patient Details  Name: Malikk Simrell MRN: [id number] Date of Birth: 1956-03-06  [facility] CM/SW Contact:  Margarito Liner, LCSW Phone Number: 11/27/2022, 2:37 PM   Clinical Narrative: Patient has orders to discharge to [facility] today. RN will call report to [phone] (Room 35A). EMS transport set up for 3:30. No further concerns. CSW signing off.    Final next level of care: Skilled Nursing Facility Barriers to Discharge: Barriers Resolved   Patient Goals and CMS Choice      Discharge Placement PASRR number recieved: 11/24/22 PASRR number recieved: 11/24/22            Patient chooses bed at: [facility] Patient to be transferred to facility by: EMS   Patient and family notified of of transfer: 11/27/22  Discharge Plan and Services Additional resources added to the After Visit Summary for                                       Social Determinants of Health (SDOH) Interventions SDOH Screenings   Food Insecurity: No Food Insecurity (11/21/2022)  Housing: Low Risk  (11/21/2022)  Transportation Needs: No Transportation Needs (11/21/2022)  Utilities: Not At Risk (11/21/2022)  Depression (PHQ2-9): Low Risk  (11/13/2021)  Tobacco Use: Low Risk  (11/25/2022)     Readmission Risk Interventions     No data to display

## 2022-11-27 NOTE — Discharge Summary (Signed)
Physician Discharge Summary   Patient: Alexander Gill MRN: [id number] DOB: [birth year]  Admit date:     11/20/2022  Discharge date: 11/27/22  Discharge Physician: Loyce Dys   PCP: [facility]   Recommendations at discharge:  According to podiatrists patient instructed to keep his left foot clean, dry, and does not need to change or remove the bandage at this point. Discussed that he needs to continue nonweightbearing on both lower extremities.   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  05/03:  WBC 19.4, GFR> 60, potassium 3.4, temperature 101.5, blood pressure 120/62, heart rate 93, RR 20, oxygen saturation 99% on room air.  Patient was admitted to MedSurg for osteomyelitis, cellulitis w/ abscess L foot.  Dr. Alberteen Spindle from podiatry was consulted. L foot abscess cultured.   WBC trending down. (+)BCx gram pos cocci - ID panel (+)staph --> abx narrowed to cefazolin  05/05: Underwent Transmetatarsal amputation left foot w/ Dr Alberteen Spindle. Repeat BCx drawn.  ID was consulted for antibiotic guidance.  PT saw the patient and recommended SNF/STR. Transthoracic echo was concerning for possible mitral valve vegetation and so patient underwent TEE which showed moderate burden of vegetation on the mitral valve with mild mitral  regurgitation. Infectious disease has recommended patient to be on IV cefazolin for 6 weeks. Patient will follow-up with infectious disease and podiatry as an outpatient   Below are the details of IV antibiotics: Cefazolin 2 grams iV every 8 hours Duration: 6 weeks End Date: 01/02/23  Consultants: Urology, infectious disease, podiatry Procedures performed: As mentioned above Disposition: Skilled nursing facility Diet recommendation:  Discharge Diet Orders (From admission, onward)     Start     Ordered   11/27/22 0000  Diet - low sodium heart healthy        11/27/22 1231           Cardiac diet DISCHARGE MEDICATION: Allergies as of 11/27/2022   No Known Allergies      Medication List     STOP taking these medications    atenolol 25 MG tablet Commonly known as: TENORMIN       TAKE these medications    ascorbic acid 250 MG tablet Commonly known as: VITAMIN C Take 1 tablet (250 mg total) by mouth 2 (two) times daily.   aspirin EC 81 MG tablet Take 81 mg by mouth daily. Swallow whole.   BASAGLAR KWIKPEN [place] Inject 36 Units into the skin daily.   ceFAZolin  IVPB Commonly known as: ANCEF Inject 2 g into the vein every 8 (eight) hours. Indication:  MSSA bacteremia/mitral valve endocarditis Last Day of Therapy:  01/02/23 Labs - Once weekly:  CBC/D, CMP, ESR and CRP Please pull PIC at completion of IV antibiotics  Fax weekly lab results  promptly to [phone] Method of administration: IV Push Method of administration may be changed at the discretion of facility/pharmacy Clinic Follow Up Appt: with Dr.Ravishankar 12/24/22 at  10.30AM   cholecalciferol 25 MCG (1000 UNIT) tablet Commonly known as: VITAMIN D3 Take 1,000 Units by mouth daily.   levothyroxine 25 MCG tablet Commonly known as: SYNTHROID Take 25 mcg by mouth daily.   losartan-hydrochlorothiazide 100-25 MG tablet Commonly known as: HYZAAR Take 1 tablet by mouth daily.   oxyCODONE-acetaminophen  5-325 MG tablet Commonly known as: PERCOCET/ROXICET Take 1 tablet by mouth every 8 (eight) hours as needed for up to 3 days for severe pain.   rosuvastatin 20 MG tablet Commonly known as: CRESTOR Take 20 mg by mouth daily.   Rybelsus 3 MG Tabs Generic drug: Semaglutide Take 3 mg by mouth daily.   Xigduo XR 11-998 MG Tb24 Generic drug: Dapagliflozin Pro-metFORMIN ER Take 1 tablet by mouth daily at 2 am.               Home Infusion Instuctions  (From admission, onward)           Start     Ordered   11/27/22 0000  Home infusion instructions       Question:  Instructions  Answer:  Flushing of vascular access device: 0.9% NaCl pre/post medication administration and prn patency; Heparin 100 u/ml, 5ml for implanted ports and Heparin 10u/ml, 5ml for all other central venous catheters.   11/27/22 1230            Contact information for after-discharge care     Destination     [facility] Preferred SNF .   Service: Skilled Nursing Contact information: [address] [place] [address] [phone]                    Discharge Exam: Ceasar Mons Weights   11/26/22 0500 11/26/22 1143 11/27/22 0411  Weight: 88.2 kg 88.2 kg 87.3 kg   Physical Exam Constitutional:      General: He is not in acute distress. Cardiovascular:     Rate and Rhythm: Normal rate and regular rhythm.  Pulmonary:     Effort: Pulmonary effort is normal.     Breath sounds: Normal breath sounds.  Neurological:     General: No focal deficit present.     Mental Status: He is alert.  Psychiatric:        Mood and Affect: Mood normal.        Behavior: Behavior normal.  Extremities: Left foot dressing clean and dry  Condition at discharge: good Discharge time spent: greater than 30 minutes.  Signed: Loyce Dys, MD [facility] 11/27/2022

## 2022-11-27 NOTE — Progress Notes (Signed)
Occupational Therapy Treatment Patient Details Name: Alexander Gill MRN: [id number] DOB: 1955-12-25 Today's Date: 11/27/2022   History of present illness Pt admitted to [facility] on 07/04/21 for concern of R foot infection s/p L transmet amputation. 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 Alberteen Spindle. Significant PMH includes: DM, cataracts, HTN, hypothyroidism.   OT comments  Pt seen for OT tx. Pt with PT finishing session. Pt had BM, educated in significant lateral lean (essentially R side lying support across bari drop arm BSC and bed) and completing pericare using L hand with setup for wipes. Pt then completed lateral scoots to the R back into bed with brief rest breaks throughout movement 2/2 exertion. Set up for grooming tasks. Pt endorses fatigue but motivated throughout and demonstrating progress towards goals. Continues to benefit from skilled OT.    Recommendations for follow up therapy are one component of a multi-disciplinary discharge planning process, led by the attending physician.  Recommendations may be updated based on patient status, additional functional criteria and insurance authorization.    Assistance Recommended at Discharge Frequent or constant Supervision/Assistance  Patient can return home with the following  A lot of help with bathing/dressing/bathroom;A lot of help with walking and/or transfers;Assistance with cooking/housework;Assist for transportation;Help with stairs or ramp for entrance   Equipment Recommendations  Other (comment) (drop arm BSC)    Recommendations for Other Services      Precautions / Restrictions Precautions Precautions: Fall Restrictions Weight Bearing Restrictions: Yes RLE Weight Bearing: Non weight bearing LLE Weight Bearing: Non weight bearing Other Position/Activity Restrictions: per pt, MD stated he could use his R foot in cam boot for "one step"       Mobility Bed Mobility Overal bed mobility: Needs Assistance Bed Mobility: Sit  to Supine       Sit to supine: Supervision        Transfers Overall transfer level: Needs assistance Equipment used: None Transfers: Bed to chair/wheelchair/BSC            Lateral/Scoot Transfers: Min guard       Balance Overall balance assessment: Needs assistance Sitting-balance support: Feet supported Sitting balance-Leahy Scale: Fair                                     ADL either performed or assessed with clinical judgement   ADL Overall ADL's : Needs assistance/impaired     Grooming: Sitting;Set up;Oral care;Wash/dry face;Wash/dry Lawyer: Min guard;Requires drop arm Toilet Transfer Details (indicate cue type and reason): on bari BSC with R arm dropped, CGA for R lateral scoot back to bed afterwards, maintaining NWBing Toileting- Clothing Manipulation and Hygiene: Set up;Supervision/safety;Sitting/lateral lean Toileting - Clothing Manipulation Details (indicate cue type and reason): educated in lateral lean method and pt able to come down on R elbow on bed and use L hand to complete pericare while essentially in R sidelying, set up for several wipes to ensure thoroughness after BM            Extremity/Trunk Assessment              Vision       Perception     Praxis      Cognition Arousal/Alertness: Awake/alert Behavior During Therapy: WFL for tasks assessed/performed Overall Cognitive Status: Within Functional Limits for tasks  assessed                                 General Comments: motivated throughout        Exercises      Shoulder Instructions       General Comments      Pertinent Vitals/ Pain          Home Living                                          Prior Functioning/Environment              Frequency  Min 3X/week        Progress Toward Goals  OT Goals(current goals can now be found in the care plan section)   Progress towards OT goals: Progressing toward goals  Acute Rehab OT Goals Patient Stated Goal: return to PLOF OT Goal Formulation: With patient Time For Goal Achievement: 12/07/22 Potential to Achieve Goals: Good  Plan Discharge plan remains appropriate;Frequency remains appropriate    Co-evaluation                 AM-PAC OT "6 Clicks" Daily Activity     Outcome Measure   Help from another person eating meals?: None Help from another person taking care of personal grooming?: A Little Help from another person toileting, which includes using toliet, bedpan, or urinal?: A Little Help from another person bathing (including washing, rinsing, drying)?: A Lot Help from another person to put on and taking off regular upper body clothing?: A Little Help from another person to put on and taking off regular lower body clothing?: A Lot 6 Click Score: 17    End of Session    OT Visit Diagnosis: Other abnormalities of gait and mobility (R26.89);Muscle weakness (generalized) (M62.81);Pain Pain - Right/Left: Right Pain - part of body: Ankle and joints of foot   Activity Tolerance Patient tolerated treatment well   Patient Left in bed;with call bell/phone within reach;with bed alarm set   Nurse Communication          Time: 9811-9147 OT Time Calculation (min): 10 min  Charges: OT General Charges $OT Visit: 1 Visit OT Treatments $Self Care/Home Management : 8-22 mins  Arman Filter., MPH, MS, OTR/L ascom [phone] 11/27/22, 1:26 PM

## 2022-11-27 NOTE — Progress Notes (Signed)
Report was called to [place] and [employer] received report. Questions were encourage and answered. Call back number was given in case they had any questions.

## 2022-11-28 LAB — SUSCEPTIBILITY, AER + ANAEROB: Source of Sample: 8680

## 2022-11-30 ENCOUNTER — Telehealth: Payer: Self-pay

## 2022-11-30 NOTE — Telephone Encounter (Signed)
Patient called office stating that [facility] is not able to administer Iv antiobiotics. Spoke with RN at SNF who states patient is getting antibiotics as written in OPAT. Will have labs done tomorrow and faxed to office.  W:[phone] Juanita Laster, RMA

## 2022-12-01 LAB — LAB REPORT - SCANNED: eGFR: 90

## 2022-12-01 LAB — SUSCEPTIBILITY RESULT

## 2022-12-01 LAB — SUSCEPTIBILITY, AER + ANAEROB

## 2022-12-03 ENCOUNTER — Encounter: Payer: Self-pay | Admitting: Cardiovascular Disease

## 2022-12-24 ENCOUNTER — Ambulatory Visit: Payer: BC Managed Care – PPO | Attending: Infectious Diseases | Admitting: Infectious Diseases

## 2022-12-24 VITALS — BP 151/88 | HR 88 | Temp 98.3°F | Ht 73.0 in | Wt 185.0 lb

## 2022-12-24 DIAGNOSIS — Z794 Long term (current) use of insulin: Secondary | ICD-10-CM | POA: Diagnosis not present

## 2022-12-24 DIAGNOSIS — E039 Hypothyroidism, unspecified: Secondary | ICD-10-CM | POA: Diagnosis not present

## 2022-12-24 DIAGNOSIS — B9561 Methicillin susceptible Staphylococcus aureus infection as the cause of diseases classified elsewhere: Secondary | ICD-10-CM | POA: Diagnosis not present

## 2022-12-24 DIAGNOSIS — E785 Hyperlipidemia, unspecified: Secondary | ICD-10-CM | POA: Insufficient documentation

## 2022-12-24 DIAGNOSIS — E119 Type 2 diabetes mellitus without complications: Secondary | ICD-10-CM

## 2022-12-24 DIAGNOSIS — R7881 Bacteremia: Secondary | ICD-10-CM | POA: Insufficient documentation

## 2022-12-24 DIAGNOSIS — Z79890 Hormone replacement therapy (postmenopausal): Secondary | ICD-10-CM | POA: Diagnosis not present

## 2022-12-24 DIAGNOSIS — I059 Rheumatic mitral valve disease, unspecified: Secondary | ICD-10-CM

## 2022-12-24 DIAGNOSIS — B998 Other infectious disease: Secondary | ICD-10-CM | POA: Diagnosis present

## 2022-12-24 NOTE — Patient Instructions (Addendum)
You are here for follow up of infective endocarditis with MSSA and mitral valve involvement- you will complete 6 weeks of Iv cefazolin on 01/02/23 and the PICC can be removed on the 16th Will refer you to [facility] cardiologist for follow up

## 2022-12-24 NOTE — Progress Notes (Signed)
NAME: Alexander Gill  DOB: [birth year]  MRN: [id number]  Date/Time: 12/24/2022 11:03 AM   Subjective:   ?follow up after recent hospitalization 11/20/22-11/27/22 Alexander Gill is a 67 y.o. male with a history of DM, HTN, diabetic foot infection was in [facility] in May and underwent TMA of the left foot on 11/22/22 Had MSSA bacteremia and TEE showed mitral valve endocarditis. He was discharged to SNF on cefazolin for 6 weeks until 01/02/23 He has gone home and he does the Iv himself HE has charcot rt foot and uses a knee scooter He saw Dr.Cline and the left TMA site has healed well He is doing well Past Medical History:  Diagnosis Date   Cataract    bilateral eyes   Diabetes mellitus without complication (HCC)    Heart abnormality    Enlarged left ventricle   Hypertension     Past Surgical History:  Procedure Laterality Date   AMPUTATION Right 10/22/2021   Procedure: AMPUTATION RAY-PARTIAL 3RD RAY;  Surgeon: Rosetta Posner, DPM;  Location: [facility];  Service: Podiatry;  Laterality: Right;   AMPUTATION Left 11/22/2022   Procedure: Transmetatarsal Amuptation Left Foot;  Surgeon: Linus Galas, DPM;  Location: [facility];  Service: Podiatry;  Laterality: Left;   AMPUTATION TOE Right 07/05/2021   Procedure: AMPUTATION RIGHT 4TH TOE;  Surgeon: Linus Galas, DPM;  Location: [facility];  Service: Podiatry;  Laterality: Right;   HERNIA REPAIR     INCISION AND DRAINAGE OF WOUND Left 10/22/2021   Procedure: IRRIGATION AND DEBRIDEMENT WOUND;  Surgeon: Rosetta Posner, DPM;  Location: [facility];  Service: Podiatry;  Laterality: Left;   TEE WITHOUT CARDIOVERSION N/A 11/26/2022   Procedure: TRANSESOPHAGEAL ECHOCARDIOGRAM;  Surgeon: Antonieta Iba, MD;  Location: [facility];  Service: Cardiovascular;  Laterality: N/A;   TOE ARTHROPLASTY Left 10/22/2021   Procedure: TOE ARTHROPLASTY;  Surgeon: Rosetta Posner, DPM;  Location: [facility];  Service: Podiatry;  Laterality: Left;    Social History   Socioeconomic History   Marital  status: Married    Spouse name: Not on file   Number of children: Not on file   Years of education: Not on file   Highest education level: Not on file  Occupational History   Not on file  Tobacco Use   Smoking status: Never   Smokeless tobacco: Never  Vaping Use   Vaping Use: Never used  Substance and Sexual Activity   Alcohol use: Not Currently   Drug use: Not Currently   Sexual activity: Not Currently  Other Topics Concern   Not on file  Social History Narrative   Not on file   Social Determinants of Health   Financial Resource Strain: Not on file  Food Insecurity: No Food Insecurity (11/21/2022)   Hunger Vital Sign    Worried About Running Out of Food in the Last Year: Never true    Ran Out of Food in the Last Year: Never true  Transportation Needs: No Transportation Needs (11/21/2022)   PRAPARE - Administrator, Civil Service (Medical): No    Lack of Transportation (Non-Medical): No  Physical Activity: Not on file  Stress: Not on file  Social Connections: Not on file  Intimate Partner Violence: Not At Risk (11/21/2022)   Humiliation, Afraid, Rape, and Kick questionnaire    Fear of Current or Ex-Partner: No    Emotionally Abused: No    Physically Abused: No    Sexually Abused: No    Family History  Problem Relation Age of  Onset   Diabetes Mother    Diabetes Father    Parkinson's disease Brother    No Known Allergies I? Current Outpatient Medications  Medication Sig Dispense Refill   ascorbic acid (VITAMIN C) 250 MG tablet Take 1 tablet (250 mg total) by mouth 2 (two) times daily. 30 tablet 0   aspirin EC 81 MG tablet Take 81 mg by mouth daily. Swallow whole.     ceFAZolin (ANCEF) IVPB Inject 2 g into the vein every 8 (eight) hours. Indication:  MSSA bacteremia/mitral valve endocarditis Last Day of Therapy:  01/02/23 Labs - Once weekly:  CBC/D, CMP, ESR and CRP Please pull PIC at completion of IV antibiotics  Fax weekly lab results  promptly to [phone]  [phone] Method of administration: IV Push Method of administration may be changed at the discretion of facility/pharmacy Clinic Follow Up Appt: with Dr.Tasmia Blumer 12/24/22 at 10.30AM 111 Units 0   cholecalciferol (VITAMIN D3) 25 MCG (1000 UNIT) tablet Take 1,000 Units by mouth daily.     Dapagliflozin-metFORMIN HCl ER (XIGDUO XR) 11-998 MG TB24 Take 1 tablet by mouth daily at 2 am.     levothyroxine (SYNTHROID) 25 MCG tablet Take 25 mcg by mouth daily.     losartan-hydrochlorothiazide (HYZAAR) 100-25 MG tablet Take 1 tablet by mouth daily.     rosuvastatin (CRESTOR) 20 MG tablet Take 20 mg by mouth daily.     Insulin Glargine (BASAGLAR KWIKPEN [place]) Inject 36 Units into the skin daily.     No current facility-administered medications for this visit.     Abtx:  Anti-infectives (From admission, onward)    None       REVIEW OF SYSTEMS:  Const: negative fever, negative chills, negative weight loss Eyes: negative diplopia or visual changes, negative eye pain ENT: negative coryza, negative sore throat Resp: negative cough, hemoptysis, dyspnea Cards: negative for chest pain, palpitations, lower extremity edema GU: negative for frequency, dysuria and hematuria GI: Negative for abdominal pain, diarrhea, bleeding, constipation Skin: negative for rash and pruritus Heme: negative for easy bruising and gum/nose bleeding MS: negative for myalgias, arthralgias, back pain and muscle weakness Neurolo:negative for headaches, dizziness, vertigo, memory problems  Psych: negative for feelings of anxiety, depression  Endocrine: negative for thyroid, diabetes Allergy/Immunology- negative for any medication or food allergies ? Objective:  VITALS:  BP (!) 151/88   Pulse 88   Temp 98.3 F (36.8 C) (Oral)   Ht 6\' 1"  (1.854 m)   Wt 185 lb (83.9 kg)   SpO2 97%   BMI 24.41 kg/m  LDA Rt PICC PHYSICAL EXAM:  General: Alert, cooperative, no distress, appears stated age.  Lungs: Clear to  auscultation bilaterally. No Wheezing or Rhonchi. No rales. Heart: s1s2- 4/6 systolic murmur Abdomen: did not examine Extremities: left TMA site healed well  Skin: No rashes or lesions. Or bruising Lymph: Cervical, supraclavicular normal. Neurologic: Grossly non-focal Pertinent Labs ESR is 15 ( was 100) CRP 3   ? Impression/Recommendation ? MSSA bacteremia with Mitral valve endocarditis with mitral regurgitation Will complete 6 weeks of Iv cefazoin on 01/02/23 and PICC will be removed after that Will refer to cardiology for follow up of the MR/endocarditis  Left TMA- for Diabetic foot infection- healed well DM on insulin  HLD on rosuvastatin  Hypothyroidism on synthroid ? ? ___________________________________________________ Discussed with patient,in detail  Note:  This document was prepared using Dragon voice recognition software and may include unintentional dictation errors.

## 2023-01-04 ENCOUNTER — Telehealth: Payer: Self-pay

## 2023-01-04 NOTE — Telephone Encounter (Signed)
[place], RN with [facility] called office for pull picc orders. Per note on 6/6 picc can be pulled 6/16. Relayed verbal order to remove picc. Juanita Laster, RMA

## 2023-03-15 ENCOUNTER — Ambulatory Visit: Payer: BC Managed Care – PPO | Admitting: Cardiovascular Disease

## 2023-05-21 ENCOUNTER — Ambulatory Visit: Payer: BC Managed Care – PPO | Attending: Cardiovascular Disease | Admitting: Cardiovascular Disease

## 2023-05-21 ENCOUNTER — Encounter: Payer: Self-pay | Admitting: Cardiovascular Disease

## 2023-05-21 VITALS — BP 130/80 | HR 65 | Ht 73.0 in | Wt 191.0 lb

## 2023-05-21 DIAGNOSIS — E782 Mixed hyperlipidemia: Secondary | ICD-10-CM | POA: Diagnosis not present

## 2023-05-21 DIAGNOSIS — I330 Acute and subacute infective endocarditis: Secondary | ICD-10-CM | POA: Diagnosis not present

## 2023-05-21 DIAGNOSIS — E11621 Type 2 diabetes mellitus with foot ulcer: Secondary | ICD-10-CM

## 2023-05-21 DIAGNOSIS — I10 Essential (primary) hypertension: Secondary | ICD-10-CM | POA: Diagnosis not present

## 2023-05-21 DIAGNOSIS — L97509 Non-pressure chronic ulcer of other part of unspecified foot with unspecified severity: Secondary | ICD-10-CM

## 2023-05-21 NOTE — Patient Instructions (Addendum)
Medication Instructions:  No changes  If you need a refill on your cardiac medications before your next appointment, please call your pharmacy.   Lab work: No new labs needed  Testing/Procedures: Your physician has requested that you have an echocardiogram. Echocardiography is a painless test that uses sound waves to create images of your heart. It provides your doctor with information about the size and shape of your heart and how well your heart's chambers and valves are working.   You may receive an ultrasound enhancing agent through an IV if needed to better visualize your heart during the echo. This procedure takes approximately one hour.  There are no restrictions for this procedure.  This will take place at [address] ([facility] [address], [place] [address]   Follow-Up: At [facility], you and your health needs are our priority.  As part of our continuing mission to provide you with exceptional heart care, we have created designated Provider Care Teams.  These Care Teams include your primary Cardiologist (physician) and Advanced Practice Providers (APPs -  Physician Assistants and Nurse Practitioners) who all work together to provide you with the care you need, when you need it.  You will need a follow up appointment in 6 months  Providers on your designated Care Team:   Christopher Berge, NP Ryan Dunn, PA-C Cadence Furth, PA-C  COVID-19 Vaccine Information can be found at: [email or web address] For questions related to vaccine distribution or appointments, please email [email or web address] or call [phone].   

## 2023-05-21 NOTE — Progress Notes (Signed)
Cardiology Office Note  Date:  05/21/2023   ID:  Alexander Gill, DOB January 30, 1956, MRN [id number]  PCP:  [facility]   Chief Complaint  Patient presents with   New Patient (Initial Visit)    Ref by Dr. Rivka Safer for endocarditis of the mitral valve. Medications reviewed by the patient verbally.     HPI:  Mr. Armend Hochstatter is a 67 year old gentleman with past medical history of Diabetes, TMA left foot May 2024 charcot rt foot MSSA bacteremia Mitral valve endocarditis noted on TEE May 2024 completed 6 weeks of Iv cefazoin on 01/02/23  Who presents by referral from Dr. Joylene Draft for endocarditis of mitral valve  Lost to cardiology follow-up since May 2024 Previously canceled cardiology appointment March 15, 2023 Followed by [facility] primary care No recent blood cultures available  Looking for endocrine in the area  Denies diaphoresis, no chills fever, no general malaise Denies shortness of breath or chest pain on exertion No lower extremity edema, no PND orthopnea, no weight gain, no abdominal distention from fluid retention  Overall reports he is feeling relatively well Has a brace on right ankle for Charcot right foot, amputation on left foot  Lab work reviewed  A1c 8.0 Total cholesterol 87 LDL 42 No recent CBC or inflammatory markers  EKG personally reviewed by myself on todays visit EKG Interpretation Date/Time:  Friday May 21 2023 09:02:37 EDT Ventricular Rate:  65 PR Interval:  156 QRS Duration:  130 QT Interval:  402 QTC Calculation: 418 R Axis:   -31  Text Interpretation: Normal sinus rhythm Left axis deviation Left ventricular hypertrophy with QRS widening ( R in aVL , Cornell product ) When compared with ECG of 04-Jul-2021 14:32, No significant change was found Confirmed by Julien Nordmann [phone]) on 05/21/2023 9:11:18 AM    PMH:   has a past medical history of Cataract, Diabetes mellitus without complication (HCC), Heart abnormality, and  Hypertension.  PSH:    Past Surgical History:  Procedure Laterality Date   AMPUTATION Right 10/22/2021   Procedure: AMPUTATION RAY-PARTIAL 3RD RAY;  Surgeon: Rosetta Posner, DPM;  Location: [facility];  Service: Podiatry;  Laterality: Right;   AMPUTATION Left 11/22/2022   Procedure: Transmetatarsal Amuptation Left Foot;  Surgeon: Linus Galas, DPM;  Location: [facility];  Service: Podiatry;  Laterality: Left;   AMPUTATION TOE Right 07/05/2021   Procedure: AMPUTATION RIGHT 4TH TOE;  Surgeon: Linus Galas, DPM;  Location: [facility];  Service: Podiatry;  Laterality: Right;   HERNIA REPAIR     INCISION AND DRAINAGE OF WOUND Left 10/22/2021   Procedure: IRRIGATION AND DEBRIDEMENT WOUND;  Surgeon: Rosetta Posner, DPM;  Location: [facility];  Service: Podiatry;  Laterality: Left;   TEE WITHOUT CARDIOVERSION N/A 11/26/2022   Procedure: TRANSESOPHAGEAL ECHOCARDIOGRAM;  Surgeon: Antonieta Iba, MD;  Location: [facility];  Service: Cardiovascular;  Laterality: N/A;   TOE ARTHROPLASTY Left 10/22/2021   Procedure: TOE ARTHROPLASTY;  Surgeon: Rosetta Posner, DPM;  Location: [facility];  Service: Podiatry;  Laterality: Left;    Current Outpatient Medications  Medication Sig Dispense Refill   ascorbic acid (VITAMIN C) 250 MG tablet Take 1 tablet (250 mg total) by mouth 2 (two) times daily. 30 tablet 0   aspirin EC 81 MG tablet Take 81 mg by mouth daily. Swallow whole.     cholecalciferol (VITAMIN D3) 25 MCG (1000 UNIT) tablet Take 1,000 Units by mouth daily.     Dapagliflozin-metFORMIN HCl ER (XIGDUO XR) 11-998 MG TB24 Take 1 tablet by  mouth daily at 2 am.     insulin glargine (LANTUS) 100 UNIT/ML injection Inject 36 Units into the skin daily.     levothyroxine (SYNTHROID) 25 MCG tablet Take 25 mcg by mouth daily.     losartan-hydrochlorothiazide (HYZAAR) 100-25 MG tablet Take 1 tablet by mouth daily.     rosuvastatin (CRESTOR) 20 MG tablet Take 20 mg by mouth daily.     RYBELSUS 3 MG TABS Take 1 tablet by mouth daily.      Insulin Glargine (BASAGLAR KWIKPEN [place]) Inject 36 Units into the skin daily. (Patient not taking: Reported on 05/21/2023)     No current facility-administered medications for this visit.    Allergies:   Patient has no known allergies.   Social History:  The patient  reports that he has never smoked. He has never used smokeless tobacco. He reports that he does not currently use alcohol. He reports that he does not currently use drugs.   Family History:   family history includes Diabetes in his father and mother; Parkinson's disease in his brother.    Review of Systems: Review of Systems  Constitutional: Negative.   HENT: Negative.    Respiratory: Negative.    Cardiovascular: Negative.   Gastrointestinal: Negative.   Musculoskeletal: Negative.   Neurological: Negative.   Psychiatric/Behavioral: Negative.    All other systems reviewed and are negative.    PHYSICAL EXAM: VS:  BP 130/80 (BP Location: Right Arm, Patient Position: Sitting, Cuff Size: Normal)   Pulse 65   Ht 6\' 1"  (1.854 m)   Wt 191 lb (86.6 kg)   SpO2 98%   BMI 25.20 kg/m  , BMI Body mass index is 25.2 kg/m. GEN: Well nourished, well developed, in no acute distress HEENT: normal Neck: no JVD, carotid bruits, or masses Cardiac: RRR; 3/6 systolic ejection murmur left sternal border radiating into left axilla No rubs, or gallops,no edema  Respiratory:  clear to auscultation bilaterally, normal work of breathing GI: soft, nontender, nondistended, + BS MS: no deformity or atrophy Skin: warm and dry, no rash Neuro:  Strength and sensation are intact Psych: euthymic mood, full affect  Recent Labs: 11/20/2022: ALT 14; Magnesium 2.0 11/27/2022: BUN 12; Creatinine, Ser 0.74; Hemoglobin 11.1; Platelets 424; Potassium 3.9; Sodium 137    Lipid Panel No results found for: "CHOL", "HDL", "LDLCALC", "TRIG"    Wt Readings from Last 3 Encounters:  05/21/23 191 lb (86.6 kg)  12/24/22 185 lb (83.9 kg)  11/27/22 192 lb 7.4  oz (87.3 kg)      ASSESSMENT AND PLAN:  Problem List Items Addressed This Visit       Cardiology Problems   Acute bacterial endocarditis - Primary   Relevant Orders   EKG 12-Lead (Completed)   Hypertension   Relevant Orders   EKG 12-Lead (Completed)   HLD (hyperlipidemia)     Other   Type 2 diabetes mellitus with diabetic foot ulcer (HCC)   Relevant Medications   RYBELSUS 3 MG TABS   insulin glargine (LANTUS) 100 UNIT/ML injection   Other Relevant Orders   EKG 12-Lead (Completed)   Mitral valve endocarditis/regurgitation Lost to follow-up from May 2024 Reports he completed 6 weeks IV antibiotics with PICC line Has felt well since that time denies any symptoms concerning for fluid retention Significant murmur on exam concerning for mitral valve regurgitation, again reports he is asymptomatic Previously noted to be mild to moderate on echo May 2024, sounds more impressive on today's visit Surface echo ordered, if  vegetation noted would order blood cultures For significant regurgitation will need referral to valvular heart clinic  Essential hypertension He reports atenolol previously stopped presumably for bradycardia Blood pressure is well controlled on today's visit. No changes made to the medications.  Hyperlipidemia Cholesterol is at goal on the current lipid regimen. No changes to the medications were made.  Diabetes type 2 with complications History of amputation left foot A1c 8.0, managed by primary care He is looking for local endocrinology, watches his diet, on high-dose insulin    Signed, Dossie Arbour, M.D., Ph.D. [facility] [place], [place] [phone]

## 2023-05-26 ENCOUNTER — Other Ambulatory Visit: Payer: Self-pay | Admitting: Cardiovascular Disease

## 2023-05-26 DIAGNOSIS — E782 Mixed hyperlipidemia: Secondary | ICD-10-CM

## 2023-05-26 DIAGNOSIS — I059 Rheumatic mitral valve disease, unspecified: Secondary | ICD-10-CM

## 2023-05-26 DIAGNOSIS — I330 Acute and subacute infective endocarditis: Secondary | ICD-10-CM

## 2023-05-26 DIAGNOSIS — I10 Essential (primary) hypertension: Secondary | ICD-10-CM

## 2023-05-26 DIAGNOSIS — E11621 Type 2 diabetes mellitus with foot ulcer: Secondary | ICD-10-CM

## 2023-06-04 ENCOUNTER — Ambulatory Visit: Payer: BC Managed Care – PPO | Attending: Cardiovascular Disease

## 2023-06-04 DIAGNOSIS — I059 Rheumatic mitral valve disease, unspecified: Secondary | ICD-10-CM

## 2023-06-04 DIAGNOSIS — I517 Cardiomegaly: Secondary | ICD-10-CM | POA: Diagnosis not present

## 2023-06-04 DIAGNOSIS — I340 Nonrheumatic mitral (valve) insufficiency: Secondary | ICD-10-CM

## 2023-06-04 DIAGNOSIS — I080 Rheumatic disorders of both mitral and aortic valves: Secondary | ICD-10-CM | POA: Diagnosis not present

## 2023-06-04 DIAGNOSIS — I330 Acute and subacute infective endocarditis: Secondary | ICD-10-CM

## 2023-06-04 LAB — ECHOCARDIOGRAM COMPLETE
AR max vel: 4.05 cm2
AV Area VTI: 4.1 cm2
AV Area mean vel: 3.78 cm2
AV Mean grad: 3 mm[Hg]
AV Peak grad: 6.2 mm[Hg]
Ao pk vel: 1.24 m/s
Area-P 1/2: 3.48 cm2
Calc EF: 59.7 %
MV VTI: 3.15 cm2
S' Lateral: 3.3 cm
Single Plane A2C EF: 56.5 %
Single Plane A4C EF: 63.4 %

## 2023-06-14 ENCOUNTER — Telehealth: Payer: Self-pay | Admitting: Cardiovascular Disease

## 2023-06-14 DIAGNOSIS — Z79899 Other long term (current) drug therapy: Secondary | ICD-10-CM

## 2023-06-14 NOTE — Telephone Encounter (Signed)
Called patient and left message for call back.

## 2023-06-14 NOTE — Telephone Encounter (Signed)
Patient came by office States that he would like to try for Dec 11th Please call to discuss

## 2023-06-14 NOTE — Telephone Encounter (Signed)
Called and spoke with patient. Patient asking what days are available for TEE. Patient informed off some available dates and times. Patient states that he is going to find transportation and that he will call back.

## 2023-06-14 NOTE — Telephone Encounter (Signed)
Patient states he is returning call to schedule TEE.  He states he is unavailable to do it this week.  He wants to know if he will need a drivier for this test so he can arrange a ride.

## 2023-06-15 ENCOUNTER — Other Ambulatory Visit: Payer: Self-pay | Admitting: Podiatry

## 2023-06-15 ENCOUNTER — Telehealth: Payer: Self-pay | Admitting: Cardiovascular Disease

## 2023-06-15 NOTE — Telephone Encounter (Signed)
Called and spoke with patient. Patient says that he is going to have to have an amputation. Patient states that he is not going to be able to schedule his TEE until after he finds out when his surgery will be scheduled for. Patient states that he will call us back to schedule his TEE once his surgery is scheduled.

## 2023-06-15 NOTE — Telephone Encounter (Signed)
Called patient and left message for call back.

## 2023-06-15 NOTE — Telephone Encounter (Signed)
Returning call to a nurse. He is a little annoyed that he keeps missing the calls. He said he will leave at 12:30 for work.

## 2023-06-15 NOTE — Telephone Encounter (Signed)
See other telephone encounter.

## 2023-06-22 ENCOUNTER — Encounter: Payer: Self-pay | Admitting: Cardiovascular Disease

## 2023-06-23 ENCOUNTER — Encounter (HOSPITAL_COMMUNITY): Payer: Self-pay | Admitting: Urgent Care

## 2023-06-23 ENCOUNTER — Encounter
Admission: RE | Admit: 2023-06-23 | Discharge: 2023-06-23 | Disposition: A | Discharge status: Home / Self Care | Payer: BC Managed Care – PPO | Source: Ambulatory Visit | Attending: Podiatry | Admitting: Podiatry

## 2023-06-23 VITALS — Ht 73.0 in | Wt 195.1 lb

## 2023-06-23 DIAGNOSIS — Z01818 Encounter for other preprocedural examination: Secondary | ICD-10-CM

## 2023-06-23 HISTORY — DX: Hypothyroidism, unspecified: E03.9

## 2023-06-23 HISTORY — DX: Other hammer toe(s) (acquired), right foot: M20.41

## 2023-06-23 HISTORY — DX: Hypokalemia: E87.6

## 2023-06-23 HISTORY — DX: Personal history of diseases of the skin and subcutaneous tissue: Z87.2

## 2023-06-23 HISTORY — DX: Osteomyelitis, unspecified: M86.9

## 2023-06-23 HISTORY — DX: Hyperlipidemia, unspecified: E78.5

## 2023-06-23 HISTORY — DX: Bacteremia: R78.81

## 2023-06-23 HISTORY — DX: Type 2 diabetes mellitus without complications: E11.9

## 2023-06-23 HISTORY — DX: Charcot's joint, right ankle and foot: M14.671

## 2023-06-23 HISTORY — DX: Acquired absence of left foot: Z89.432

## 2023-06-23 HISTORY — DX: Gastro-esophageal reflux disease without esophagitis: K21.9

## 2023-06-23 NOTE — Patient Instructions (Addendum)
Your procedure is scheduled on:06-29-23 Tuesday Report to the Registration Desk on the 1st floor of the [facility]. Then proceed to the 2nd floor Surgery Desk To find out your arrival time, please call [phone] between 1PM - 3PM on:06-28-23 Monday If your arrival time is 6:00 am, do not arrive before that time as the [facility] entrance doors do not open until 6:00 am.  REMEMBER: Instructions that are not followed completely may result in serious medical risk, up to and including death; or upon the discretion of your surgeon and anesthesiologist your surgery may need to be rescheduled.  Do not eat food after midnight the night before surgery.  No gum chewing or hard candies.  You may however, drink Water up to 2 hours before you are scheduled to arrive for your surgery. Do not drink anything within 2 hours of your scheduled arrival time.  In addition, your doctor has ordered for you to drink the provided:  Gatorade G2 Drinking this carbohydrate drink up to two hours before surgery helps to reduce insulin resistance and improve patient outcomes. Please complete drinking 2 hours before scheduled arrival time.  One week prior to surgery:Stop NOW (06-23-23) Stop Anti-inflammatories (NSAIDS) such as Advil, Aleve, Ibuprofen, Motrin, Naproxen, Naprosyn and Aspirin based products such as Excedrin, Goody's Powder, BC Powder. Stop ANY OVER THE COUNTER supplements until after surgery (Vitamin C, Vitamin D3)  You may however, continue to take Tylenol if needed for pain up until the day of surgery.  Stop Dapagliflozin-metFORMIN HCl ER (XIGDUO XR) 3 days prior to surgery-Last dose will be on 06-25-23 Friday  Stop RYBELSUS 1 day prior to surgery-Last dose will be on 06-27-23 Sunday  Continue taking all of your other prescription medications up until the day of surgery.  ON THE DAY OF SURGERY ONLY TAKE THESE MEDICATIONS WITH SIPS OF WATER: -levothyroxine (SYNTHROID)   Take half of your Lantus  Insulin the night prior to surgery and NO Insulin the morning of surgery  Ask Dr Alberteen Spindle at your office visit with him on 06-24-23 if/when you need to stop your 81 mg Aspirin  No Alcohol for 24 hours before or after surgery.  No Smoking including e-cigarettes for 24 hours before surgery.  No chewable tobacco products for at least 6 hours before surgery.  No nicotine patches on the day of surgery.  Do not use any "recreational" drugs for at least a week (preferably 2 weeks) before your surgery.  Please be advised that the combination of cocaine and anesthesia may have negative outcomes, up to and including death. If you test positive for cocaine, your surgery will be cancelled.  On the morning of surgery brush your teeth with toothpaste and water, you may rinse your mouth with mouthwash if you wish. Do not swallow any toothpaste or mouthwash.  Use CHG Soap as directed on instruction sheet.  Do not wear jewelry, make-up, hairpins, clips or nail polish.  For welded (permanent) jewelry: bracelets, anklets, waist bands, etc.  Please have this removed prior to surgery.  If it is not removed, there is a chance that hospital personnel will need to cut it off on the day of surgery.  Do not wear lotions, powders, or perfumes.   Do not shave body hair from the neck down 48 hours before surgery.  Contact lenses, hearing aids and dentures may not be worn into surgery.  Do not bring valuables to the hospital. [facility] is not responsible for any missing/lost belongings or valuables.  Notify your doctor if there is any change in your medical condition (cold, fever, infection).  Wear comfortable clothing (specific to your surgery type) to the hospital.  After surgery, you can help prevent lung complications by doing breathing exercises.  Take deep breaths and cough every 1-2 hours. Your doctor may order a device called an Incentive Spirometer to help you take deep breaths. When coughing or  sneezing, hold a pillow firmly against your incision with both hands. This is called "splinting." Doing this helps protect your incision. It also decreases belly discomfort.  If you are being admitted to the hospital overnight, leave your suitcase in the car. After surgery it may be brought to your room.  In case of increased patient census, it may be necessary for you, the patient, to continue your postoperative care in the Same Day Surgery department.  If you are being discharged the day of surgery, you will not be allowed to drive home. You will need a responsible individual to drive you home and stay with you for 24 hours after surgery.   If you are taking public transportation, you will need to have a responsible individual with you.  Please call the Pre-admissions Testing Dept. at [phone] if you have any questions about these instructions.  Surgery Visitation Policy:  Patients having surgery or a procedure may have two visitors.  Children under the age of 40 must have an adult with them who is not the patient.     Preparing for Surgery with CHLORHEXIDINE GLUCONATE (CHG) Soap  Chlorhexidine Gluconate (CHG) Soap  o An antiseptic cleaner that kills germs and bonds with the skin to continue killing germs even after washing  o Used for showering the night before surgery and morning of surgery  Before surgery, you can play an important role by reducing the number of germs on your skin.  CHG (Chlorhexidine gluconate) soap is an antiseptic cleanser which kills germs and bonds with the skin to continue killing germs even after washing.  Please do not use if you have an allergy to CHG or antibacterial soaps. If your skin becomes reddened/irritated stop using the CHG.  1. Shower the NIGHT BEFORE SURGERY and the MORNING OF SURGERY with CHG soap.  2. If you choose to wash your hair, wash your hair first as usual with your normal shampoo.  3. After shampooing, rinse your hair and  body thoroughly to remove the shampoo.  4. Use CHG as you would any other liquid soap. You can apply CHG directly to the skin and wash gently with a scrungie or a clean washcloth.  5. Apply the CHG soap to your body only from the neck down. Do not use on open wounds or open sores. Avoid contact with your eyes, ears, mouth, and genitals (private parts). Wash face and genitals (private parts) with your normal soap.  6. Wash thoroughly, paying special attention to the area where your surgery will be performed.  7. Thoroughly rinse your body with warm water.  8. Do not shower/wash with your normal soap after using and rinsing off the CHG soap.  9. Pat yourself dry with a clean towel.  10. Wear clean pajamas to bed the night before surgery.  12. Place clean sheets on your bed the night of your first shower and do not sleep with pets.  13. Shower again with the CHG soap on the day of surgery prior to arriving at the hospital.  14. Do not apply any deodorants/lotions/powders.  15.  Please wear clean clothes to the hospital.  How to Use an Incentive Spirometer An incentive spirometer is a tool that measures how well you are filling your lungs with each breath. Learning to take long, deep breaths using this tool can help you keep your lungs clear and active. This may help to reverse or lessen your chance of developing breathing (pulmonary) problems, especially infection. You may be asked to use a spirometer: After a surgery. If you have a lung problem or a history of smoking. After a long period of time when you have been unable to move or be active. If the spirometer includes an indicator to show the highest number that you have reached, your health care provider or respiratory therapist will help you set a goal. Keep a log of your progress as told by your health care provider. What are the risks? Breathing too quickly may cause dizziness or cause you to pass out. Take your time so you do not  get dizzy or light-headed. If you are in pain, you may need to take pain medicine before doing incentive spirometry. It is harder to take a deep breath if you are having pain. How to use your incentive spirometer  Sit up on the edge of your bed or on a chair. Hold the incentive spirometer so that it is in an upright position. Before you use the spirometer, breathe out normally. Place the mouthpiece in your mouth. Make sure your lips are closed tightly around it. Breathe in slowly and as deeply as you can through your mouth, causing the piston or the ball to rise toward the top of the chamber. Hold your breath for 3-5 seconds, or for as long as possible. If the spirometer includes a coach indicator, use this to guide you in breathing. Slow down your breathing if the indicator goes above the marked areas. Remove the mouthpiece from your mouth and breathe out normally. The piston or ball will return to the bottom of the chamber. Rest for a few seconds, then repeat the steps 10 or more times. Take your time and take a few normal breaths between deep breaths so that you do not get dizzy or light-headed. Do this every 1-2 hours when you are awake. If the spirometer includes a goal marker to show the highest number you have reached (best effort), use this as a goal to work toward during each repetition. After each set of 10 deep breaths, cough a few times. This will help to make sure that your lungs are clear. If you have an incision on your chest or abdomen from surgery, place a pillow or a rolled-up towel firmly against the incision when you cough. This can help to reduce pain while taking deep breaths and coughing. General tips When you are able to get out of bed: Walk around often. Continue to take deep breaths and cough in order to clear your lungs. Keep using the incentive spirometer until your health care provider says it is okay to stop using it. If you have been in the hospital, you may be  told to keep using the spirometer at home. Contact a health care provider if: You are having difficulty using the spirometer. You have trouble using the spirometer as often as instructed. Your pain medicine is not giving enough relief for you to use the spirometer as told. You have a fever. Get help right away if: You develop shortness of breath. You develop a cough with bloody mucus from the lungs.  You have fluid or blood coming from an incision site after you cough. Summary An incentive spirometer is a tool that can help you learn to take long, deep breaths to keep your lungs clear and active. You may be asked to use a spirometer after a surgery, if you have a lung problem or a history of smoking, or if you have been inactive for a long period of time. Use your incentive spirometer as instructed every 1-2 hours while you are awake. If you have an incision on your chest or abdomen, place a pillow or a rolled-up towel firmly against your incision when you cough. This will help to reduce pain. Get help right away if you have shortness of breath, you cough up bloody mucus, or blood comes from your incision when you cough. This information is not intended to replace advice given to you by your health care provider. Make sure you discuss any questions you have with your health care provider. Document Revised: 09/25/2019 Document Reviewed: 09/25/2019 Elsevier Patient Education  2024 [employer].

## 2023-06-25 ENCOUNTER — Telehealth: Payer: Self-pay | Admitting: *Deleted

## 2023-06-25 NOTE — Telephone Encounter (Signed)
Dr. Mariah Milling You saw this patient last month and ordered another echo. We are asked for preop risk stratification for toe amputation. After reading your note, I am not certain he his able to complete METS and we would not be able to clear him with a virtual visit. In addition, you have recommended TEE for severe MR.    Given need for toe amputation and severe valvular disease, can you please make recommendations for the order of procedures? Should he have TEE prior to amputation?  Thanks Angie

## 2023-06-25 NOTE — Telephone Encounter (Signed)
1. What type of surgery is being performed?  AMPUTATION RIGHT SECOND TOE WITH PARTIAL RAY RESECTION   2. When is this surgery scheduled?  06/29/2023    3. Type of clearance being requested (medical, pharmacy, both)?  MEDICAL    4. Are there any medications that need to be held prior to surgery?  ASA   5. Practice name and name of physician performing surgery?  Performing surgeon: Dr. Linus Galas, DPM  Requesting clearance: Quentin Mulling, FNP-C     6. Anesthesia type (none, local, MAC, general)?  GENERAL   7. What is the office phone and fax number?   Fax: [phone]

## 2023-06-25 NOTE — Telephone Encounter (Signed)
-----   Message from Verlee Monte sent at 06/23/2023  1:29 PM EST ----- Regarding: Request for pre-operative cardiac clearance Request for pre-operative cardiac clearance:  1. What type of surgery is being performed?  AMPUTATION RIGHT SECOND TOE WITH PARTIAL RAY RESECTION  2. When is this surgery scheduled?  06/29/2023  3. Type of clearance being requested (medical, pharmacy, both)? MEDICAL   4. Are there any medications that need to be held prior to surgery? ASA  5. Practice name and name of physician performing surgery?  Performing surgeon: Dr. Linus Galas, DPM Requesting clearance: Quentin Mulling, FNP-C    6. Anesthesia type (none, local, MAC, general)? GENERAL  7. What is the office phone and fax number?   Fax: [phone]  ATTENTION: Unable to create telephone message as per your standard workflow. Directed by [facility] providers to send requests for cardiac clearance to this pool for appropriate distribution to provider covering pre-operative clearances.   Quentin Mulling, MSN, APRN, FNP-C, CEN [facility]  Peri-operative Services Nurse Practitioner Phone: [phone] 06/23/23 1:29 PM

## 2023-06-26 LAB — CBC
Hematocrit: 48 % (ref 37.5–51.0)
Hemoglobin: 15.3 g/dL (ref 13.0–17.7)
MCH: 29.2 pg (ref 26.6–33.0)
MCHC: 31.9 g/dL (ref 31.5–35.7)
MCV: 92 fL (ref 79–97)
Platelets: 230 10*3/uL (ref 150–450)
RBC: 5.24 x10E6/uL (ref 4.14–5.80)
RDW: 13.1 % (ref 11.6–15.4)
WBC: 10 10*3/uL (ref 3.4–10.8)

## 2023-06-26 LAB — BASIC METABOLIC PANEL
BUN/Creatinine Ratio: 21 (ref 10–24)
CO2: 25 mmol/L (ref 20–29)
Calcium: 9.9 mg/dL (ref 8.6–10.2)
Chloride: 100 mmol/L (ref 96–106)
Creatinine, Ser: 21 mg/dL (ref 10–24)
Glucose: 223 mg/dL — ABNORMAL HIGH (ref 70–99)
Potassium: 4 mmol/L (ref 3.5–5.2)
Sodium: 141 mmol/L (ref 134–144)
eGFR: 89 mL/min/{1.73_m2} (ref >59)
eGFR: 89 mg/dL (ref 8–27)

## 2023-06-29 ENCOUNTER — Encounter: Admission: RE | Payer: Self-pay | Source: Home / Self Care

## 2023-06-29 ENCOUNTER — Ambulatory Visit: Admission: RE | Admit: 2023-06-29 | Payer: BC Managed Care – PPO | Source: Home / Self Care | Admitting: Podiatry

## 2023-06-29 ENCOUNTER — Telehealth: Payer: Self-pay | Admitting: Emergency Medicine

## 2023-06-29 ENCOUNTER — Encounter: Payer: Self-pay | Admitting: Cardiovascular Disease

## 2023-06-29 SURGERY — AMPUTATION, FOOT, RAY
Anesthesia: Choice | Laterality: Right

## 2023-06-29 NOTE — Telephone Encounter (Signed)
Left message for patient to call back to schedule TEE.

## 2023-07-07 ENCOUNTER — Ambulatory Visit: Admit: 2023-07-07 | Payer: BC Managed Care – PPO | Admitting: Cardiovascular Disease

## 2023-07-07 SURGERY — TRANSESOPHAGEAL ECHOCARDIOGRAM (TEE)
Anesthesia: General

## 2023-09-28 NOTE — Telephone Encounter (Signed)
 Patient is requesting to speak with RN Marcelino Duster in regard to the TEE. Please advise.

## 2023-09-29 ENCOUNTER — Other Ambulatory Visit: Payer: Self-pay | Admitting: Emergency Medicine

## 2023-09-29 DIAGNOSIS — Z79899 Other long term (current) drug therapy: Secondary | ICD-10-CM

## 2023-10-01 ENCOUNTER — Other Ambulatory Visit: Payer: Self-pay | Admitting: *Deleted

## 2023-10-01 DIAGNOSIS — Z79899 Other long term (current) drug therapy: Secondary | ICD-10-CM

## 2023-10-02 LAB — CBC
Hematocrit: 45.9 % (ref 37.5–51.0)
Hemoglobin: 15.1 g/dL (ref 13.0–17.7)
MCH: 29.7 pg (ref 26.6–33.0)
MCHC: 32.9 g/dL (ref 31.5–35.7)
MCV: 90 fL (ref 79–97)
Platelets: 231 10*3/uL (ref 150–450)
RBC: 5.08 x10E6/uL (ref 4.14–5.80)
RDW: 13.2 % (ref 11.6–15.4)
WBC: 9.5 10*3/uL (ref 3.4–10.8)

## 2023-10-02 LAB — BASIC METABOLIC PANEL
BUN/Creatinine Ratio: 20 (ref 10–24)
BUN: 19 mg/dL (ref 8–27)
CO2: 23 mmol/L (ref 20–29)
Calcium: 9.5 mg/dL (ref 8.6–10.2)
Chloride: 102 mmol/L (ref 96–106)
Creatinine, Ser: 0.93 mg/dL (ref 0.76–1.27)
Glucose: 225 mg/dL — ABNORMAL HIGH (ref 70–99)
Potassium: 4 mmol/L (ref 3.5–5.2)
Sodium: 142 mmol/L (ref 134–144)
eGFR: 91 mL/min/{1.73_m2} (ref >59)

## 2023-10-03 ENCOUNTER — Encounter: Payer: Self-pay | Admitting: Cardiovascular Disease

## 2023-10-04 NOTE — Progress Notes (Signed)
 CC: Chief Complaint  Patient presents with  . Follow-up    Recheck right ft ulcer.    Dereke presents for follow up of the ulceration on his right foot.  Still bandaging and trying to offload with pressure on his heel.   Objective: Continued ulceration beneath the right second metatarsal measuring approximately 3 mm diameter predebridement and 4 mm diameter postdebridement with depth of 1 mm with granular base.  No sign of infection.  Assessment: Encounter Diagnoses  Name Primary?  . Ulcer of right foot, limited to breakdown of skin (CMS/HHS-HCC) Yes  . Type 2 diabetes mellitus with polyneuropathy (CMS/HHS-HCC)     Plan: Debrided vitalized tissue from the ulcerative area on the right foot sharply using a 15 blade and tissue nippers including the epidermal and dermal layers.  Bacitracin  and sterile bandage applied.  Continue offloading with pressure on the heel.  Patient will follow-up with one of my partners in 1 month for reevaluation.  No orders of the defined types were placed in this encounter.

## 2023-10-07 ENCOUNTER — Ambulatory Visit: Admission: RE | Admit: 2023-10-07 | Source: Home / Self Care | Admitting: Cardiovascular Disease

## 2023-10-07 ENCOUNTER — Encounter: Admission: RE | Payer: Self-pay | Source: Home / Self Care

## 2023-10-07 SURGERY — ECHOCARDIOGRAM, TRANSESOPHAGEAL
Anesthesia: General

## 2023-11-11 NOTE — Progress Notes (Signed)
 Chief Complaint:   Chief Complaint  Patient presents with  . Follow-up    Recheck right foot ulcer     Subjective  HPI  Alexander Gill is a 68 y.o. male who presents for Follow-up (Recheck right foot ulcer ) History of Present Illness Alexander Gill is a 68 year old male who presents with a chronic open wound on the foot and a dislocated toe.  He has been dealing with a chronic open wound on the ball of his foot for approximately two months. The wound initially started as a growth on the side of his foot, which healed and resolved, but then issues began at the bottom of the foot. He believes the dislocated toe is contributing to the problem by pushing a bone down, causing significant pressure and preventing healing.  He changes the bandage on the wound every other day and applies a heavy Band-Aid with triple antibiotic ointment. He is concerned about the potential for infection, especially with increased outdoor activities and yard work, which he is trying to avoid due to the condition of his foot.  He has previously experienced a similar issue on the left side, which he describes as having been 'as bad as it gets'. Life currently limits his activities, and he is cautious about the wound, keeping a close eye on it.  Review of Systems  Patient Active Problem List  Diagnosis  . Hypertension  . Osteomyelitis of toe of right foot (CMS/HHS-HCC)  . Type 2 diabetes mellitus with diabetic polyneuropathy, with long-term current use of insulin  (CMS/HHS-HCC)  . History of amputation of lesser toe of right foot (CMS/HHS-HCC)  . HLD (hyperlipidemia)  . Hypothyroidism  . Osteomyelitis (CMS/HHS-HCC)  . Cellulitis  . Diabetic foot infection  (CMS/HHS-HCC)  . Type 2 diabetes mellitus with hyperglycemia, with long-term current use of insulin  (CMS/HHS-HCC)  . Acute bacterial endocarditis (HHS-HCC)  . Cellulitis of right lower extremity  . Hypokalemia  . MSSA bacteremia  . Sepsis (CMS/HHS-HCC)  . Type 2  diabetes mellitus with diabetic foot ulcer (CMS/HHS-HCC)    Outpatient Medications Prior to Visit  Medication Sig Dispense Refill  . ascorbic acid , vitamin C , (VITAMIN C ) 500 MG tablet Take 500 mg by mouth once daily    . aspirin  81 MG EC tablet Take 81 mg by mouth once daily    . blood-glucose sensor (DEXCOM G7 SENSOR) Devi Use 1 each every 10 (ten) days 9 each 3  . cholecalciferol  (VITAMIN D3) 1000 unit tablet Take 1,000 Units by mouth once daily    . insulin  GLARGINE (LANTUS  SOLOSTAR) pen injector (concentration 100 units/mL) Inject 38-40 Units subcutaneously at bedtime    . levothyroxine  (SYNTHROID ) 25 MCG tablet Take 25 mcg by mouth every morning    . losartan -hydrochlorothiazide  (HYZAAR) 100-25 mg tablet Take 1 tablet by mouth every morning    . multivitamin tablet Take 1 tablet by mouth once daily    . rosuvastatin  (CRESTOR ) 20 MG tablet Take 20 mg by mouth once daily    . RYBELSUS 3 mg tablet Take 3 mg by mouth once daily (Patient not taking: Reported on 08/16/2023)    . semaglutide (RYBELSUS) 3 mg tablet Take 1 tablet by mouth once daily    . XIGDUO  XR 5-1,000 mg XR 24 hr bipahsic tablet Take 1 tablet by mouth 2 (two) times daily     No facility-administered medications prior to visit.      Objective  Vitals:   11/11/23 0855  BP: (!) 148/77  Weight: 86.2  kg (190 lb 0.6 oz)  Height: 182.9 cm (6')  PainSc: 0-No pain   Body mass index is 25.77 kg/m.  Home Vitals:     Physical Exam Physical Exam    The following labs were personally reviewed: -No results found for: WBC, HGB, HCT, PLT -No results found for: NA, K, CL, CO2, BUN, CREATININE, GLUCOSE -No results found for: NA, K, CL, CO2, BUN, CREATININE, CALCIUM , TP, ALB, TBILI, ALKPHOS, AST, ALT, GLUCOSE, GFR - Lab Results  Component Value Date   HGBA1C 7.4 (H) 08/25/2023      Results RADIOLOGY Foot X-ray: Dislocated toe with inferior displacement of the  bone     Assessment/Plan:   Assessment & Plan Dislocated toe with chronic open wound Chronic open wound on the ball of the foot due to dislocated toe, present for approximately two months. The dislocated toe is causing increased pressure on the bone beneath, preventing healing. The wound is not currently infected, but there is a risk of infection if left untreated. Surgical intervention is recommended to remove the toe and possibly lift or remove part of the bone to alleviate pressure and promote healing. He is advised to consider surgery within the next few weeks to prevent complications such as infection. He is currently managing the wound with a heavy bandage and triple antibiotic ointment, and is advised to use a donut pad to reduce pressure. - Review x-rays to assess the extent of dislocation and bone involvement. - Encourage scheduling surgery within the next few weeks to remove the dislocated toe and possibly adjust the bone. - Advise using a donut pad around the wound to reduce pressure. - Continue applying a heavy bandage with triple antibiotic ointment. - Report any signs of infection or deterioration. - Schedule follow-up appointment in a few weeks to reassess the wound and discuss surgical options.  Diagnoses and all orders for this visit:  Type 2 diabetes mellitus with polyneuropathy (CMS/HHS-HCC)  S/P transmetatarsal amputation of foot, left (CMS/HHS-HCC)  Ulcer of right foot, limited to breakdown of skin (CMS/HHS-HCC)          Future Appointments     Date/Time Provider Department Center Visit Type   11/15/2023 9:45 AM Standley Dorothyann Dragon, MD [facility] C RETURN VISIT   12/07/2023 9:15 AM Ashley Eva Dawn, DPM [facility] C RETURN PODIATRY       There are no Patient Instructions on file for this visit.   This note has been created using automated tools and reviewed for accuracy by JUSTIN ALLEN FOWLER.

## 2023-11-19 ENCOUNTER — Ambulatory Visit: Payer: BC Managed Care – PPO | Admitting: Nurse Practitioner

## 2024-02-21 ENCOUNTER — Inpatient Hospital Stay
Admission: EM | Admit: 2024-02-21 | Discharge: 2024-02-25 | DRG: 617 | Disposition: A | Discharge status: Home / Self Care | Source: Ambulatory Visit | Attending: Internal Medicine | Admitting: Internal Medicine

## 2024-02-21 ENCOUNTER — Encounter: Payer: Self-pay | Admitting: Emergency Medicine

## 2024-02-21 ENCOUNTER — Other Ambulatory Visit: Payer: Self-pay

## 2024-02-21 DIAGNOSIS — E11621 Type 2 diabetes mellitus with foot ulcer: Secondary | ICD-10-CM | POA: Diagnosis present

## 2024-02-21 DIAGNOSIS — E785 Hyperlipidemia, unspecified: Secondary | ICD-10-CM | POA: Diagnosis present

## 2024-02-21 DIAGNOSIS — Z794 Long term (current) use of insulin: Secondary | ICD-10-CM | POA: Diagnosis not present

## 2024-02-21 DIAGNOSIS — Z5986 Financial insecurity: Secondary | ICD-10-CM

## 2024-02-21 DIAGNOSIS — Z7982 Long term (current) use of aspirin: Secondary | ICD-10-CM

## 2024-02-21 DIAGNOSIS — M86171 Other acute osteomyelitis, right ankle and foot: Secondary | ICD-10-CM | POA: Diagnosis present

## 2024-02-21 DIAGNOSIS — L97519 Non-pressure chronic ulcer of other part of right foot with unspecified severity: Secondary | ICD-10-CM | POA: Diagnosis present

## 2024-02-21 DIAGNOSIS — Z833 Family history of diabetes mellitus: Secondary | ICD-10-CM

## 2024-02-21 DIAGNOSIS — E039 Hypothyroidism, unspecified: Secondary | ICD-10-CM | POA: Diagnosis present

## 2024-02-21 DIAGNOSIS — M869 Osteomyelitis, unspecified: Secondary | ICD-10-CM

## 2024-02-21 DIAGNOSIS — I340 Nonrheumatic mitral (valve) insufficiency: Secondary | ICD-10-CM | POA: Diagnosis present

## 2024-02-21 DIAGNOSIS — Z89421 Acquired absence of other right toe(s): Secondary | ICD-10-CM | POA: Diagnosis not present

## 2024-02-21 DIAGNOSIS — Z79899 Other long term (current) drug therapy: Secondary | ICD-10-CM

## 2024-02-21 DIAGNOSIS — Z7984 Long term (current) use of oral hypoglycemic drugs: Secondary | ICD-10-CM | POA: Diagnosis not present

## 2024-02-21 DIAGNOSIS — M86671 Other chronic osteomyelitis, right ankle and foot: Secondary | ICD-10-CM | POA: Diagnosis present

## 2024-02-21 DIAGNOSIS — Z89432 Acquired absence of left foot: Secondary | ICD-10-CM | POA: Diagnosis not present

## 2024-02-21 DIAGNOSIS — Z89422 Acquired absence of other left toe(s): Secondary | ICD-10-CM | POA: Diagnosis not present

## 2024-02-21 DIAGNOSIS — L84 Corns and callosities: Secondary | ICD-10-CM | POA: Diagnosis present

## 2024-02-21 DIAGNOSIS — E1169 Type 2 diabetes mellitus with other specified complication: Secondary | ICD-10-CM | POA: Diagnosis present

## 2024-02-21 DIAGNOSIS — E11628 Type 2 diabetes mellitus with other skin complications: Secondary | ICD-10-CM | POA: Diagnosis present

## 2024-02-21 DIAGNOSIS — K219 Gastro-esophageal reflux disease without esophagitis: Secondary | ICD-10-CM | POA: Diagnosis present

## 2024-02-21 DIAGNOSIS — Z79890 Hormone replacement therapy (postmenopausal): Secondary | ICD-10-CM

## 2024-02-21 DIAGNOSIS — E11610 Type 2 diabetes mellitus with diabetic neuropathic arthropathy: Secondary | ICD-10-CM | POA: Diagnosis present

## 2024-02-21 DIAGNOSIS — Z22322 Carrier or suspected carrier of Methicillin resistant Staphylococcus aureus: Secondary | ICD-10-CM

## 2024-02-21 DIAGNOSIS — E1165 Type 2 diabetes mellitus with hyperglycemia: Secondary | ICD-10-CM | POA: Diagnosis present

## 2024-02-21 DIAGNOSIS — I10 Essential (primary) hypertension: Secondary | ICD-10-CM | POA: Diagnosis present

## 2024-02-21 DIAGNOSIS — E1140 Type 2 diabetes mellitus with diabetic neuropathy, unspecified: Secondary | ICD-10-CM | POA: Diagnosis present

## 2024-02-21 DIAGNOSIS — L03115 Cellulitis of right lower limb: Secondary | ICD-10-CM | POA: Diagnosis present

## 2024-02-21 DIAGNOSIS — Z8619 Personal history of other infectious and parasitic diseases: Secondary | ICD-10-CM

## 2024-02-21 DIAGNOSIS — L089 Local infection of the skin and subcutaneous tissue, unspecified: Principal | ICD-10-CM

## 2024-02-21 LAB — CBC WITH DIFFERENTIAL/PLATELET
Abs Immature Granulocytes: 0.03 K/uL (ref 0.00–0.07)
Basophils Absolute: 0.1 K/uL (ref 0.0–0.1)
Basophils Relative: 1 %
Eosinophils Absolute: 0.5 K/uL (ref 0.0–0.5)
Eosinophils Relative: 5 %
HCT: 46.6 % (ref 39.0–52.0)
Hemoglobin: 15.5 g/dL (ref 13.0–17.0)
Immature Granulocytes: 0 %
Lymphocytes Relative: 17 %
Lymphs Abs: 1.6 K/uL (ref 0.7–4.0)
MCH: 29.1 pg (ref 26.0–34.0)
MCHC: 33.3 g/dL (ref 30.0–36.0)
MCV: 87.6 fL (ref 80.0–100.0)
Monocytes Absolute: 0.8 K/uL (ref 0.1–1.0)
Monocytes Relative: 8 %
Neutro Abs: 6.9 K/uL (ref 1.7–7.7)
Neutrophils Relative %: 69 %
Platelets: 258 K/uL (ref 150–400)
RBC: 5.32 MIL/uL (ref 4.22–5.81)
RDW: 13.5 % (ref 11.5–15.5)
WBC: 9.9 K/uL (ref 4.0–10.5)
nRBC: 0 % (ref 0.0–0.2)

## 2024-02-21 LAB — COMPREHENSIVE METABOLIC PANEL WITH GFR
ALT: 19 U/L (ref 0–44)
AST: 23 U/L (ref 15–41)
Albumin: 4.3 g/dL (ref 3.5–5.0)
Alkaline Phosphatase: 77 U/L (ref 38–126)
Anion gap: 12 (ref 5–15)
BUN: 23 mg/dL (ref 8–23)
CO2: 23 mmol/L (ref 22–32)
Calcium: 9.6 mg/dL (ref 8.9–10.3)
Chloride: 105 mmol/L (ref 98–111)
Creatinine, Ser: 0.78 mg/dL (ref 0.61–1.24)
GFR, Estimated: >60 mL/min (ref >60)
Glucose, Bld: 213 mg/dL — ABNORMAL HIGH (ref 70–99)
Potassium: 3.8 mmol/L (ref 3.5–5.1)
Sodium: 140 mmol/L (ref 135–145)
Total Bilirubin: 0.8 mg/dL (ref 0.0–1.2)
Total Protein: 7.7 g/dL (ref 6.5–8.1)

## 2024-02-21 LAB — GLUCOSE, CAPILLARY
Glucose-Capillary: 105 mg/dL — ABNORMAL HIGH (ref 70–99)
Glucose-Capillary: 181 mg/dL — ABNORMAL HIGH (ref 70–99)

## 2024-02-21 LAB — LACTIC ACID, PLASMA: Lactic Acid, Venous: 1.7 mmol/L (ref 0.5–1.9)

## 2024-02-21 LAB — HIV ANTIBODY (ROUTINE TESTING W REFLEX): HIV Screen 4th Generation wRfx: NONREACTIVE

## 2024-02-21 MED ORDER — ONDANSETRON HCL 4 MG/2ML IJ SOLN: 4.0000 mg | Freq: Four times a day (QID) | INTRAMUSCULAR | Status: DC | PRN

## 2024-02-21 MED ORDER — LEVOTHYROXINE SODIUM 25 MCG PO TABS
25.0000 ug | ORAL_TABLET | Freq: Every day | ORAL | Status: DC
  Administered 2024-02-22 – 2024-02-25 (×4): 25 ug via ORAL
  Filled 2024-02-21 (×4): qty 1

## 2024-02-21 MED ORDER — LOSARTAN POTASSIUM-HCTZ 100-25 MG PO TABS: 1.0000 | ORAL_TABLET | ORAL | Status: DC

## 2024-02-21 MED ORDER — ASPIRIN 81 MG PO TBEC
81.0000 mg | DELAYED_RELEASE_TABLET | Freq: Every day | ORAL | Status: DC
  Administered 2024-02-21 – 2024-02-25 (×5): 81 mg via ORAL
  Filled 2024-02-21 (×5): qty 1

## 2024-02-21 MED ORDER — ONDANSETRON HCL 4 MG PO TABS
4.0000 mg | ORAL_TABLET | Freq: Four times a day (QID) | ORAL | Status: DC | PRN
Start: 2024-02-21 — End: 2024-02-25

## 2024-02-21 MED ORDER — ACETAMINOPHEN 325 MG PO TABS: 650.0000 mg | ORAL_TABLET | Freq: Four times a day (QID) | ORAL | Status: DC | PRN

## 2024-02-21 MED ORDER — POLYETHYLENE GLYCOL 3350 17 G PO PACK: 17.0000 g | PACK | Freq: Every day | ORAL | Status: DC | PRN

## 2024-02-21 MED ORDER — PIPERACILLIN-TAZOBACTAM 3.375 G IVPB
3.3750 g | Freq: Three times a day (TID) | INTRAVENOUS | Status: DC
  Administered 2024-02-21 – 2024-02-22 (×3): 3.375 g via INTRAVENOUS
  Filled 2024-02-21 (×4): qty 50

## 2024-02-21 MED ORDER — ENOXAPARIN SODIUM 40 MG/0.4ML IJ SOSY
40.0000 mg | PREFILLED_SYRINGE | INTRAMUSCULAR | Status: DC
  Administered 2024-02-21 – 2024-02-24 (×4): 40 mg via SUBCUTANEOUS
  Filled 2024-02-21 (×4): qty 0.4

## 2024-02-21 MED ORDER — INSULIN ASPART 100 UNIT/ML IJ SOLN: 0.0000 [IU] | Freq: Three times a day (TID) | INTRAMUSCULAR | Status: DC

## 2024-02-21 MED ORDER — ROSUVASTATIN CALCIUM 20 MG PO TABS
20.0000 mg | ORAL_TABLET | Freq: Every evening | ORAL | Status: DC
  Administered 2024-02-21 – 2024-02-24 (×4): 20 mg via ORAL
  Filled 2024-02-21 (×5): qty 1

## 2024-02-21 MED ORDER — VANCOMYCIN HCL 1250 MG/250ML IV SOLN
1250.0000 mg | Freq: Two times a day (BID) | INTRAVENOUS | Status: DC
  Administered 2024-02-22: 1250 mg via INTRAVENOUS
  Filled 2024-02-21 (×2): qty 250

## 2024-02-21 MED ORDER — HYDROCHLOROTHIAZIDE 25 MG PO TABS
25.0000 mg | ORAL_TABLET | Freq: Every day | ORAL | Status: DC
  Administered 2024-02-23 – 2024-02-25 (×3): 25 mg via ORAL
  Filled 2024-02-21 (×4): qty 1

## 2024-02-21 MED ORDER — INSULIN GLARGINE-YFGN 100 UNIT/ML ~~LOC~~ SOLN
20.0000 [IU] | Freq: Every day | SUBCUTANEOUS | Status: DC
  Administered 2024-02-21: 20 [IU] via SUBCUTANEOUS
  Filled 2024-02-21: qty 0.2

## 2024-02-21 MED ORDER — INSULIN ASPART 100 UNIT/ML IJ SOLN
0.0000 [IU] | Freq: Three times a day (TID) | INTRAMUSCULAR | Status: DC
  Administered 2024-02-22: 8 [IU] via SUBCUTANEOUS
  Filled 2024-02-21: qty 1

## 2024-02-21 MED ORDER — VITAMIN C 500 MG PO TABS
250.0000 mg | ORAL_TABLET | Freq: Two times a day (BID) | ORAL | Status: DC
  Administered 2024-02-21 – 2024-02-25 (×8): 250 mg via ORAL
  Filled 2024-02-21 (×8): qty 1

## 2024-02-21 MED ORDER — VANCOMYCIN HCL 2000 MG/400ML IV SOLN
2000.0000 mg | Freq: Once | INTRAVENOUS | Status: AC
  Administered 2024-02-21: 2000 mg via INTRAVENOUS
  Filled 2024-02-21 (×2): qty 400

## 2024-02-21 MED ORDER — HYDROCODONE-ACETAMINOPHEN 5-325 MG PO TABS: 1.0000 | ORAL_TABLET | ORAL | Status: DC | PRN

## 2024-02-21 MED ORDER — PIPERACILLIN-TAZOBACTAM 3.375 G IVPB 30 MIN: 3.3750 g | Freq: Once | INTRAVENOUS | Status: DC

## 2024-02-21 MED ORDER — LOSARTAN POTASSIUM 50 MG PO TABS
100.0000 mg | ORAL_TABLET | Freq: Every day | ORAL | Status: DC
  Administered 2024-02-23 – 2024-02-25 (×3): 100 mg via ORAL
  Filled 2024-02-21 (×4): qty 2

## 2024-02-21 MED ORDER — ACETAMINOPHEN 650 MG RE SUPP: 650.0000 mg | Freq: Four times a day (QID) | RECTAL | Status: DC | PRN

## 2024-02-21 MED ORDER — VANCOMYCIN HCL IN DEXTROSE 1-5 GM/200ML-% IV SOLN: 1000.0000 mg | Freq: Once | INTRAVENOUS | Status: DC

## 2024-02-21 MED ORDER — VITAMIN D 25 MCG (1000 UNIT) PO TABS
1000.0000 [IU] | ORAL_TABLET | Freq: Every evening | ORAL | Status: DC
  Administered 2024-02-21 – 2024-02-24 (×4): 1000 [IU] via ORAL
  Filled 2024-02-21 (×4): qty 1

## 2024-02-21 NOTE — Progress Notes (Signed)
 Pharmacy Antibiotic Note  Alexander Gill is a 68 y.o. male admitted on 02/21/2024 with osteomyelitis. Patient presenting from podiatry office visit after Xray showed evidence of osteomyelitis in right foot. PMH significant for T2DM, HTN, HLD, and hypothyroidism. Patient is afebrile with no leukocytosis. Pharmacy has been consulted for vancomycin  and Zosyn  dosing.   Plan: Vancomycin  load of 2000 mg IV x1 ordered Start vancomycin  1250 mg IV every 12 hours (eAUC 488.3, Scr 0.8, IBW used, Vd 0.72 L/kg) Start Zosyn  3.375 g IV every 8 hours Monitor renal function, clinical status, culture data, and LOT F/u podiatry consult and plans for intervention  Height: 6' (182.9 cm) Weight: 81.6 kg (180 lb) IBW/kg (Calculated) : 77.6  Temp (24hrs), Avg:98.2 F (36.8 C), Min:97.6 F (36.4 C), Max:98.8 F (37.1 C)  Recent Labs  Lab 02/21/24 1116  WBC 9.9  CREATININE 0.78  LATICACIDVEN 1.7    Estimated Creatinine Clearance: 99.7 mL/min (by C-G formula based on SCr of 0.78 mg/dL).    No Known Allergies  Antimicrobials this admission: Vancomycin  8/4 >>  Zosyn  8/4 >>   Dose adjustments this admission: N/A  Microbiology results: 8/4 BCx: pending  Thank you for involving pharmacy in this patient's care.   Damien Napoleon, PharmD Clinical Pharmacist 02/21/2024 2:25 PM

## 2024-02-21 NOTE — Consult Note (Signed)
 PODIATRIC SURGERY: CONSULT NOTE  Reason for Consult: RIGHT foot wound, c/f osteomyelitis    HPI: Alexander Gill is a 68 y.o. 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   Chronic wound is located to the plantar 2nd metatarsal head on the RIGHT foot that has been present for approximately 6 months. Wound appears to have developed gradually with the dorsal dislocation of the associated R 2nd digit and has progressively become worse over the past two months. He has been treating with local wound care to include topical ABX ointment and light gauze bandaging.    Endorses continued serous type drainage and size worsening.  Denies F/C/N/V.  Endorses weight loss but relays this is followed closely by endocrinology / cardiology.   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   GERD (gastroesophageal reflux disease)    h/o   Hammertoe of right foot    Heart abnormality    Enlarged left ventricle   History of cellulitis    Right LE   Hyperlipidemia    Hypertension    Hypokalemia    Hypothyroidism    MSSA bacteremia    Nose colonized with MRSA 2023   Osteomyelitis of left foot (HCC)    Pyogenic inflammation of bone (HCC)    S/P transmetatarsal amputation of foot, left (HCC)    Toe osteomyelitis (HCC)    right foot    Surgical Hx:  Past Surgical History:  Procedure Laterality Date   AMPUTATION Right 10/22/2021   Procedure: AMPUTATION RAY-PARTIAL 3RD RAY;  Surgeon: Lennie Barter, DPM;  Location: [facility];  Service: Podiatry;  Laterality: Right;   AMPUTATION Left 11/22/2022   Procedure: Transmetatarsal Amuptation Left Foot;  Surgeon: Neill Boas, DPM;  Location:  [facility];  Service: Podiatry;  Laterality: Left;   AMPUTATION TOE Right 07/05/2021   Procedure: AMPUTATION RIGHT 4TH TOE;  Surgeon: Neill Boas, DPM;  Location: [facility];  Service: Podiatry;  Laterality: Right;   HERNIA REPAIR     INCISION AND DRAINAGE OF WOUND Left 10/22/2021   Procedure: IRRIGATION AND DEBRIDEMENT WOUND;  Surgeon: Lennie Barter, DPM;  Location: [facility];  Service: Podiatry;  Laterality: Left;   TEE WITHOUT CARDIOVERSION N/A 11/26/2022   Procedure: TRANSESOPHAGEAL ECHOCARDIOGRAM;  Surgeon: Perla Evalene PARAS, MD;  Location: [facility];  Service: Cardiovascular;  Laterality: N/A;   TOE ARTHROPLASTY Left 10/22/2021   Procedure: TOE ARTHROPLASTY;  Surgeon: Lennie Barter, DPM;  Location: [facility];  Service: Podiatry;  Laterality: Left;    FHx:  Family History  Problem Relation Age of Onset   Diabetes Mother    Diabetes Father    Parkinson's disease Brother     Social History:  reports that he has never smoked. He has never used smokeless tobacco. He reports that he does not currently use alcohol. He reports that he does not currently use drugs.  Allergies: No Known Allergies  Review of Systems:  Medications Prior to Admission  Medication Sig Dispense Refill   aspirin  EC 81 MG tablet Take 81 mg by mouth daily. Swallow whole.     cholecalciferol  (VITAMIN D3) 25 MCG (1000 UNIT) tablet Take 1,000 Units  by mouth daily.     Dapagliflozin -metFORMIN  HCl ER (XIGDUO  XR) 11-998 MG TB24 Take 1 tablet by mouth in the morning and at bedtime.     insulin  glargine (LANTUS ) 100 UNIT/ML Solostar Pen Inject 38-40 Units into the skin every evening.     levothyroxine  (SYNTHROID ) 25 MCG tablet Take 25 mcg by mouth daily before breakfast.     losartan -hydrochlorothiazide  (HYZAAR) 100-25 MG tablet Take 1 tablet by mouth every morning.     rosuvastatin  (CRESTOR ) 20 MG tablet Take 20 mg by mouth every evening.     RYBELSUS 3 MG TABS Take 3 mg by mouth every evening.     amoxicillin -clavulanate  (AUGMENTIN ) 875-125 MG tablet Take 1 tablet by mouth 2 (two) times daily. (Patient not taking: Reported on 02/21/2024)     ascorbic acid  (VITAMIN C ) 250 MG tablet Take 1 tablet (250 mg total) by mouth 2 (two) times daily. (Patient not taking: Reported on 02/21/2024) 30 tablet 0   ascorbic acid  (VITAMIN C ) 500 MG tablet Take 500 mg by mouth daily. (Patient not taking: Reported on 02/21/2024)      Physical Exam: BP 139/84 (BP Location: Left Arm)   Pulse 65   Temp 97.6 F (36.4 C) (Oral)   Resp 18   Ht 6' (1.829 m)   Wt 81.6 kg   SpO2 100%   BMI 24.41 kg/m   General: Alert and oriented.  No apparent distress. Vascular: Cap refill to digits sluggish but apparent. Absent/ diminished pedal hair. DP/PT palpable.  Neuro: LOPS to the R foot  Derm: Dressings intact without strikethrough. Visible Full thickness ulceration noted to the plantar second MPJ of the R foot approx 1cm x 1cm. Mild Serous drainage. +PTB. Adjacent hyperkeratotic tissue.  MSK:  R foot - s/p 3rd digit amputation and 4th digit amputations ; hallux valgus with lateral deviation / abutting the second digit. Second digit with dorsal dislocation at the level of MPJ and contracture at PIPJ.  L foot - s/p TMA - no pre-ulcerative lesions identified.   Results for orders placed or performed during the hospital encounter of 02/21/24 (from the past 48 hours)  Lactic acid, plasma     Status: None   Collection Time: 02/21/24 11:16 AM  Result Value Ref Range   Lactic Acid, Venous 1.7 0.5 - 1.9 mmol/L    Comment: Performed at [facility], [address] [place], [place] [address]  Comprehensive metabolic panel     Status: Abnormal   Collection Time: 02/21/24 11:16 AM  Result Value Ref Range   Sodium 140 135 - 145 mmol/L   Potassium 3.8 3.5 - 5.1 mmol/L   Chloride 105 98 - 111 mmol/L   CO2 23 22 - 32 mmol/L   Glucose, Bld 213 (H) 70 - 99 mg/dL    Comment: Glucose reference range applies only to samples taken after fasting for  at least 8 hours.   BUN 23 8 - 23 mg/dL   Creatinine, Ser 9.21 0.61 - 1.24 mg/dL   Calcium  9.6 8.9 - 10.3 mg/dL   Total Protein 7.7 6.5 - 8.1 g/dL   Albumin 4.3 3.5 - 5.0 g/dL   AST 23 15 - 41 U/L   ALT 19 0 - 44 U/L   Alkaline Phosphatase 77 38 - 126 U/L   Total Bilirubin 0.8 0.0 - 1.2 mg/dL   GFR, Estimated >39 >39 mL/min    Comment: (NOTE) Calculated using the CKD-EPI Creatinine Equation (2021)    Anion gap 12  5 - 15    Comment: Performed at [facility], [address] [place], [place] [address]  CBC with Differential     Status: None   Collection Time: 02/21/24 11:16 AM  Result Value Ref Range   WBC 9.9 4.0 - 10.5 K/uL   RBC 5.32 4.22 - 5.81 MIL/uL   Hemoglobin 15.5 13.0 - 17.0 g/dL   HCT 53.3 60.9 - 47.9 %   MCV 87.6 80.0 - 100.0 fL   MCH 29.1 26.0 - 34.0 pg   MCHC 33.3 30.0 - 36.0 g/dL   RDW 86.4 88.4 - 84.4 %   Platelets 258 150 - 400 K/uL   nRBC 0.0 0.0 - 0.2 %   Neutrophils Relative % 69 %   Neutro Abs 6.9 1.7 - 7.7 K/uL   Lymphocytes Relative 17 %   Lymphs Abs 1.6 0.7 - 4.0 K/uL   Monocytes Relative 8 %   Monocytes Absolute 0.8 0.1 - 1.0 K/uL   Eosinophils Relative 5 %   Eosinophils Absolute 0.5 0.0 - 0.5 K/uL   Basophils Relative 1 %   Basophils Absolute 0.1 0.0 - 0.1 K/uL   Immature Granulocytes 0 %   Abs Immature Granulocytes 0.03 0.00 - 0.07 K/uL    Comment: Performed at [facility], [address] [place], [place] [address]   IMAGING: In office imaging from 02/21/2024 - read per colleague: RIGHT foot XRAY taken in the office revealed erosive changes at the plantar head of the second metatarsal consistent with osteomyelitis.   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   Plan Discussed with patient current wound status and r/b/a discussion was held between myself and the patient. Patient would like to proceed with procedure discussed previously with colleague Dr. Neill earlier today in form of second  digit amputation with partial ray resection. Please see note from Dr. Neill with further information on R/B/A discussion / consent. Plan for surgical intervention tomorrow of RIGHT foot.   - Diet: NPO at midnight. Plan for surgical intervention tomorrow 02/22/24.  - Wound care: Bandaging intact from clinic. Orders to be updated post op 02/22/24.  - ABX: Per medicine / pharmacy. Appreciate assistance with antibiotic stewardship from medicine and pharmacy services.  - Activity: Heel WB on RLE for transfer. WBAT on LLE.  - Dispo: Likely pending surgical intervention and pathology results for ABX tailoring.   Alexander Gill Alexander Gill 02/21/2024, 4:13 PM

## 2024-02-21 NOTE — Progress Notes (Signed)
 CC: Chief Complaint  Patient presents with  . Follow-up    Recheck right foot ulcer.     Alexander Gill presents for follow up of the ulceration on his right foot.  Relates some increased swelling in the second toe area.   Objective: Continued ulceration noted on the plantar aspect of the right second metatarsal head measuring approximately 10 mm x 8 mm predebridement as well as postdebridement with depth of 4 mm extending down to the level of exposed second metatarsal head.  Some clear joint fluid is present but no specific purulence.  Some increased edema noted in the right second toe and metatarsal phalangeal joint area.  Second toe is chronically dislocated on the dorsal aspect of the second metatarsal head.  X-rays: DP, oblique, and lateral of the right foot taken today and reviewed by myself reveals dislocation of the second toe onto the dorsal aspect of the second metatarsal head.  On the oblique view there is some cortical erosion along the plantar aspect of the second metatarsal head that was not noted on previous x-rays taken December 2024 consistent with osteomyelitis in the second metatarsal head.  Assessment: Encounter Diagnoses  Name Primary?  . Ulcer of right foot with fat layer exposed (CMS/HHS-HCC) Yes  . Type 2 diabetes mellitus with polyneuropathy (CMS/HHS-HCC)   . Status post amputation of toe of right foot (CMS/HHS-HCC)   . Acute osteomyelitis of right ankle or foot (CMS/HHS-HCC)     Plan: Debrided devitalized tissue from the ulceration on the right forefoot sharply using a 15 blade and tissue nippers including the epidermal and dermal layers as well as deeper full-thickness subcutaneous tissue down to the level of exposed bone.  Betadine gauze dressing applied.  Discussed with the patient the need for amputation of the second toe with removal of a portion of the second metatarsal.  At this point with the exposed bone recommended hospitalization and more urgent amputation and  patient was instructed to present to the emergency department.  States he will have to go home first to get a couple of things squared away but will present to the emergency department later today.  Plan for surgery within the next couple of days.  Orders Placed This Encounter  Procedures  . X-ray foot right 3 plus views    Standing Status:   Future    Number of Occurrences:   1    Expiration Date:   02/20/2025    This procedure is enabled for patient scheduling in MyChart. Please indicate if the patient should not be able to schedule an appointment for this procedure in MyChart.:   Allow MyChart Scheduling    Release to patient:   Immediate

## 2024-02-21 NOTE — ED Triage Notes (Signed)
 Patient to ED via POV for wound infection to right foot under the 2nd toe. Ongoing for over 1 year and sent to ED by Dr Neill for surgery.

## 2024-02-21 NOTE — ED Provider Notes (Signed)
 [facility] Provider Note    Event Date/Time   First MD Initiated Contact with Patient 02/21/24 1210     (approximate)   History   Wound Infection   HPI  Alexander Gill is a 68 y.o. male past medical history significant for diabetes who presents to the emergency department with concern for right foot infection.  Patient was evaluated by Dr. Fernande with podiatry today and sent to the emergency department with plans for surgery to nonhealing ulcer to his right foot.  Has a history of diabetes and prior amputations.  Denies fever or chills.  Evaluated today and states that he had an x-ray done and was told to come to the emergency department for concern for an infection.     Physical Exam   Triage Vital Signs: ED Triage Vitals [02/21/24 1114]  Encounter Vitals Group     BP (!) 149/72     Girls Systolic BP Percentile      Girls Diastolic BP Percentile      Boys Systolic BP Percentile      Boys Diastolic BP Percentile      Pulse Rate 64     Resp 18     Temp 98.8 F (37.1 C)     Temp Source Oral     SpO2 100 %     Weight 180 lb (81.6 kg)     Height 6' (1.829 m)     Head Circumference      Peak Flow      Pain Score 0     Pain Loc      Pain Education      Exclude from Growth Chart     Most recent vital signs: Vitals:   02/21/24 1114  BP: (!) 149/72  Pulse: 64  Resp: 18  Temp: 98.8 F (37.1 C)  SpO2: 100%    Physical Exam Constitutional:      Appearance: He is well-developed.  HENT:     Head: Atraumatic.  Eyes:     Conjunctiva/sclera: Conjunctivae normal.  Cardiovascular:     Rate and Rhythm: Regular rhythm.  Pulmonary:     Effort: No respiratory distress.  Musculoskeletal:     Cervical back: Normal range of motion.     Comments: Right foot with ulceration on the plantar surface just inferior to the second toe.  No surrounding erythema or warmth  Skin:    General: Skin is warm.  Neurological:     Mental Status: He is alert. Mental  status is at baseline.     IMPRESSION / MDM / ASSESSMENT AND PLAN / ED COURSE  I reviewed the triage vital signs and the nursing notes.  Differential diagnosis including osteomyelitis, chronic wound from diabetes, cellulitis   No tachycardic or bradycardic dysrhythmias while on cardiac telemetry.  LABS (all labs ordered are listed, but only abnormal results are displayed) Labs interpreted as -    Labs Reviewed  COMPREHENSIVE METABOLIC PANEL WITH GFR - Abnormal; Notable for the following components:      Result Value   Glucose, Bld 213 (*)    All other components within normal limits  LACTIC ACID, PLASMA  CBC WITH DIFFERENTIAL/PLATELET  LACTIC ACID, PLASMA     MDM  Lab work with no significant leukocytosis.  Creatinine at baseline with no electrolyte abnormality.  Hyperglycemia but no concern for DKA.  Normal lactic acid  Discussed the patient's case with podiatry on-call Dr. Lennie who recommended IV antibiotics and admission to the  hospitalist.  Uncertain of timing of surgery at this point.  Consulted hospitalist for admission for chronic nonhealing wound to the right foot with possible osteomyelitis.     PROCEDURES:  Critical Care performed: No  Procedures  Patient's presentation is most consistent with acute presentation with potential threat to life or bodily function.   MEDICATIONS ORDERED IN ED: Medications  vancomycin  (VANCOCIN ) IVPB 1000 mg/200 mL premix (has no administration in time range)  piperacillin -tazobactam (ZOSYN ) IVPB 3.375 g (has no administration in time range)    FINAL CLINICAL IMPRESSION(S) / ED DIAGNOSES   Final diagnoses:  Wound infection     Rx / DC Orders   ED Discharge Orders     None        Note:  This document was prepared using Dragon voice recognition software and may include unintentional dictation errors.   Suzanne Kirsch, MD 02/21/24 1249

## 2024-02-21 NOTE — Consult Note (Signed)
 ED Pharmacy Antibiotic Sign Off An antibiotic consult was received from an ED provider for vancomycin  per pharmacy dosing for wound infection. A chart review was completed to assess appropriateness.   The following one time order(s) were placed:  Vancomycin  1000 mg x 1.   Further antibiotic and/or antibiotic pharmacy consults should be ordered by the admitting provider if indicated.   Thank you for allowing pharmacy to be a part of this patient's care.   Cathaleen GORMAN Blanch, [facility]  Clinical Pharmacist 02/21/24 12:51 PM

## 2024-02-21 NOTE — H&P (Signed)
 History and Physical    Alexander Gill FMW:[id number] DOB: 28-Sep-1955 DOA: 02/21/2024  PCP: [facility]   Patient coming from: Home    Chief Complaint: Right foot wound  HPI: Alexander Gill is a 68 y.o. male with medical history significant of diabetes type 2, hypertension, hyperlipidemia, hypothyroidism who presented to the emergency department as recommended by his podiatrist for the evaluation of right foot wound. Patient has nonhealing ulcer on his right foot. He had an x-ray done at podiatry office and was recommended to go to the Emergency Department for IV antibiotics and further plan for surgery.  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 Patient seen and examined at bedside in the emergency department.  During my evaluation, he was very comfortable.  Hemodynamically stable.  Lab work does not show leukocytosis. No reported fever, chills, chest pain, shortness of breath, abdomen pain, nausea, vomiting.   ED Course: Patient remains hemodynamically stable throughout the emergency department stay.  Started on IV antibiotics.  Plan for surgery tomorrow.  Will be kept n.p.o. after midnight  Review of Systems: As per HPI otherwise 10 point review of systems negative.    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   GERD (gastroesophageal reflux disease)    h/o   Hammertoe of right foot    Heart abnormality    Enlarged left ventricle   History of cellulitis    Right LE   Hyperlipidemia    Hypertension    Hypokalemia    Hypothyroidism    MSSA bacteremia    Nose colonized with MRSA 2023   Osteomyelitis of left foot (HCC)    Pyogenic inflammation of bone (HCC)    S/P transmetatarsal amputation of foot, left (HCC)    Toe osteomyelitis (HCC)    right foot    Past Surgical History:  Procedure Laterality Date    AMPUTATION Right 10/22/2021   Procedure: AMPUTATION RAY-PARTIAL 3RD RAY;  Surgeon: Lennie Barter, DPM;  Location: [facility];  Service: Podiatry;  Laterality: Right;   AMPUTATION Left 11/22/2022   Procedure: Transmetatarsal Amuptation Left Foot;  Surgeon: Neill Boas, DPM;  Location: [facility];  Service: Podiatry;  Laterality: Left;   AMPUTATION TOE Right 07/05/2021   Procedure: AMPUTATION RIGHT 4TH TOE;  Surgeon: Neill Boas, DPM;  Location: [facility];  Service: Podiatry;  Laterality: Right;   HERNIA REPAIR     INCISION AND DRAINAGE OF WOUND Left 10/22/2021   Procedure: IRRIGATION AND DEBRIDEMENT WOUND;  Surgeon: Lennie Barter, DPM;  Location: [facility];  Service: Podiatry;  Laterality: Left;   TEE WITHOUT CARDIOVERSION N/A 11/26/2022   Procedure: TRANSESOPHAGEAL ECHOCARDIOGRAM;  Surgeon: Perla Evalene PARAS, MD;  Location: [facility];  Service: Cardiovascular;  Laterality: N/A;   TOE ARTHROPLASTY Left 10/22/2021   Procedure: TOE ARTHROPLASTY;  Surgeon: Lennie Barter, DPM;  Location: [facility];  Service: Podiatry;  Laterality: Left;     reports that he has never smoked. He has never used smokeless tobacco. He reports that he does not currently use alcohol. He reports that he does not currently use drugs.  No Known Allergies  Family History  Problem Relation Age of Onset   Diabetes Mother    Diabetes Father    Parkinson's disease Brother      Prior to Admission medications  Medication Sig Start Date End Date Taking? Authorizing Provider  amoxicillin -clavulanate (AUGMENTIN ) 875-125 MG tablet Take 1 tablet by mouth 2 (two) times daily.    [provider]  ascorbic acid  (VITAMIN C ) 250 MG tablet Take 1 tablet (250 mg total) by mouth 2 (two) times daily. 11/27/22   Dorinda Drue DASEN, MD  ascorbic acid  (VITAMIN C ) 500 MG tablet Take 500 mg by mouth daily.    [provider]  aspirin  EC 81 MG tablet Take 81 mg by mouth daily. Swallow whole.    [provider]  cholecalciferol  (VITAMIN  D3) 25 MCG (1000 UNIT) tablet Take 1,000 Units by mouth daily.    [provider]  Dapagliflozin -metFORMIN  HCl ER (XIGDUO  XR) 11-998 MG TB24 Take 1 tablet by mouth in the morning and at bedtime.    [provider]  insulin  glargine (LANTUS ) 100 UNIT/ML Solostar Pen Inject 38-40 Units into the skin every evening.    [provider]  levothyroxine  (SYNTHROID ) 25 MCG tablet Take 25 mcg by mouth daily before breakfast. 09/27/20   [provider]  losartan -hydrochlorothiazide  (HYZAAR) 100-25 MG tablet Take 1 tablet by mouth every morning.    [provider]  rosuvastatin  (CRESTOR ) 20 MG tablet Take 20 mg by mouth every evening. 10/16/20   [provider]  RYBELSUS 3 MG TABS Take 3 mg by mouth every evening.    [provider]  metFORMIN  (GLUCOPHAGE ) 1000 MG tablet Take 1,000 mg by mouth 2 (two) times daily. 12/10/20 01/17/21  [provider]    Physical Exam: Vitals:   02/21/24 1114  BP: (!) 149/72  Pulse: 64  Resp: 18  Temp: 98.8 F (37.1 C)  TempSrc: Oral  SpO2: 100%  Weight: 81.6 kg  Height: 6' (1.829 m)    Constitutional: NAD, calm, comfortable Vitals:   02/21/24 1114  BP: (!) 149/72  Pulse: 64  Resp: 18  Temp: 98.8 F (37.1 C)  TempSrc: Oral  SpO2: 100%  Weight: 81.6 kg  Height: 6' (1.829 m)   Eyes: PERRL, lids and conjunctivae normal ENMT: Mucous membranes are moist.  Neck: normal, supple, no masses, no thyromegaly Respiratory: clear to auscultation bilaterally, no wheezing, no crackles. Normal respiratory effort. No accessory muscle use.  Cardiovascular: Regular rate and rhythm, systolic murmur heard.   No extremity edema.  Abdomen: no tenderness, no masses palpated. No hepatosplenomegaly. Bowel sounds positive.  Musculoskeletal: no clubbing / cyanosis. Ulcer on the plantar surface of the right foot, covered with dressing Skin: no rashes, lesions, ulcers. No induration Neurologic: CN 2-12 grossly  intact.  Strength 5/5 in all 4.  Psychiatric: Normal judgment and insight. Alert and oriented x 3. Normal mood.   Foley Catheter:None  Labs on Admission: I have personally reviewed following labs and imaging studies  CBC: Recent Labs  Lab 02/21/24 1116  WBC 9.9  NEUTROABS 6.9  HGB 15.5  HCT 46.6  MCV 87.6  PLT 258   Basic Metabolic Panel: Recent Labs  Lab 02/21/24 1116  NA 140  K 3.8  CL 105  CO2 23  GLUCOSE 213*  BUN 23  CREATININE 0.78  CALCIUM  9.6   GFR: Estimated Creatinine Clearance: 99.7 mL/min (by C-G formula based on SCr of 0.78 mg/dL). Liver Function Tests: Recent Labs  Lab 02/21/24 1116  AST 23  ALT 19  ALKPHOS 77  BILITOT 0.8  PROT 7.7  ALBUMIN 4.3   No results for input(s): LIPASE, AMYLASE in the last 168 hours. No results for input(s):  AMMONIA in the last 168 hours. Coagulation Profile: No results for input(s): INR, PROTIME in the last 168 hours. Cardiac Enzymes: No results for input(s): CKTOTAL, CKMB, CKMBINDEX, TROPONINI in the last 168 hours. BNP (last 3 results) No results for input(s): PROBNP in the last 8760 hours. HbA1C: No results for input(s): HGBA1C in the last 72 hours. CBG: No results for input(s): GLUCAP in the last 168 hours. Lipid Profile: No results for input(s): CHOL, HDL, LDLCALC, TRIG, CHOLHDL, LDLDIRECT in the last 72 hours. Thyroid Function Tests: No results for input(s): TSH, T4TOTAL, FREET4, T3FREE, THYROIDAB in the last 72 hours. Anemia Panel: No results for input(s): VITAMINB12, FOLATE, FERRITIN, TIBC, IRON, RETICCTPCT in the last 72 hours. Urine analysis: No results found for: COLORURINE, APPEARANCEUR, LABSPEC, PHURINE, GLUCOSEU, HGBUR, BILIRUBINUR, KETONESUR, PROTEINUR, UROBILINOGEN, NITRITE, LEUKOCYTESUR  Radiological Exams on Admission: No results found.   Assessment/Plan Principal Problem:   Cellulitis of foot,  right Active Problems:   Cellulitis of right lower extremity   Type 2 diabetes mellitus with diabetic foot ulcer (HCC)   Diabetic right foot wound: Follows with podiatry.  X-ray of the right foot done as an outpatient showed osteomyelitis of the 2nd MTPJ . Recommended by podiatry to be admitted for right foot surgery.  No signs of sepsis, no leukocytosis or fever. Started on broad-spectrum antibiotics: vancomycin  and Zosyn .  Continue pain management and supportive care   Diabetes type 2: Takes insulin  and oral medication at home.  Monitor blood sugars.  Recent A1c of 6.5 as per 11/29/2023.  Continue sliding scale insulin  for now   Hypertension: Blood pressure stable.  Resumed home antihypertensives   HLD: Continue crestor    Hypothyroidism: On sythyroid         Severity of Illness: The appropriate patient status for this patient is INPATIENT. Inpatient status is judged to be reasonable and necessary in order to provide the required intensity of service to ensure the patient's safety. The patient's presenting symptoms, physical exam findings, and initial radiographic and laboratory data in the context of their chronic comorbidities is felt to place them at high risk for further clinical deterioration. Furthermore, it is not anticipated that the patient will be medically stable for discharge from the hospital within 2 midnights of admission.   * I certify that at the point of admission it is my clinical judgment that the patient will require inpatient hospital care spanning beyond 2 midnights from the point of admission due to high intensity of service, high risk for further deterioration and high frequency of surveillance required.*   DVT prophylaxis: Lovenox  Code Status: Full Family Communication: None at bedside Consults called: Podiatry     Ivonne Mustache MD [facility]  02/21/2024, 1:05 PM

## 2024-02-21 NOTE — Progress Notes (Signed)
 Subjective/Chief Complaint: Patient seen resting comfortably in bed.  No specific complaints.   Objective: Vital signs in last 24 hours: Temp:  [97.6 F (36.4 C)-98.8 F (37.1 C)] 97.6 F (36.4 C) (08/04 1402) Pulse Rate:  [64-65] 65 (08/04 1402) Resp:  [18] 18 (08/04 1402) BP: (139-149)/(72-84) 139/84 (08/04 1402) SpO2:  [100 %] 100 % (08/04 1402) Weight:  [81.6 kg] 81.6 kg (08/04 1114) Last BM Date : 02/21/24  Intake/Output from previous day: No intake/output data recorded. Intake/Output this shift: No intake/output data recorded.  Bandage on the right foot is intact.  Left in place.  Previous examination earlier today in my office revealed a full-thickness ulceration with exposed bone beneath the right second metatarsal head area.  Second toe is dorsally dislocated onto the second metatarsal head.  X-rays taken in the office revealed erosive changes at the plantar head of the second metatarsal consistent with osteomyelitis.  Significant neuropathy.  Pulses are intact.  Lab Results:  Recent Labs    02/21/24 1116  WBC 9.9  HGB 15.5  HCT 46.6  PLT 258   BMET Recent Labs    02/21/24 1116  NA 140  K 3.8  CL 105  CO2 23  GLUCOSE 213*  BUN 23  CREATININE 0.78  CALCIUM  9.6   PT/INR No results for input(s): LABPROT, INR in the last 72 hours. ABG No results for input(s): PHART, HCO3 in the last 72 hours.  Invalid input(s): PCO2, PO2  Studies/Results: No results found.  Anti-infectives: Anti-infectives (From admission, onward)    Start     Dose/Rate Route Frequency Ordered Stop   02/22/24 0300  vancomycin  (VANCOREADY) IVPB 1250 mg/250 mL        1,250 mg 166.7 mL/hr over 90 Minutes Intravenous Every 12 hours 02/21/24 1440     02/21/24 1400  vancomycin  (VANCOREADY) IVPB 2000 mg/400 mL        2,000 mg 200 mL/hr over 120 Minutes Intravenous  Once 02/21/24 1315     02/21/24 1330  piperacillin -tazobactam (ZOSYN ) IVPB 3.375 g        3.375 g 12.5  mL/hr over 240 Minutes Intravenous Every 8 hours 02/21/24 1315     02/21/24 1300  vancomycin  (VANCOCIN ) IVPB 1000 mg/200 mL premix  Status:  Discontinued        1,000 mg 200 mL/hr over 60 Minutes Intravenous  Once 02/21/24 1247 02/21/24 1302   02/21/24 1300  piperacillin -tazobactam (ZOSYN ) IVPB 3.375 g  Status:  Discontinued        3.375 g 100 mL/hr over 30 Minutes Intravenous  Once 02/21/24 1247 02/21/24 1302       Assessment/Plan: s/p Procedure(s): AMPUTATION, FOOT, RAY (Right) Assessment: 1.  Full-thickness ulceration right forefoot with osteomyelitis second metatarsal. 2.  Diabetes with associated neuropathy.  Plan: Discussed with the patient the need for amputation with partial ray resection of the right second.  Discussed the procedure and recovery time involved.  Discussed possible risk and complications of the procedure including but not limited to inability of the wound to heal due to his diabetes, continued infection, vascular status.  Also could have issues with transfer lesions and ulcerations in the future which could require further amputation.  No guarantees could be given as to the outcome of surgery.  Questions invited and answered.  We will obtain consent for amputation right second toe with partial ray resection to be performed by Dr. Tanda tomorrow.  Patient is n.p.o. after midnight.  Plan for surgery tomorrow around noon  LOS: 0 days    Krystal LELON Rosella 02/21/2024

## 2024-02-22 ENCOUNTER — Inpatient Hospital Stay: Admitting: Anesthesiology

## 2024-02-22 ENCOUNTER — Encounter
Admission: EM | Disposition: A | Discharge status: Home / Self Care | Payer: Self-pay | Source: Ambulatory Visit | Attending: Internal Medicine

## 2024-02-22 ENCOUNTER — Inpatient Hospital Stay

## 2024-02-22 ENCOUNTER — Encounter: Payer: Self-pay | Admitting: Internal Medicine

## 2024-02-22 ENCOUNTER — Other Ambulatory Visit: Payer: Self-pay

## 2024-02-22 DIAGNOSIS — M86171 Other acute osteomyelitis, right ankle and foot: Secondary | ICD-10-CM

## 2024-02-22 DIAGNOSIS — L03115 Cellulitis of right lower limb: Secondary | ICD-10-CM | POA: Diagnosis not present

## 2024-02-22 HISTORY — PX: AMPUTATION: SHX166

## 2024-02-22 LAB — CBC
HCT: 42.5 % (ref 39.0–52.0)
Hemoglobin: 14.3 g/dL (ref 13.0–17.0)
MCH: 29.5 pg (ref 26.0–34.0)
MCHC: 33.6 g/dL (ref 30.0–36.0)
MCV: 87.6 fL (ref 80.0–100.0)
Platelets: 235 K/uL (ref 150–400)
RBC: 4.85 MIL/uL (ref 4.22–5.81)
RDW: 13.6 % (ref 11.5–15.5)
WBC: 9.4 K/uL (ref 4.0–10.5)
nRBC: 0 % (ref 0.0–0.2)

## 2024-02-22 LAB — BASIC METABOLIC PANEL WITH GFR
Anion gap: 7 (ref 5–15)
BUN: 21 mg/dL (ref 8–23)
CO2: 27 mmol/L (ref 22–32)
Calcium: 9.2 mg/dL (ref 8.9–10.3)
Chloride: 106 mmol/L (ref 98–111)
Creatinine, Ser: 0.95 mg/dL (ref 0.61–1.24)
GFR, Estimated: >60 mL/min (ref >60)
Glucose, Bld: 130 mg/dL — ABNORMAL HIGH (ref 70–99)
Potassium: 3.6 mmol/L (ref 3.5–5.1)
Sodium: 140 mmol/L (ref 135–145)

## 2024-02-22 LAB — GLUCOSE, CAPILLARY
Glucose-Capillary: 106 mg/dL — ABNORMAL HIGH (ref 70–99)
Glucose-Capillary: 110 mg/dL — ABNORMAL HIGH (ref 70–99)
Glucose-Capillary: 204 mg/dL — ABNORMAL HIGH (ref 70–99)
Glucose-Capillary: 111 mg/dL — ABNORMAL HIGH (ref 70–99)
Glucose-Capillary: 289 mg/dL — ABNORMAL HIGH (ref 70–99)
Glucose-Capillary: 314 mg/dL — ABNORMAL HIGH (ref 70–99)

## 2024-02-22 LAB — HEMOGLOBIN A1C
Hgb A1c MFr Bld: 7.3 % — ABNORMAL HIGH (ref 4.8–5.6)
Mean Plasma Glucose: 163 mg/dL

## 2024-02-22 SURGERY — AMPUTATION, FOOT, RAY
Anesthesia: General | Laterality: Right

## 2024-02-22 MED ORDER — LIDOCAINE HCL (PF) 1 % IJ SOLN
INTRAMUSCULAR | Status: AC
  Filled 2024-02-22: qty 30

## 2024-02-22 MED ORDER — VANCOMYCIN HCL IN DEXTROSE 1-5 GM/200ML-% IV SOLN
1000.0000 mg | Freq: Two times a day (BID) | INTRAVENOUS | Status: DC
  Administered 2024-02-22 – 2024-02-25 (×6): 1000 mg via INTRAVENOUS
  Filled 2024-02-22 (×8): qty 200

## 2024-02-22 MED ORDER — SODIUM CHLORIDE 0.9 % IV SOLN
INTRAVENOUS | Status: DC | PRN
Start: 2024-02-22 — End: 2024-02-22

## 2024-02-22 MED ORDER — PROPOFOL 1000 MG/100ML IV EMUL
INTRAVENOUS | Status: AC
  Filled 2024-02-22: qty 100

## 2024-02-22 MED ORDER — FENTANYL CITRATE (PF) 100 MCG/2ML IJ SOLN
INTRAMUSCULAR | Status: AC
  Filled 2024-02-22: qty 2

## 2024-02-22 MED ORDER — LIDOCAINE HCL (PF) 2 % IJ SOLN
INTRAMUSCULAR | Status: AC
  Filled 2024-02-22: qty 5

## 2024-02-22 MED ORDER — BUPIVACAINE HCL (PF) 0.5 % IJ SOLN
INTRAMUSCULAR | Status: AC
Start: 2024-02-22 — End: 2024-02-22
  Filled 2024-02-22: qty 30

## 2024-02-22 MED ORDER — FENTANYL CITRATE (PF) 100 MCG/2ML IJ SOLN
INTRAMUSCULAR | Status: DC | PRN
  Administered 2024-02-22: 50 ug via INTRAVENOUS

## 2024-02-22 MED ORDER — MIDAZOLAM HCL 2 MG/2ML IJ SOLN
INTRAMUSCULAR | Status: DC | PRN
Start: 2024-02-22 — End: 2024-02-22
  Administered 2024-02-22: 2 mg via INTRAVENOUS

## 2024-02-22 MED ORDER — PIPERACILLIN-TAZOBACTAM 3.375 G IVPB
3.3750 g | Freq: Three times a day (TID) | INTRAVENOUS | Status: DC
  Administered 2024-02-22 – 2024-02-25 (×8): 3.375 g via INTRAVENOUS
  Filled 2024-02-22 (×7): qty 50

## 2024-02-22 MED ORDER — INSULIN ASPART 100 UNIT/ML IJ SOLN
0.0000 [IU] | Freq: Three times a day (TID) | INTRAMUSCULAR | Status: DC
  Administered 2024-02-22: 11 [IU] via SUBCUTANEOUS
  Administered 2024-02-23: 5 [IU] via SUBCUTANEOUS
  Administered 2024-02-23 (×2): 3 [IU] via SUBCUTANEOUS
  Administered 2024-02-24: 2 [IU] via SUBCUTANEOUS
  Administered 2024-02-24: 5 [IU] via SUBCUTANEOUS
  Administered 2024-02-24: 2 [IU] via SUBCUTANEOUS
  Administered 2024-02-24: 5 [IU] via SUBCUTANEOUS
  Administered 2024-02-25: 3 [IU] via SUBCUTANEOUS
  Administered 2024-02-25: 8 [IU] via SUBCUTANEOUS
  Filled 2024-02-22 (×10): qty 1

## 2024-02-22 MED ORDER — KETAMINE HCL 50 MG/5ML IJ SOSY
PREFILLED_SYRINGE | INTRAMUSCULAR | Status: AC
  Filled 2024-02-22: qty 5

## 2024-02-22 MED ORDER — INSULIN GLARGINE-YFGN 100 UNIT/ML ~~LOC~~ SOLN
20.0000 [IU] | Freq: Every day | SUBCUTANEOUS | Status: DC
  Administered 2024-02-23 – 2024-02-25 (×3): 20 [IU] via SUBCUTANEOUS
  Filled 2024-02-22 (×3): qty 0.2

## 2024-02-22 MED ORDER — KETAMINE HCL 50 MG/5ML IJ SOSY
PREFILLED_SYRINGE | INTRAMUSCULAR | Status: DC | PRN
  Administered 2024-02-22: 10 mg via INTRAVENOUS

## 2024-02-22 MED ORDER — BUPIVACAINE HCL 0.5 % IJ SOLN
INTRAMUSCULAR | Status: DC | PRN
  Administered 2024-02-22: 5 mL

## 2024-02-22 MED ORDER — MIDAZOLAM HCL 2 MG/2ML IJ SOLN
INTRAMUSCULAR | Status: AC
  Filled 2024-02-22: qty 2

## 2024-02-22 MED ORDER — LIDOCAINE HCL (PF) 1 % IJ SOLN
INTRAMUSCULAR | Status: DC | PRN
  Administered 2024-02-22: 5 mL

## 2024-02-22 MED ORDER — PROPOFOL 500 MG/50ML IV EMUL
INTRAVENOUS | Status: DC | PRN
  Administered 2024-02-22: 100 ug/kg/min via INTRAVENOUS

## 2024-02-22 MED ORDER — HYDROCODONE-ACETAMINOPHEN 5-325 MG PO TABS
1.0000 | ORAL_TABLET | ORAL | Status: DC | PRN
  Administered 2024-02-22 – 2024-02-23 (×5): 1 via ORAL
  Filled 2024-02-22 (×6): qty 1

## 2024-02-22 MED ORDER — FENTANYL CITRATE (PF) 100 MCG/2ML IJ SOLN: 25.0000 ug | INTRAMUSCULAR | Status: DC | PRN

## 2024-02-22 SURGICAL SUPPLY — 39 items
BLADE SURG MINI STRL (BLADE) IMPLANT
BLADE SW THK.38XMED LNG THN (BLADE) IMPLANT
BNDG COHESIVE 4X5 TAN STRL LF (GAUZE/BANDAGES/DRESSINGS) ×1 IMPLANT
BNDG ELASTIC 4X5.8 VLCR NS LF (GAUZE/BANDAGES/DRESSINGS) ×1 IMPLANT
BNDG ESMARCH 4X12 STRL LF (GAUZE/BANDAGES/DRESSINGS) ×1 IMPLANT
BNDG STRETCH GAUZE 3IN X12FT (GAUZE/BANDAGES/DRESSINGS) ×2 IMPLANT
CNTNR URN SCR LID CUP LEK RST (MISCELLANEOUS) IMPLANT
CUFF TOURN SGL QUICK 18X4 (TOURNIQUET CUFF) IMPLANT
DRSG EMULSION OIL 3X3 NADH (GAUZE/BANDAGES/DRESSINGS) IMPLANT
DURAPREP 26ML APPLICATOR (WOUND CARE) ×1 IMPLANT
ELECTRODE REM PT RTRN 9FT ADLT (ELECTROSURGICAL) ×1 IMPLANT
GAUZE PACKING 1/2INX5YD STRL (GAUZE/BANDAGES/DRESSINGS) IMPLANT
GAUZE PACKING IODOFORM 1/2INX (GAUZE/BANDAGES/DRESSINGS) ×1 IMPLANT
GAUZE SPONGE 4X4 12PLY STRL (GAUZE/BANDAGES/DRESSINGS) ×1 IMPLANT
GAUZE STRETCH 2X75IN STRL (MISCELLANEOUS) IMPLANT
GAUZE XEROFORM 1X8 LF (GAUZE/BANDAGES/DRESSINGS) IMPLANT
GLOVE BIOGEL PI IND STRL 6.5 (GLOVE) ×1 IMPLANT
GLOVE SURG SYN 6.5 PF PI (GLOVE) ×1 IMPLANT
GOWN STRL REUS W/ TWL LRG LVL3 (GOWN DISPOSABLE) ×1 IMPLANT
KIT TURNOVER KIT A (KITS) ×1 IMPLANT
MANIFOLD NEPTUNE II (INSTRUMENTS) ×1 IMPLANT
NDL HYPO 25X1 1.5 SAFETY (NEEDLE) ×1 IMPLANT
NDL SAFETY ECLIPSE 18X1.5 (NEEDLE) ×1 IMPLANT
NEEDLE HYPO 25X1 1.5 SAFETY (NEEDLE) ×1 IMPLANT
NS IRRIG 500ML POUR BTL (IV SOLUTION) ×1 IMPLANT
PACK EXTREMITY ARMC (MISCELLANEOUS) ×1 IMPLANT
PAD ABD DERMACEA PRESS 5X9 (GAUZE/BANDAGES/DRESSINGS) IMPLANT
PENCIL SMOKE EVACUATOR (MISCELLANEOUS) ×1 IMPLANT
SHIELD FULL FACE ANTIFOG 7M (MISCELLANEOUS) ×1 IMPLANT
SOL .9 NS 3000ML IRR UROMATIC (IV SOLUTION) IMPLANT
SOLUTION PREP PVP 2OZ (MISCELLANEOUS) IMPLANT
STOCKINETTE IMPERV 14X48 (MISCELLANEOUS) ×1 IMPLANT
STOCKINETTE M/LG 89821 (MISCELLANEOUS) ×1 IMPLANT
STRAP SAFETY 5IN WIDE (MISCELLANEOUS) ×1 IMPLANT
SUTURE EHLN 3-0 FS-10 30 BLK (SUTURE) IMPLANT
SYR 10ML LL (SYRINGE) ×3 IMPLANT
TIP FAN IRRIG PULSAVAC PLUS (DISPOSABLE) ×1 IMPLANT
TRAP FLUID SMOKE EVACUATOR (MISCELLANEOUS) ×1 IMPLANT
WATER STERILE IRR 500ML POUR (IV SOLUTION) ×1 IMPLANT

## 2024-02-22 NOTE — Plan of Care (Signed)
  Problem: Education: Goal: Ability to describe self-care measures that may prevent or decrease complications (Diabetes Survival Skills Education) will improve Outcome: Progressing   Problem: Coping: Goal: Ability to adjust to condition or change in health will improve Outcome: Progressing   Problem: Metabolic: Goal: Ability to maintain appropriate glucose levels will improve Outcome: Progressing   Problem: Skin Integrity: Goal: Risk for impaired skin integrity will decrease Outcome: Progressing   Problem: Health Behavior/Discharge Planning: Goal: Ability to manage health-related needs will improve Outcome: Progressing

## 2024-02-22 NOTE — Anesthesia Postprocedure Evaluation (Signed)
 Anesthesia Post Note  Patient: Alexander Gill  Procedure(s) Performed: AMPUTATION, FOOT, RAY (Right)  Patient location during evaluation: PACU Anesthesia Type: General Level of consciousness: awake and alert Pain management: pain level controlled Vital Signs Assessment: post-procedure vital signs reviewed and stable Respiratory status: spontaneous breathing, nonlabored ventilation, respiratory function stable and patient connected to nasal cannula oxygen Cardiovascular status: blood pressure returned to baseline and stable Postop Assessment: no apparent nausea or vomiting Anesthetic complications: no   No notable events documented.   Last Vitals:  Vitals:   02/22/24 1331 02/22/24 1358  BP: 123/87 113/65  Pulse: 65 (!) 57  Resp: 13 16  Temp: (!) 36.1 C (!) 36.4 C  SpO2: 98% 100%    Last Pain:  Vitals:   02/22/24 1751  TempSrc:   PainSc: 2                  Lynwood KANDICE Clause

## 2024-02-22 NOTE — Progress Notes (Signed)
 Pharmacy Antibiotic Note  Alexander Gill is a 68 y.o. male admitted on 02/21/2024 with osteomyelitis. Patient presenting from podiatry office visit after Xray showed evidence of osteomyelitis in right foot. PMH significant for T2DM, HTN, HLD, and hypothyroidism. Patient is afebrile with no leukocytosis. Pharmacy has been consulted for vancomycin  and Zosyn  dosing.   8/5: Podiatry has plans to possibly go to OR today for amputation of right foot. Patient remains afebrile with no leukocytosis. Patient's renal function has slightly worsened overnight. Scr increased from 0.78 to 0.95.   Plan: Decrease vancomycin  dose to 1000 mg IV every 12 hours (eAUC 459.5, Scr 0.95, IBW used, Vd 0.72 L/kg) Continue Zosyn  3.375 g IV every 8 hours Monitor renal function, clinical status, culture data, and LOT F/u on post-surgical antibiotics plans post amputation  Height: 6' (182.9 cm) Weight: 81.6 kg (180 lb) IBW/kg (Calculated) : 77.6  Temp (24hrs), Avg:98 F (36.7 C), Min:97.6 F (36.4 C), Max:98.8 F (37.1 C)  Recent Labs  Lab 02/21/24 1116 02/22/24 0310  WBC 9.9 9.4  CREATININE 0.78 0.95  LATICACIDVEN 1.7  --     Estimated Creatinine Clearance: 84 mL/min (by C-G formula based on SCr of 0.95 mg/dL).    No Known Allergies  Antimicrobials this admission: Vancomycin  8/4 >>  Zosyn  8/4 >>   Dose adjustments this admission:  8/5: decreased Vancomycin  to 1000 mg q12h   Microbiology results: 8/4 BCx: NGTD   Thank you for involving pharmacy in this patient's care.   Damien Napoleon, PharmD Clinical Pharmacist 02/22/2024 10:09 AM

## 2024-02-22 NOTE — Op Note (Addendum)
 FOOT AND ANKLE SURGERY  OPERATIVE REPORT  SURGEON: Arthurine Oleary G. Tanda, DPM  ASSISTS: Dr. Krystal Rosella, DPM  PRE-OPERATIVE DIAGNOSIS:  - Osteomyelitis, RIGHT foot   POST-OPERATIVE DIAGNOSIS:  - Osteomyelitis, RIGHT foot   PROCEDURE(S):  - Second digit amputation with second ray partial resection, RIGHT foot   HEMOSTASIS: Controlled without tourniquet  ANESTHESIA: MAC with local block (10cc 1:1 1% lidocaine  and 0.5% marcaine  plain)  ESTIMATED BLOOD LOSS: Minimal    PATHOLOGY/SPECIMEN(S):   ID Type Source Tests Collected by Time Destination  1 : Right Second Toe Tissue PATH Digit amputation SURGICAL PATHOLOGY Tanda [name], DPM 02/22/2024 1215   2 : Right Second Metatasrsal Distal margin Tissue Foot, Right SURGICAL PATHOLOGY Tanda [name], DPM 02/22/2024 1223   3 : Right Second Metatarsal proximalmargin Tissue Foot, Right SURGICAL PATHOLOGY Tanda [name], DPM 02/22/2024 1224   A : Deep tissue right Foot Tissue Foot, Right AEROBIC/ANAEROBIC CULTURE W GRAM STAIN (SURGICAL/DEEP WOUND) Tanda [name], DPM 02/22/2024 1217       PROCEDURE INDICATIONS:  Alexander Gill is a 68 y.o. male with a past medical history significant for DM2 c/b Charcot deformity with history of multiple prior amputations to the RIGHT foot and TMA on the LEFT foot that presented with chronic wound to the plantar 2nd digit RIGHT foot. Despite conservative management and local wound care, wound persisted. With clinical evidence of poor healing capacity given wound extent both medical and surgical treatment options were presented to the patient, of which the patient elected to undergo surgical intervention of via amputation with plans for primary closure. The procedure, alternatives, risks, and limitations in this individual case have been carefully discussed with the patient. All questions have been thoroughly answered and the patient understands the surgery indicated. The patient has requested that this surgical intervention be  undertaken and a consent form was signed.   PROCEDURAL DETAILS:  The patient was brought to the operating room and was transitioned to the operating room table in the supine position. A standard awake time-out was conducted to confirm the correct patient, procedure, side, and site. All team members were in agreement. Anesthesia team initiated MAC and antibiotics from the inpatient floor were confirmed.  The operative extremity was prepped with alcohol, and10ml plain local anesthetic as noted above was administered for local digit/ ray block. An ankle tourniquet was applied and set to but was ultimately not inflated. The extremity was then scrubbed, prepped and draped in the usual aseptic manner.   A full-thickness, modified racquet type incision was made encircling the second digit with dorsal longitudinal arm extending over the level of the metatarsophalangeal joint. This incision was carried directly down to bone.The digit was distracted and sharply disarticulated from the metatarsal head at the level of the metatarsophalangeal joint. Blunt exploration revealed no pockets of fluid, loculations or active infection.  There was noted to be mild necrosis to the plantar tissue that surrounded the second metatarsal head with direct communication from the plantar wound to the bone noted. No tracking was noted aside from the sinus tract associated to the bone from the underlying wound. A wound culture swab was collected from this area for culture and sensitivities. All nonviable necrotic tissue was sharply excised from the surgical site. Nonfunctioning tendons associated with the amputated digit were then tensioned and transected at most proximal point visualized. The associated metatarsal head was inspected and noted to be discolored, with weakened integrity suggestive of gross bony infection; therefore a sagittal saw was  used to resect the metatarsal head. This most distal aspect of bone with suspected  infection was passed to the back table to be labeled and sent for pathology evaluation as a distal margin. The associated proximal potion of bone specimen which was noted to be absent of gross infection was identified and via rongeur - collected to be sent for pathology evaluation as a proximal margin.  The remaining soft tissue and bone appeared to be of healthy, viable quality; absent of gross infection. The plantar wound was then ellipsed out for primary closure - no adjacent soft tissue infection was identified. The surgical site was then irrigated with 3L NS pulse lavage.  The surgical sites were then closed using 3-0 Nylon.  A dry sterile dressing was applied consisting of Betadine soaked Adaptic, 4 x 4 gauze, Kerlix and an ACE bandage.   Overall, the patient tolerated the procedure and anesthesia well. They were transported to the recovery room with vital signs stable and vascular status intact to the operative foot. Once the patient receives Xrays and is deemed appropriate by the recovery room staff, the patient will be transferred back to the floor to resume inpatient care.   POST-OPERATIVE PLAN:  Diet: May advance as tolerated to baseline diet.  Activity: Heel weight bearing on the operative extremity for transfer. Limit excessive ambulation and dependency to the operative extremity. DVT ppx: Per hospital protocol. . ABX: ABX tailoring and course pending wound culture and specimen evaluation (collected intraoperatively).

## 2024-02-22 NOTE — Brief Op Note (Signed)
 02/22/2024  1:21 PM  PATIENT:  Zackarey Holleman  68 y.o. male  PRE-OPERATIVE DIAGNOSIS:  Osteomyelitis  POST-OPERATIVE DIAGNOSIS:  Osteomyelitis  PROCEDURE:  Procedure(s): AMPUTATION, FOOT, RAY (Right)  SURGEON:  Surgeons and Role:    DEWAINE Tanda Greig KANDICE, DPM - Primary    * Neill Boas, DPM - Assisting  PHYSICIAN ASSISTANT:   ASSISTANTS: Neill Boas, DPM - Assisting  ANESTHESIA:   MAC  EBL:  5 mL   BLOOD ADMINISTERED:none  DRAINS: none   LOCAL MEDICATIONS USED:  OTHER 1:1 0.5% marcaine  plain & 1% lidocaine  plain   SPECIMEN:  Source of Specimen:  bone  DISPOSITION OF SPECIMEN:  PATHOLOGY  COUNTS:  YES  TOURNIQUET:  * Missing tourniquet times found for documented tourniquets in log: [id number] *  DICTATION: .Note written in EPIC  PLAN OF CARE: Admit to inpatient   PATIENT DISPOSITION:  PACU - hemodynamically stable.   Delay start of Pharmacological VTE agent (>24hrs) due to surgical blood loss or risk of bleeding: not applicable

## 2024-02-22 NOTE — Progress Notes (Signed)
   02/22/24 1358  Vitals  Temp (!) 97.5 F (36.4 C)  Temp Source Oral  BP 113/65  MAP (mmHg) 81  BP Location Left Arm  BP Method Automatic  Patient Position (if appropriate) Lying  Pulse Rate (!) 57  Pulse Rate Source Monitor  Resp 16  MEWS COLOR  MEWS Score Color Green  Oxygen Therapy  SpO2 100 %  O2 Device Room Air  MEWS Score  MEWS Temp 0  MEWS Systolic 0  MEWS Pulse 0  MEWS RR 0  MEWS LOC 0  MEWS Score 0   Patient back from PACU alert, on room air, with no complaints of pain.  Right foot dressing in place with no drainage; safety precautions in place.  Plan of care continues.

## 2024-02-22 NOTE — Progress Notes (Signed)
 PROGRESS NOTE  Alexander Gill    DOB: 1955/07/26, 68 y.o.  FMW:[id number]    Code Status: Full Code   DOA: 02/21/2024   LOS: 1   Brief hospital course  Alexander Gill is a 68 y.o. male with medical history significant of diabetes type 2, hypertension, hyperlipidemia, hypothyroidism who presented to the emergency department as recommended by his podiatrist for the evaluation of nonhealing ulcer on his right foot. 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 ED Course: Patient remains hemodynamically stable. Started on IV antibiotics.  Podiatry is consulted and planning partial R foot amputation 8/5.  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 Adjust as indicated after surgical intervention.  - Continue pain management and supportive care - podiatry consulted, Dr Neill, scheduled for partial ray resection today - PT post-op   Diabetes type 2: Recent A1c of 6.5 as per 11/29/2023.   - Continue sliding scale insulin  for now   Hypertension: Blood pressure stable.  Resumed home antihypertensives   HLD: Continue crestor    Hypothyroidism: On sythyroid  Body mass index is 24.41 kg/m.  VTE ppx: enoxaparin  (LOVENOX ) injection 40 mg Start: 02/21/24 2200  Diet:     Diet   Diet NPO time specified Except for: Sips with Meds   Consultants: Podiatry   Subjective 02/22/24    Pt reports feeling well today. Denies any pain in his foot. He is awaiting surgery   Objective  Blood pressure 106/63, pulse 93, temperature 98.2 F (36.8 C), temperature source Oral, resp. rate 16, height 6' (1.829 m), weight 81.6 kg, SpO2 98%.  Intake/Output Summary (Last 24 hours) at 02/22/2024 0754 Last data filed at 02/22/2024 0400 Gross per  24 hour  Intake 384.44 ml  Output --  Net 384.44 ml   Filed Weights   02/21/24 1114  Weight: 81.6 kg     Physical Exam:  General: awake, alert, NAD Respiratory: normal respiratory effort. Nervous: A&O x3. no gross focal neurologic deficits, normal speech Extremities: distal R foot with poor circulation. Warm extremities. Ulcer on R foot without drainage or bleeding. No tenderness to palpation Skin: dry, intact, normal temperature, normal color. No rashes, lesions or ulcers on exposed skin Psychiatry: normal mood, congruent affect  Labs   I have personally reviewed the following labs and imaging studies CBC    Component Value Date/Time   WBC 9.4 02/22/2024 0310   RBC 4.85 02/22/2024 0310   HGB 14.3 02/22/2024 0310   HGB 15.1 10/01/2023 0815   HCT 42.5 02/22/2024 0310   HCT 45.9 10/01/2023 0815   PLT 235 02/22/2024 0310   PLT 231 10/01/2023 0815   MCV 87.6 02/22/2024 0310   MCV 90 10/01/2023 0815   MCH 29.5 02/22/2024 0310   MCHC 33.6 02/22/2024 0310   RDW 13.6 02/22/2024 0310   RDW 13.2 10/01/2023 0815   LYMPHSABS 1.6 02/21/2024 1116   MONOABS 0.8 02/21/2024 1116   EOSABS 0.5 02/21/2024 1116   BASOSABS 0.1 02/21/2024 1116      Latest Ref Rng & Units 02/22/2024    3:10 AM 02/21/2024   11:16 AM 10/01/2023    8:15 AM  BMP  Glucose 70 - 99 mg/dL 869  786  774   BUN  8 - 23 mg/dL 21  23  19    Creatinine 0.61 - 1.24 mg/dL 9.04  9.21  9.06   BUN/Creat Ratio 10 - 24   20   Sodium 135 - 145 mmol/L 140  140  142   Potassium 3.5 - 5.1 mmol/L 3.6  3.8  4.0   Chloride 98 - 111 mmol/L 106  105  102   CO2 22 - 32 mmol/L 27  23  23    Calcium  8.9 - 10.3 mg/dL 9.2  9.6  9.5    No results found.  Disposition Plan & Communication  Patient status: Inpatient  Admitted From: Home Planned disposition location: Home Anticipated discharge date: 8/7 pending surgical recovery   Family Communication: none     Author: Marien LITTIE Piety, DO [facility] 02/22/2024, 7:54 AM    Available by Epic secure chat 7AM-7PM. If 7PM-7AM, please contact night-coverage.  [facility] contact information found on [email or web address].

## 2024-02-22 NOTE — Anesthesia Preprocedure Evaluation (Signed)
 Anesthesia Evaluation  Patient identified by MRN, date of birth, ID band Patient awake    Reviewed: Allergy & Precautions, NPO status , Patient's Chart, lab work & pertinent test results  History of Anesthesia Complications Negative for: history of anesthetic complications  Airway Mallampati: III  TM Distance: >3 FB Neck ROM: Full    Dental  (+) Dental Advidsory Given   Pulmonary neg pulmonary ROS, neg shortness of breath, neg sleep apnea, neg COPD   Pulmonary exam normal breath sounds clear to auscultation       Cardiovascular Exercise Tolerance: Good METS: 3 - Mets hypertension, Pt. on medications and Pt. on home beta blockers (-) angina (-) Past MI negative cardio ROS Normal cardiovascular exam(-) dysrhythmias (-) Valvular Problems/Murmurs Rhythm:Regular Rate:Normal     Neuro/Psych negative neurological ROS  negative psych ROS   GI/Hepatic negative GI ROS, Neg liver ROS,,,  Endo/Other  diabetes, Poorly Controlled, Insulin  DependentHypothyroidism    Renal/GU negative Renal ROS  negative genitourinary   Musculoskeletal negative musculoskeletal ROS (+)    Abdominal   Peds  Hematology negative hematology ROS (+)   Anesthesia Other Findings Past Medical History: No date: Cataract     Comment:  bilateral eyes No date: Diabetes mellitus without complication (HCC) No date: Heart abnormality     Comment:  Enlarged left ventricle No date: Hypertension   Reproductive/Obstetrics negative OB ROS                              Anesthesia Physical Anesthesia Plan  ASA: 3  Anesthesia Plan: General   Post-op Pain Management:    Induction: Intravenous  PONV Risk Score and Plan: 2 and Propofol  infusion, TIVA and Treatment may vary due to age or medical condition  Airway Management Planned: Natural Airway and Simple Face Mask  Additional Equipment:   Intra-op Plan:   Post-operative  Plan:   Informed Consent: I have reviewed the patients History and Physical, chart, labs and discussed the procedure including the risks, benefits and alternatives for the proposed anesthesia with the patient or authorized representative who has indicated his/her understanding and acceptance.     Dental Advisory Given  Plan Discussed with: Anesthesiologist, CRNA and Surgeon  Anesthesia Plan Comments: (Patient consented for risks of anesthesia including but not limited to:  - adverse reactions to medications - damage to eyes, teeth, lips or other oral mucosa - nerve damage due to positioning  - sore throat or hoarseness - Damage to heart, brain, nerves, lungs, other parts of body or loss of life  Patient voiced understanding.)         Anesthesia Quick Evaluation

## 2024-02-22 NOTE — Interval H&P Note (Signed)
 History and Physical Interval Note:     02/22/2024 11:41 AM  Alexander Gill  has presented today for surgery, with the diagnosis of Osteomyelitis RIGHT FOOT.  The various methods of treatment have been discussed with the patient and family. After consideration of risks, benefits and other options for treatment, the patient has consented to  Procedure(s): AMPUTATION, FOOT, RAY (Right) as a surgical intervention.  The patient's history has been reviewed, patient examined, no change in status, stable for surgery.  I have reviewed the patient's chart and labs.  Questions were answered to the patient's satisfaction.     Saphyra Hutt KANDICE Blush

## 2024-02-22 NOTE — Transfer of Care (Signed)
 Immediate Anesthesia Transfer of Care Note  Patient: Alexander Gill  Procedure(s) Performed: AMPUTATION, FOOT, RAY (Right)  Patient Location: PACU  Anesthesia Type:MAC  Level of Consciousness: awake, alert , and oriented  Airway & Oxygen Therapy: Patient Spontanous Breathing and Patient connected to face mask oxygen  Post-op Assessment: Report given to RN and Post -op Vital signs reviewed and stable  Post vital signs: stable  Last Vitals:  Vitals Value Taken Time  BP 109/76 02/22/24 13:15  Temp    Pulse 60 02/22/24 13:15  Resp 13 02/22/24 13:15  SpO2 98 % 02/22/24 13:15  Vitals shown include unfiled device data.  Last Pain:  Vitals:   02/22/24 1139  TempSrc:   PainSc: 1          Complications: No notable events documented.

## 2024-02-23 DIAGNOSIS — L03115 Cellulitis of right lower limb: Secondary | ICD-10-CM | POA: Diagnosis not present

## 2024-02-23 LAB — GLUCOSE, CAPILLARY
Glucose-Capillary: 112 mg/dL — ABNORMAL HIGH (ref 70–99)
Glucose-Capillary: 220 mg/dL — ABNORMAL HIGH (ref 70–99)
Glucose-Capillary: 195 mg/dL — ABNORMAL HIGH (ref 70–99)
Glucose-Capillary: 151 mg/dL — ABNORMAL HIGH (ref 70–99)

## 2024-02-23 LAB — CREATININE, SERUM
Creatinine, Ser: 0.89 mg/dL (ref 0.61–1.24)
GFR, Estimated: >60 mL/min (ref >60)

## 2024-02-23 NOTE — Progress Notes (Signed)
 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 S/p amputation on 8/5 as per podiatry. Wound cx is NGTD. Heel weightbearing as per podiatry   DM2: well controlled, HbA1c 6.5. Continue on SSI w/ accuchecks   HTN: continue on losartan , HCTZ   HLD: continue on statin   Hypothyroidism: continue on home dose of levothyroxine         DVT prophylaxis: lovenox  Code Status: (Full Family Communication:  Disposition Plan: likely d/c back home  Level of care: Med-Surg  Status is: Inpatient Remains inpatient appropriate because: severity of illness    Consultants:  Podiatry  Procedures:   Antimicrobials: zosyn , vanco   Subjective: Pt c/o foot pain  Objective: Vitals:   02/22/24 2011 02/22/24 2340 02/23/24 0351 02/23/24 0810  BP: 119/71 111/67 101/65 127/83  Pulse: (!) 59 60 (!) 56 (!) 59  Resp: 18 18 18 16   Temp: 98.5 F (36.9 C) 98 F (36.7 C) 98 F (36.7 C) 98.2 F (36.8 C)  TempSrc: Oral Oral Oral   SpO2: 97% 96% 96% 98%  Weight:      Height:        Intake/Output Summary (Last 24 hours) at 02/23/2024 1007 Last data filed at 02/23/2024 0400 Gross per 24 hour  Intake 1538.44 ml  Output 1905 ml  Net -366.56 ml   Filed Weights   02/21/24 1114  Weight: 81.6 kg    Examination:  General exam: Appears calm and comfortable  Respiratory system: Clear to auscultation. Respiratory effort normal. Cardiovascular system: S1 & S2+. No  rubs, gallops or clicks. Gastrointestinal system: Abdomen is nondistended, soft and nontender. Normal bowel sounds heard. Central nervous system: Alert and oriented. Moves all  extremities Psychiatry: Judgement and insight appear normal. Mood & affect appropriate.     Data Reviewed: I have personally reviewed following labs and imaging studies  CBC: Recent Labs  Lab 02/21/24 1116 02/22/24 0310  WBC 9.9 9.4  NEUTROABS 6.9  --   HGB 15.5 14.3  HCT 46.6 42.5  MCV 87.6 87.6  PLT 258 235   Basic Metabolic Panel: Recent Labs  Lab 02/21/24 1116 02/22/24 0310  NA 140 140  K 3.8 3.6  CL 105 106  CO2 23 27  GLUCOSE 213* 130*  BUN 23 21  CREATININE 0.78 0.95  CALCIUM  9.6 9.2   GFR: Estimated Creatinine Clearance: 84 mL/min (by C-G formula based on SCr of 0.95 mg/dL). Liver Function Tests: Recent Labs  Lab 02/21/24 1116  AST 23  ALT 19  ALKPHOS 77  BILITOT 0.8  PROT 7.7  ALBUMIN 4.3   No results for input(s): LIPASE, AMYLASE in the last 168 hours. No results for input(s): AMMONIA in the last 168 hours. Coagulation Profile: No results for input(s): INR, PROTIME in the last 168 hours. Cardiac Enzymes: No results for input(s): CKTOTAL, CKMB, CKMBINDEX, TROPONINI in the last 168 hours. BNP (last 3 results) No results for input(s): PROBNP in the last 8760 hours. HbA1C: Recent Labs    02/21/24 1420  HGBA1C 7.3*   CBG: Recent Labs  Lab 02/22/24 1146 02/22/24 1313 02/22/24 1640 02/22/24 2013 02/23/24 0632  GLUCAP 204*  111* 289* 314* 112*   Lipid Profile: No results for input(s): CHOL, HDL, LDLCALC, TRIG, CHOLHDL, LDLDIRECT in the last 72 hours. Thyroid Function Tests: No results for input(s): TSH, T4TOTAL, FREET4, T3FREE, THYROIDAB in the last 72 hours. Anemia Panel: No results for input(s): VITAMINB12, FOLATE, FERRITIN, TIBC, IRON, RETICCTPCT in the last 72 hours. Sepsis Labs: Recent Labs  Lab 02/21/24 1116  LATICACIDVEN 1.7    Recent Results (from the past 240 hours)  Culture, blood (Routine X 2) w Reflex to ID Panel     Status: None (Preliminary result)   Collection  Time: 02/21/24  2:20 PM   Specimen: BLOOD  Result Value Ref Range Status   Specimen Description BLOOD RIGHT ARM  Final   Special Requests   Final    BOTTLES DRAWN AEROBIC AND ANAEROBIC Blood Culture adequate volume   Culture   Final    NO GROWTH 2 DAYS Performed at [facility], [address], [place], [place] [address]    Report Status PENDING  Incomplete  Culture, blood (Routine X 2) w Reflex to ID Panel     Status: None (Preliminary result)   Collection Time: 02/21/24  2:27 PM   Specimen: BLOOD  Result Value Ref Range Status   Specimen Description BLOOD LEFT ARM  Final   Special Requests   Final    BOTTLES DRAWN AEROBIC AND ANAEROBIC Blood Culture adequate volume   Culture   Final    NO GROWTH 2 DAYS Performed at [facility], [address], [place], [place] [address]    Report Status PENDING  Incomplete  Aerobic/Anaerobic Culture w Gram Stain (surgical/deep wound)     Status: None (Preliminary result)   Collection Time: 02/22/24 12:17 PM   Specimen: Foot, Right; Tissue  Result Value Ref Range Status   Specimen Description   Final    WOUND Performed at [facility], [address] [place], [place] [address]    Special Requests   Final    RF Performed at [facility], [address] [place], [place] [address]    Gram Stain NO WBC SEEN NO ORGANISMS SEEN   Final   Culture   Final    NO GROWTH < 12 HOURS Performed at [facility], [address], [place], [place] [address]    Report Status PENDING  Incomplete         Radiology Studies: DG Foot Complete Right Result Date: 02/22/2024 EXAM: 3 or more VIEW(S) XRAY OF THE RIGHT FOOT 02/22/2024 02:10:00 PM COMPARISON: 11/20/2022 CLINICAL HISTORY: [id number] Post-operative state 252351. FINDINGS: Transmetatarsal amputation of the second ray. Previous resection of the fourth toe. Previous distal third metatarsal resection. 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 Expected postoperative  changes in the second metatarsal bed. IMPRESSION: 1. Interval transmetatarsal amputation of the second ray. 2. 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 Electronically signed by: Andrea Gasman MD 02/22/2024 02:53 PM EDT RP Workstation: [id number]        Scheduled Meds:  ascorbic acid   250 mg Oral BID   aspirin  EC  81 mg Oral Daily   cholecalciferol   1,000 Units Oral QPM   enoxaparin  (LOVENOX ) injection  40 mg Subcutaneous Q24H   losartan   100 mg Oral Daily   And   hydrochlorothiazide   25 mg Oral Daily   insulin  aspart  0-15 Units Subcutaneous TID AC & HS   insulin  glargine-yfgn  20 Units Subcutaneous Daily   levothyroxine   25 mcg Oral Q0600   rosuvastatin   20 mg Oral QPM   Continuous Infusions:  piperacillin -tazobactam (ZOSYN )  IV 3.375 g (02/23/24 0542)   vancomycin  1,000 mg (02/23/24 0339)     LOS: 2 days      Anthony CHRISTELLA Pouch, MD [facility] Pager [phone]  If 7PM-7AM, please contact night-coverage [email or web address] 02/23/2024, 10:07 AM

## 2024-02-23 NOTE — Plan of Care (Signed)
  Problem: Education: Goal: Ability to describe self-care measures that may prevent or decrease complications (Diabetes Survival Skills Education) will improve Outcome: Progressing   Problem: Coping: Goal: Ability to adjust to condition or change in health will improve Outcome: Progressing   Problem: Metabolic: Goal: Ability to maintain appropriate glucose levels will improve Outcome: Progressing   Problem: Skin Integrity: Goal: Risk for impaired skin integrity will decrease Outcome: Progressing   Problem: Tissue Perfusion: Goal: Adequacy of tissue perfusion will improve Outcome: Progressing

## 2024-02-23 NOTE — Evaluation (Signed)
 Physical Therapy Evaluation Patient Details Name: Alexander Gill MRN: [id number] DOB: 1955/11/25 Today's Date: 02/23/2024  History of Present Illness  68 y.o. male with a past medical history significant for DM2 c/b Charcot deformity with history of multiple prior amputations to the RIGHT foot and TMA on the LEFT foot that presented with chronic wound to the plantar 2nd digit RIGHT foot.  Is now s/p 2nd toe amputation  Clinical Impression  Pt showed good overall effort and understanding of need and importance for heel only WBing for healing (pt has had multiple amputations in the past).  He showed ability to move well within [facility] considerations and genuinely did well.  Minimal PT needs, will maintain on caseload to maximize safe mobility while here in preparation for transition home.        If plan is discharge home, recommend the following: Assist for transportation;Assistance with cooking/housework   Can travel by private vehicle        Equipment Recommendations Other (comment) (has equip)  Recommendations for Other Services       Functional Status Assessment Patient has had a recent decline in their functional status and demonstrates the ability to make significant improvements in function in a reasonable and predictable amount of time.     Precautions / Restrictions Precautions Precautions: Fall (low) Required Braces or Orthoses:  (R post-op shoe) Restrictions Weight Bearing Restrictions Per Provider Order: Yes RLE Weight Bearing Per Provider Order: Touchdown weight bearing Other Position/Activity Restrictions: heel only WBing with postop shoe donned      Mobility  Bed Mobility Overal bed mobility: Modified Independent             General bed mobility comments: easily gets up to sitting and dones post-op shoe sitting EOB    Transfers Overall transfer level: Modified independent Equipment used: Rolling walker (2 wheels)               General transfer comment:  cuing for appropriate UE use and R foot protection, but pt has been through multiple prior amputations and familiar to limitations    Ambulation/Gait Ambulation/Gait assistance: Supervision Gait Distance (Feet): 30 Feet Assistive device: Rolling walker (2 wheels)         General Gait Details: Limited pt to in-room ambulation with walker (shoe donned) - he was able to maintain NWBing as well as minimal heel only WBing with relative confidence and displayed good safety.  Stairs            Wheelchair Mobility     Tilt Bed    Modified Rankin (Stroke Patients Only)       Balance Overall balance assessment: Independent                                           Pertinent Vitals/Pain Pain Assessment Pain Assessment: 0-10 Pain Score: 3  Pain Location: R foot    Home Living Family/patient expects to be discharged to:: Private residence Living Arrangements: Spouse/significant other Available Help at Discharge: Available 24 hours/day   Home Access:  (no steps, small threshold)       Home Layout: Able to live on main level with bedroom/bathroom Home Equipment: Rolling Walker (2 wheels);Cane - single point;BSC/3in1;Shower seat - built in;Shower seat      Prior Function Prior Level of Function : Independent/Modified Independent  Mobility Comments: Pt able to move well, active and independent w/o AD ADLs Comments: independent     Extremity/Trunk Assessment   Upper Extremity Assessment Upper Extremity Assessment: Overall WFL for tasks assessed    Lower Extremity Assessment Lower Extremity Assessment: Overall WFL for tasks assessed       Communication   Communication Communication: No apparent difficulties Factors Affecting Communication: Hearing impaired    Cognition Arousal: Alert Behavior During Therapy: WFL for tasks assessed/performed   PT - Cognitive impairments: No apparent impairments                          Following commands: Intact       Cueing Cueing Techniques: Verbal cues     General Comments      Exercises     Assessment/Plan    PT Assessment Patient needs continued PT services  PT Problem List Decreased strength;Decreased activity tolerance;Decreased range of motion;Decreased balance;Decreased mobility;Decreased safety awareness;Pain;Decreased knowledge of use of DME       PT Treatment Interventions DME instruction;Gait training;Functional mobility training;Therapeutic activities;Therapeutic exercise;Balance training;Neuromuscular re-education;Cognitive remediation;Patient/family education    PT Goals (Current goals can be found in the Care Plan section)  Acute Rehab PT Goals Patient Stated Goal: go home soon PT Goal Formulation: With patient Time For Goal Achievement: 03/07/24 Potential to Achieve Goals: Good    Frequency Min 2X/week     Co-evaluation               AM-PAC PT 6 Clicks Mobility  Outcome Measure Help needed turning from your back to your side while in a flat bed without using bedrails?: None Help needed moving from lying on your back to sitting on the side of a flat bed without using bedrails?: None Help needed moving to and from a bed to a chair (including a wheelchair)?: None Help needed standing up from a chair using your arms (e.g., wheelchair or bedside chair)?: None Help needed to walk in hospital room?: A Little Help needed climbing 3-5 steps with a railing? : A Little 6 Click Score: 22    End of Session Equipment Utilized During Treatment: Gait belt Activity Tolerance: Patient tolerated treatment well Patient left: in chair;with call bell/phone within reach Nurse Communication: Mobility status PT Visit Diagnosis: Unsteadiness on feet (R26.81);Difficulty in walking, not elsewhere classified (R26.2);Other abnormalities of gait and mobility (R26.89)    Time: 9065-9047 PT Time Calculation (min) (ACUTE ONLY): 18  min   Charges:   PT Evaluation $PT Eval Low Complexity: 1 Low   PT General Charges $$ ACUTE PT VISIT: 1 Visit         [name], DPT 02/23/2024, 11:19 AM

## 2024-02-23 NOTE — Progress Notes (Addendum)
 PODIATRY: PROGRESS NOTE    Surgery:  Procedure(s) (LRB): AMPUTATION, FOOT, RAY (Right)  *Second digit amputation with partial second ray resection, RIGHT foot.  POD:  1 Day Post-Op  O/N: NAEON  Subjective:  Patient resting comfortably at bedside.  Denies F/C/N/V/SOB/CP. Denies acute calf pain.  Would like to know est. Plan for DC.   PHYSICAL EXAMINATION: BP 130/70 (BP Location: Right Arm)   Pulse 61   Temp 98.2 F (36.8 C) (Oral)   Resp 19   Ht 6' (1.829 m)   Wt 81.6 kg   SpO2 98%   BMI 24.41 kg/m ? GEN: NAD. AOX3. ? RESP: Non-labored breathing on RA.? ABD: NT/ND of all four quadrants.? NEURO: Moving all four extremities spontaneously. ? ? FOCUSED LOWER EXTREMITY EXAMINATION:? NEURO: ? - No gross motor deficits noted - LOPS to nerve distributions to foot ? - No paresthesias elicited on examination. ??  VASCULAR: ? - DP/PT 2 - Capillary sluggish - but present ? - Focal edema noted adjacent to surgical site ; as expected for post-operative state. Mild ecchymosis to incision site.   MSK: ? - TTP - none noted on exam - s/p R 2nd, 3rd, 4th digit amputations. ? - No calf tenderness. ?  DERM: ? - Dressings c/I ; mild sanguinous strikethrough not extending to overwrap - ACE. - Surgical site incision(s) intact (dorsal incision and primary closure of plantar ulcer site). ?No clinical signs of dehiscence noted. - No proximal streaking. No malodor. ? - No clinical signs of infection noted. ??      ?? Imaging: ? Study Result  Narrative & Impression  EXAM: 3 or more VIEW(S) XRAY OF THE RIGHT FOOT 02/22/2024 02:10:00 PM   COMPARISON: 11/20/2022   CLINICAL HISTORY: 747648 Post-operative state 252351. FINDINGS:   Transmetatarsal amputation of the second ray. Previous resection of the fourth toe. Previous distal third metatarsal resection. 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 Expected postoperative changes in the second metatarsal bed.   IMPRESSION: 1.  Interval transmetatarsal amputation of the second ray. 2. 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   Electronically signed by: Andrea Gasman MD 02/22/2024 02:53 PM EDT RP Workstation: [id number]    Results for orders placed or performed during the hospital encounter of 02/21/24  Culture, blood (Routine X 2) w Reflex to ID Panel     Status: None (Preliminary result)   Collection Time: 02/21/24  2:20 PM   Specimen: BLOOD  Result Value Ref Range Status   Specimen Description BLOOD RIGHT ARM  Final   Special Requests   Final    BOTTLES DRAWN AEROBIC AND ANAEROBIC Blood Culture adequate volume   Culture   Final    NO GROWTH 2 DAYS Performed at [facility], [address] [place], [place] [address]    Report Status PENDING  Incomplete  Culture, blood (Routine X 2) w Reflex to ID Panel     Status: None (Preliminary result)   Collection Time: 02/21/24  2:27 PM   Specimen: BLOOD  Result Value Ref Range Status   Specimen Description BLOOD LEFT ARM  Final   Special Requests   Final    BOTTLES DRAWN AEROBIC AND ANAEROBIC Blood Culture adequate volume   Culture   Final    NO GROWTH 2 DAYS Performed at [facility], [address] [place], [place] [address]    Report Status PENDING  Incomplete  Aerobic/Anaerobic Culture w Gram Stain (surgical/deep wound)     Status: None (Preliminary result)   Collection Time:  02/22/24 12:17 PM   Specimen: Foot, Right; Tissue  Result Value Ref Range Status   Specimen Description   Final    WOUND Performed at [facility], [address] [place], [place] [address]    Special Requests   Final    RF Performed at [facility], [address] [place], [place] [address]    Gram Stain NO WBC SEEN NO ORGANISMS SEEN   Final   Culture   Final    NO GROWTH < 12 HOURS Performed at [facility], [address] [place], [place] [address]    Report Status PENDING  Incomplete    ID Type Source Tests Collected  by Time Destination  1 : Right Second Toe Tissue PATH Digit amputation SURGICAL PATHOLOGY Tanda [name], DPM 02/22/2024 1215   2 : Right Second Metatasrsal Distal margin Tissue Foot, Right SURGICAL PATHOLOGY Tanda [name], DPM 02/22/2024 1223   3 : Right Second Metatarsal proximalmargin Tissue Foot, Right SURGICAL PATHOLOGY Tanda [name], DPM 02/22/2024 1224   A : Deep tissue right Foot Tissue Foot, Right AEROBIC/ANAEROBIC CULTURE W GRAM STAIN (SURGICAL/DEEP WOUND) Tanda [name], DPM 02/22/2024 1217      ASSESSMENT:?  Alexander Gill is a 68 y.o. male presenting with R DFI and suspected osteomyelitis of the RIGHT foot which he is now POD1 from R 2nd digit amputation with partial ray resection ; doing well.   PLAN:? - Activity: Heel weight bearing to the RIGHT lower extremity for transfer ONLY in post-operative shoe. Full WBAT on the LEFT lower extremity.   - Diet: DM diet  - Wound Care: Podiatry to conduct post-op. Materials: Betadine / Adaptic / Gauze (4x4) / Kerlix / 4 inch ACE bandage Instructions: *Podiatry to conduct post-op. Supplement Reccs: Okay to reinforce with dry gauze / loose ACE overlap prn strikethrough. Please leave underlayer intact.  - ABX: Appreciate assistance with antibiotic stewardship from medicine / pharmacy / ID services.   CURRENT: 02/23/24  - Zosyn  3.375g IV q8h   - Vancomycin  1g IV q12h  - Dispo: Likely pending surgical pathology results for ABX tailoring. *If proximal margin is negative for OM - okay to DC with PO coverage for SSTI for total duration of ABX of 7-10 days.    Maiko Salais G. Tanda, [facility]  [facility] - Podiatry

## 2024-02-24 DIAGNOSIS — L03115 Cellulitis of right lower limb: Secondary | ICD-10-CM | POA: Diagnosis not present

## 2024-02-24 LAB — GLUCOSE, CAPILLARY
Glucose-Capillary: 137 mg/dL — ABNORMAL HIGH (ref 70–99)
Glucose-Capillary: 245 mg/dL — ABNORMAL HIGH (ref 70–99)
Glucose-Capillary: 200 mg/dL — ABNORMAL HIGH (ref 70–99)
Glucose-Capillary: 135 mg/dL — ABNORMAL HIGH (ref 70–99)

## 2024-02-24 NOTE — Progress Notes (Addendum)
 PT Cancellation Note  Patient Details Name: Alexander Gill MRN: [id number] DOB: 1956-05-28   Cancelled Treatment:    Reason Eval/Treat Not Completed: Other (comment) Entered pt's room, he was up in recliner and reports that he has been up to the bathroom and moving confidently.  PT ensured he was being cognizant of heel only WBing and post-op shoe use and he assures me has been good about this.  Pt has had multiple similar surgeries with similar precautions in the past and states I don't want to waste you're time, I'm doing fine.  Will continue to follow pt from a distance and see as appropriate/needs.  [name], DPT 02/24/2024, 1:00 PM

## 2024-02-24 NOTE — Plan of Care (Signed)

## 2024-02-24 NOTE — Plan of Care (Signed)
  Problem: Coping: Goal: Ability to adjust to condition or change in health will improve Outcome: Progressing   Problem: Health Behavior/Discharge Planning: Goal: Ability to manage health-related needs will improve Outcome: Progressing   Problem: Nutritional: Goal: Maintenance of adequate nutrition will improve Outcome: Progressing   Problem: Education: Goal: Knowledge of General Education information will improve Description: Including pain rating scale, medication(s)/side effects and non-pharmacologic comfort measures Outcome: Progressing   Problem: Coping: Goal: Level of anxiety will decrease Outcome: Progressing

## 2024-02-24 NOTE — Progress Notes (Signed)
 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 S/p amputation on 8/5 as per podiatry. Wound cx NGTD. Heel weightbearing as per podiatry   DM2: well controlled, HbA1c 6.5. Continue on SSI w/ accuchecks   HTN: continue on losartan , HCTZ   HLD: continue on statin    Hypothyroidism: continue on home dose of levothyroxine         DVT prophylaxis: lovenox  Code Status: (Full Family Communication:  Disposition Plan: likely d/c back home  Level of care: Med-Surg  Status is: Inpatient Remains inpatient appropriate because: severity of illness, waiting on cx results and/or pathology results to narrow abxs.     Consultants:  Podiatry  Procedures:   Antimicrobials: zosyn , vanco   Subjective: Pt c/o malaise  Objective: Vitals:   02/23/24 1510 02/23/24 1930 02/24/24 0331 02/24/24 0740  BP: 108/67 130/70 110/71 112/76  Pulse: (!) 57 61 (!) 55 64  Resp: 16 19 19 16   Temp: 98 F (36.7 C) 98.2 F (36.8 C) 97.8 F (36.6 C) 97.9 F (36.6 C)  TempSrc: Oral Oral Oral Oral  SpO2:  98% 98% 99%  Weight:      Height:        Intake/Output Summary (Last 24 hours) at 02/24/2024 0944 Last data filed at 02/24/2024 0148 Gross per 24 hour  Intake 558.65 ml  Output --  Net 558.65 ml   Filed Weights   02/21/24 1114  Weight: 81.6 kg    Examination:  General exam: Appears comfortable  Respiratory system: clear breath sounds b/l  Cardiovascular system: S1/S2+. No rubs or clicks  Gastrointestinal system: abd is soft, NT, ND & hypoactive bowel sounds Central nervous system: alert & oriented. Moves all extremities   Psychiatry: judgement and insight appears normal. Appropriate mood and affect    Data Reviewed: I have personally reviewed following labs and imaging studies  CBC: Recent Labs  Lab 02/21/24 1116 02/22/24 0310  WBC 9.9 9.4  NEUTROABS 6.9  --   HGB 15.5 14.3  HCT 46.6 42.5  MCV 87.6 87.6  PLT 258 235   Basic Metabolic Panel: Recent Labs  Lab 02/21/24 1116 02/22/24 0310 02/23/24 1210  NA 140 140  --   K 3.8 3.6  --   CL 105 106  --   CO2 23 27  --   GLUCOSE 213* 130*  --   BUN 23 21  --   CREATININE 0.78 0.95 0.89  CALCIUM  9.6 9.2  --    GFR: Estimated Creatinine Clearance: 89.6 mL/min (by C-G formula based on SCr of 0.89 mg/dL). Liver Function Tests: Recent Labs  Lab 02/21/24 1116  AST 23  ALT 19  ALKPHOS 77  BILITOT 0.8  PROT 7.7  ALBUMIN 4.3   No results for input(s): LIPASE, AMYLASE in the last 168 hours. No results for input(s): AMMONIA in the last 168 hours. Coagulation Profile: No results for input(s): INR, PROTIME in the last 168 hours. Cardiac Enzymes: No results for input(s): CKTOTAL, CKMB, CKMBINDEX, TROPONINI in the last 168 hours. BNP (last 3 results) No results for input(s): PROBNP in the last 8760 hours. HbA1C: Recent Labs  02/21/24 1420  HGBA1C 7.3*   CBG: Recent Labs  Lab 02/23/24 0632 02/23/24 1109 02/23/24 1618 02/23/24 2140 02/24/24 0749  GLUCAP 112* 220* 195* 151* 137*   Lipid Profile: No results for input(s): CHOL, HDL, LDLCALC, TRIG, CHOLHDL, LDLDIRECT in the last 72 hours. Thyroid Function Tests: No results for input(s): TSH, T4TOTAL, FREET4, T3FREE, THYROIDAB in the last 72 hours. Anemia Panel: No results for input(s): VITAMINB12, FOLATE, FERRITIN, TIBC, IRON, RETICCTPCT in the last 72 hours. Sepsis Labs: Recent Labs  Lab 02/21/24 1116  LATICACIDVEN 1.7    Recent Results (from the past 240 hours)  Culture, blood (Routine X 2) w Reflex to ID Panel      Status: None (Preliminary result)   Collection Time: 02/21/24  2:20 PM   Specimen: BLOOD  Result Value Ref Range Status   Specimen Description BLOOD RIGHT ARM  Final   Special Requests   Final    BOTTLES DRAWN AEROBIC AND ANAEROBIC Blood Culture adequate volume   Culture   Final    NO GROWTH 3 DAYS Performed at [facility], [address] [place], [place] [address]    Report Status PENDING  Incomplete  Culture, blood (Routine X 2) w Reflex to ID Panel     Status: None (Preliminary result)   Collection Time: 02/21/24  2:27 PM   Specimen: BLOOD  Result Value Ref Range Status   Specimen Description BLOOD LEFT ARM  Final   Special Requests   Final    BOTTLES DRAWN AEROBIC AND ANAEROBIC Blood Culture adequate volume   Culture   Final    NO GROWTH 3 DAYS Performed at [facility], [address], [place], [place] [address]    Report Status PENDING  Incomplete  Aerobic/Anaerobic Culture w Gram Stain (surgical/deep wound)     Status: None (Preliminary result)   Collection Time: 02/22/24 12:17 PM   Specimen: Foot, Right; Tissue  Result Value Ref Range Status   Specimen Description   Final    WOUND Performed at [facility], [address] [place], [place] [address]    Special Requests   Final    RF Performed at [facility], [address] [place], [place] [address]    Gram Stain NO WBC SEEN NO ORGANISMS SEEN   Final   Culture   Final    CULTURE REINCUBATED FOR BETTER GROWTH Performed at [facility], [address] [place], [place] [address]    Report Status PENDING  Incomplete         Radiology Studies: DG Foot Complete Right Result Date: 02/22/2024 EXAM: 3 or more VIEW(S) XRAY OF THE RIGHT FOOT 02/22/2024 02:10:00 PM COMPARISON: 11/20/2022 CLINICAL HISTORY: 747648 Post-operative state 252351. FINDINGS: Transmetatarsal amputation of the second ray. Previous resection of the fourth toe. Previous distal third metatarsal  resection. 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 Expected postoperative changes in the second metatarsal bed. IMPRESSION: 1. Interval transmetatarsal amputation of the second ray. 2. 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 Electronically signed by: Andrea Gasman MD 02/22/2024 02:53 PM EDT RP Workstation: [id number]        Scheduled Meds:  ascorbic acid   250 mg Oral BID   aspirin  EC  81 mg Oral Daily   cholecalciferol   1,000 Units Oral QPM   enoxaparin  (LOVENOX ) injection  40 mg Subcutaneous Q24H   losartan   100 mg Oral Daily   And   hydrochlorothiazide   25 mg Oral Daily   insulin  aspart  0-15 Units Subcutaneous  TID AC & HS   insulin  glargine-yfgn  20 Units Subcutaneous Daily   levothyroxine   25 mcg Oral Q0600   rosuvastatin   20 mg Oral QPM   Continuous Infusions:  piperacillin -tazobactam (ZOSYN )  IV 3.375 g (02/24/24 0618)   vancomycin  1,000 mg (02/24/24 0504)     LOS: 3 days      Anthony CHRISTELLA Pouch, MD [facility] Pager [phone]  If 7PM-7AM, please contact night-coverage [email or web address] 02/24/2024, 9:44 AM

## 2024-02-24 NOTE — Progress Notes (Signed)
 PODIATRY: PROGRESS NOTE    Surgery:  Procedure(s) (LRB): AMPUTATION, FOOT, RAY (Right)  *Second digit amputation with partial second ray resection, RIGHT foot.  POD:  2 Days Post-Op  O/N: NAEON  Subjective:  Patient resting comfortably in chair at bedside - foot elevated with post op shoe intact. Dressings c/d/I. Feeling good.  Denies F/C/N/V/SOB/CP. Denies acute calf pain.  Would like to know est. Plan for DC - hopes to be DC prior to weekend.   PHYSICAL EXAMINATION: BP 112/76 (BP Location: Right Arm)   Pulse 64   Temp 97.9 F (36.6 C) (Oral)   Resp 16   Ht 6' (1.829 m)   Wt 81.6 kg   SpO2 99%   BMI 24.41 kg/m ? GEN: NAD. AOX3. ? RESP: Non-labored breathing on RA.? ABD: NT/ND of all four quadrants.? NEURO: Moving all four extremities spontaneously. ? ? FOCUSED LOWER EXTREMITY EXAMINATION:? NEURO: ? - No gross motor deficits noted - LOPS to nerve distributions to foot ? - No paresthesias elicited on examination. ??  VASCULAR: ? - DP/PT 2 - Capillary sluggish - but present ? - Dressings intact - when moved, edema has improved but still present ; as expected for post-operative state. Mild ecchymosis to incision site.   MSK: ? - TTP - none noted on exam - s/p R 2nd, 3rd, 4th digit amputations. ? - No calf tenderness. ?  DERM: ? - Dressings c/d/I - ACE. - No proximal streaking. No malodor. ? ?? Imaging: ? Study Result  Narrative & Impression  EXAM: 3 or more VIEW(S) XRAY OF THE RIGHT FOOT 02/22/2024 02:10:00 PM   COMPARISON: 11/20/2022   CLINICAL HISTORY: [id number] Post-operative state 252351. FINDINGS:   Transmetatarsal amputation of the second ray. Previous resection of the fourth toe. Previous distal third metatarsal resection. 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 Expected postoperative changes in the second metatarsal bed.   IMPRESSION: 1. Interval transmetatarsal amputation of the second ray. 2. 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   Electronically  signed by: Alexander Gasman MD 02/22/2024 02:53 PM EDT RP Workstation: [id number]    Results for orders placed or performed during the hospital encounter of 02/21/24  Culture, blood (Routine X 2) w Reflex to ID Panel     Status: None (Preliminary result)   Collection Time: 02/21/24  2:20 PM   Specimen: BLOOD  Result Value Ref Range Status   Specimen Description BLOOD RIGHT ARM  Final   Special Requests   Final    BOTTLES DRAWN AEROBIC AND ANAEROBIC Blood Culture adequate volume   Culture   Final    NO GROWTH 3 DAYS Performed at [facility], [address], [place], [place] [address]    Report Status PENDING  Incomplete  Culture, blood (Routine X 2) w Reflex to ID Panel     Status: None (Preliminary result)   Collection Time: 02/21/24  2:27 PM   Specimen: BLOOD  Result Value Ref Range Status   Specimen Description BLOOD LEFT ARM  Final   Special Requests   Final    BOTTLES DRAWN AEROBIC AND ANAEROBIC Blood Culture adequate volume   Culture   Final    NO GROWTH 3 DAYS Performed at [facility], [address] [place], [place] [address]    Report Status PENDING  Incomplete  Aerobic/Anaerobic Culture w Gram Stain (surgical/deep wound)     Status: None (Preliminary result)   Collection Time: 02/22/24 12:17 PM   Specimen: Foot, Right; Tissue  Result Value Ref Range Status  Specimen Description   Final    WOUND Performed at [facility] [address] [place], [place] [address]    Special Requests   Final    RF Performed at [facility], [address] [place], [place] [address]    Gram Stain NO WBC SEEN NO ORGANISMS SEEN   Final   Culture   Final    CULTURE REINCUBATED FOR BETTER GROWTH Performed at [facility], [address] [place], [place] [address]    Report Status PENDING  Incomplete    ID Type Source Tests Collected by Time Destination  1 : Right Second Toe Tissue PATH Digit amputation SURGICAL PATHOLOGY Gill Gill, Gill Gill 02/22/2024 1215   2 : Right Second Metatasrsal Distal margin Tissue Foot, Right SURGICAL PATHOLOGY Gill Gill, Gill Gill 02/22/2024 1223   3 : Right Second Metatarsal proximalmargin Tissue Foot, Right SURGICAL PATHOLOGY Gill Gill, Gill Gill 02/22/2024 1224   A : Deep tissue right Foot Tissue Foot, Right AEROBIC/ANAEROBIC CULTURE W GRAM STAIN (SURGICAL/DEEP WOUND) Gill Gill, Gill Gill 02/22/2024 1217      ASSESSMENT:?  Gill Gill is a 68 y.o. male presenting with R DFI and suspected osteomyelitis of the RIGHT foot which he is now POD1 from R 2nd digit amputation with partial ray resection ; doing well.   PLAN:? - Activity: Heel weight bearing to the RIGHT lower extremity for transfer ONLY in post-operative shoe. Full WBAT on the LEFT lower extremity.   - Diet: DM diet  - Wound Care: Podiatry to conduct post-op. Next dressing change for 02/25/24 Materials: Betadine / Adaptic / Gauze (4x4) / Kerlix / 4 inch ACE bandage Instructions: *Podiatry to conduct post-op. Supplement Reccs: Okay to reinforce with dry gauze / loose ACE overlap prn strikethrough. Please leave underlayer intact.  - ABX: Appreciate assistance with antibiotic stewardship from medicine / pharmacy / ID services.   CURRENT: 02/24/24  - Zosyn  3.375g IV q8h   - Vancomycin  1g IV q12h  - Dispo: Likely pending surgical pathology results for ABX tailoring. If DC prior to result return, please DC with PO coverage for total of 7-10 days.   *If proximal margin is negative for OM - okay to DC with PO coverage for SSTI for total duration of ABX of 7-10 days.    Gill Gill, [facility]  [facility] - Podiatry

## 2024-02-25 DIAGNOSIS — Z794 Long term (current) use of insulin: Secondary | ICD-10-CM

## 2024-02-25 DIAGNOSIS — E1140 Type 2 diabetes mellitus with diabetic neuropathy, unspecified: Secondary | ICD-10-CM

## 2024-02-25 DIAGNOSIS — Z89422 Acquired absence of other left toe(s): Secondary | ICD-10-CM

## 2024-02-25 DIAGNOSIS — L03115 Cellulitis of right lower limb: Secondary | ICD-10-CM | POA: Diagnosis not present

## 2024-02-25 DIAGNOSIS — E11628 Type 2 diabetes mellitus with other skin complications: Secondary | ICD-10-CM

## 2024-02-25 LAB — GLUCOSE, CAPILLARY
Glucose-Capillary: 154 mg/dL — ABNORMAL HIGH (ref 70–99)
Glucose-Capillary: 269 mg/dL — ABNORMAL HIGH (ref 70–99)

## 2024-02-25 MED ORDER — AMOXICILLIN-POT CLAVULANATE 875-125 MG PO TABS: 1.0000 | ORAL_TABLET | Freq: Two times a day (BID) | ORAL | 0 refills | Status: AC

## 2024-02-25 NOTE — Discharge Instructions (Addendum)
 Podiatry (Foot and Ankle) Hospital Discharge Instructions:  WOUND CARE / DRESSINGS:  Keep the dressings to your right foot/ leg clean, dry, and intact until your postoperative appointment in clinic.  If the dressings become wet or saturated or loosened, please contact our office for instructions.  If able to change the dressing then do so by removing the dressing, apply nonadherent gauze to the incision site, 4 x 4 gauze, roll gauze, Ace wrap.  Again only change the dressing if it becomes saturated or loosened, please leave the dressing on until your appointment if possible.  ACTIVITY: Ambulate as minimally as possible to the affected lower extremity in a surgical shoe at all times with heel contact only. Try to limit weightbearing to around the house activities.The more able to stay off of your foot the more likely the incision is to heal.  ANTIBIOTICS: If you were prescribed antibiotics on discharge, please take as instructed until the whole course is finished.   FOLLOW UP: Please make an appointment for 1 week after discharge.  If there are any questions or issues in reference to your lower extremity care that arise in interim of your next appointment, please call the clinic for assistance.    [facility]: [address] [place], [place] [address] Phone: [phone] Hours:Mon - Friday: 8:00am - 5:00pm

## 2024-02-25 NOTE — Progress Notes (Signed)
 Gill: PROGRESS NOTE    Surgery:  Procedure(s) (LRB): AMPUTATION, FOOT, RAY (Right)  *Second digit amputation with partial second ray resection, RIGHT foot.  POD:  3 Days Post-Op  O/N: NAEON  Subjective:  Patient resting comfortably in chair at bedside - foot elevated with post op shoe intact. Dressings c/d/I. Feeling good.  Denies F/C/N/V/SOB/CP. Denies acute calf pain.  Would like to know est. Plan for DC - hopes to be DC prior to weekend.   PHYSICAL EXAMINATION: BP 130/85 (BP Location: Right Arm)   Pulse 61   Temp 98.2 F (36.8 C)   Resp 17   Ht 6' (1.829 m)   Wt 81.6 kg   SpO2 98%   BMI 24.41 kg/m ? GEN: NAD. AOX3. ? RESP: Non-labored breathing on RA.? ABD: NT/ND of all four quadrants.? NEURO: Moving all four extremities spontaneously. ? ? FOCUSED LOWER EXTREMITY EXAMINATION:? NEURO: ? - No gross motor deficits noted - LOPS to nerve distributions to foot ? - No paresthesias elicited on examination. ??  VASCULAR: ? - DP/PT 2 - Capillary sluggish - but present ? - Dressings intact - when moved, edema has improved but still present ; as expected for post-operative state. Mild ecchymosis to incision site.   MSK: ? - TTP - none noted on exam - s/p R 2nd, 3rd, 4th digit amputations. ? - No calf tenderness. ?  DERM: ? - Dressings c/d/I - ACE. - No proximal streaking. No malodor. ? ?? Imaging: ? Study Result  Narrative & Impression  EXAM: 3 or more VIEW(S) XRAY OF THE RIGHT FOOT 02/22/2024 02:10:00 PM   COMPARISON: 11/20/2022   CLINICAL HISTORY: [id number] Post-operative state [id number]. FINDINGS:   Transmetatarsal amputation of the second ray. Previous resection of the fourth toe. Previous distal third metatarsal resection. 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 Expected postoperative changes in the second metatarsal bed.   IMPRESSION: 1. Interval transmetatarsal amputation of the second ray. 2. 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   Electronically signed  by: Andrea Gasman MD 02/22/2024 02:53 PM EDT RP Workstation: [id number]    Results for orders placed or performed during the hospital encounter of 02/21/24  Culture, blood (Routine X 2) w Reflex to ID Panel     Status: None (Preliminary result)   Collection Time: 02/21/24  2:20 PM   Specimen: BLOOD  Result Value Ref Range Status   Specimen Description BLOOD RIGHT ARM  Final   Special Requests   Final    BOTTLES DRAWN AEROBIC AND ANAEROBIC Blood Culture adequate volume   Culture   Final    NO GROWTH 4 DAYS Performed at [facility], [address] [place], [place] [address]    Report Status PENDING  Incomplete  Culture, blood (Routine X 2) w Reflex to ID Panel     Status: None (Preliminary result)   Collection Time: 02/21/24  2:27 PM   Specimen: BLOOD  Result Value Ref Range Status   Specimen Description BLOOD LEFT ARM  Final   Special Requests   Final    BOTTLES DRAWN AEROBIC AND ANAEROBIC Blood Culture adequate volume   Culture   Final    NO GROWTH 4 DAYS Performed at [facility], [address] [place], [place] [address]    Report Status PENDING  Incomplete  Aerobic/Anaerobic Culture w Gram Stain (surgical/deep wound)     Status: None (Preliminary result)   Collection Time: 02/22/24 12:17 PM   Specimen: Foot, Right; Tissue  Result Value Ref Range Status  Specimen Description   Final    WOUND Performed at [facility], [address] [place], [place] [address]    Special Requests   Final    RF Performed at [facility], [address] [place], [place] [address]    Gram Stain   Final    NO WBC SEEN NO ORGANISMS SEEN Performed at [facility], [address], [place], [place] [address]    Culture   Final    CULTURE REINCUBATED FOR BETTER GROWTH NO ANAEROBES ISOLATED; CULTURE IN PROGRESS FOR 5 DAYS    Report Status PENDING  Incomplete    ID Type Source Tests Collected by Time Destination  1 : Right Second Toe  Tissue PATH Digit amputation SURGICAL PATHOLOGY Gill [name], DPM 02/22/2024 1215   2 : Right Second Metatasrsal Distal margin Tissue Foot, Right SURGICAL PATHOLOGY [place] [name], DPM 02/22/2024 1223   3 : Right Second Metatarsal proximalmargin Tissue Foot, Right SURGICAL PATHOLOGY [place] [name], DPM 02/22/2024 1224   A : Deep tissue right Foot Tissue Foot, Right AEROBIC/ANAEROBIC CULTURE W GRAM STAIN (SURGICAL/DEEP WOUND) [place] [name], DPM 02/22/2024 1217      ASSESSMENT:?  Alexander Gill is a 68 y.o. male presenting with R DFI and suspected osteomyelitis of the RIGHT foot which he is now POD3 from R 2nd digit amputation with partial ray resection ; doing well.   PLAN:? - Activity: Heel weight bearing to the RIGHT lower extremity for transfer ONLY in post-operative shoe. Full WBAT on the LEFT lower extremity.   - Diet: DM diet  - Wound Care: None required on discharge - however if there is strikethrough or bandaging gets wet, ok to change with DSD and betadine (patient instructed on how to conduct) Materials: Betadine / Adaptic / Gauze (4x4) / Kerlix / 4 inch ACE bandage Instructions: *Gill to conduct post-op. Supplement Reccs: Okay to reinforce with dry gauze / loose ACE overlap prn strikethrough. Please leave underlayer intact.  - ABX: Appreciate assistance with antibiotic stewardship from medicine / pharmacy / ID services.   CURRENT: 02/24/24  - Zosyn  3.375g IV q8h   - Vancomycin  1g IV q12h  - Dispo: Home - patient to follow up with Gill within 1 week of DC.    Alexander Kemler G. Gill, Alexander Gill  Alexander Gill

## 2024-02-25 NOTE — Plan of Care (Signed)

## 2024-02-25 NOTE — Progress Notes (Signed)
 Patient was given verbal discharge instructions and states he will comply and keep all appointments.Patient is waiting for ride.

## 2024-02-25 NOTE — Consult Note (Signed)
 NAME: Alexander Gill  DOB: [birth year]  MRN: [id number]  Date/Time: 02/25/2024 9:47 AM  REQUESTING PROVIDER: Dr.Williams Subjective:  REASON FOR CONSULT: rt foot infection ? Alexander Gill is a 68 y.o. with a history  of DM with neuropathy, HTN, diabetic foot infection, left TMA, MSSA bacteremia, Mitral valve endocarditis and Mitral regurgitation followed by cardiology at [facility] ( watchful waiting) followed by podiatrist for rt foot callus and on 02/21/24 saw podiatrist for a routine visit and was found to have an ulcer which according to him was recent . HE was send to the ED for admission and 2nd toe amputation. No fever or chills, has been working until 02/17/24. He now has a Office manager mostly HE had not takne any antibiotics in the past many months  02/21/24  BP 128/79  Temp 97.6 F (36.4 C)  Pulse Rate 60  Resp 18  SpO2 100 %    Latest Reference Range & Units 02/21/24  WBC 4.0 - 10.5 K/uL 9.9  Hemoglobin 13.0 - 17.0 g/dL 84.4  HCT 60.9 - 47.9 % 46.6  Platelets 150 - 400 K/uL 258  Creatinine 0.61 - 1.24 mg/dL 9.21   He was taken for surgery on 8/5 and underwent second toe partial ray excision- bone culture and pathology pending currently on IV vanco and zosyn --I am asked to see him  for antibiotic management-    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   GERD (gastroesophageal reflux disease)    h/o   Hammertoe of right foot    Heart abnormality    Enlarged left ventricle   History of cellulitis    Right LE   Hyperlipidemia    Hypertension    Hypokalemia    Hypothyroidism    MSSA bacteremia    Nose colonized with MRSA 2023   Osteomyelitis of left foot (HCC)    Pyogenic inflammation of bone (HCC)    S/P transmetatarsal amputation of foot, left (HCC)    Toe osteomyelitis (HCC)    right foot    Past Surgical History:  Procedure Laterality  Date   AMPUTATION Right 10/22/2021   Procedure: AMPUTATION RAY-PARTIAL 3RD RAY;  Surgeon: Lennie Barter, DPM;  Location: [facility];  Service: Podiatry;  Laterality: Right;   AMPUTATION Left 11/22/2022   Procedure: Transmetatarsal Amuptation Left Foot;  Surgeon: Neill Boas, DPM;  Location: [facility];  Service: Podiatry;  Laterality: Left;   AMPUTATION Right 02/22/2024   Procedure: AMPUTATION, FOOT, RAY;  Surgeon: Tanda [name], DPM;  Location: [facility];  Service: Podiatry;  Laterality: Right;   AMPUTATION TOE Right 07/05/2021   Procedure: AMPUTATION RIGHT 4TH TOE;  Surgeon: Neill Boas, DPM;  Location: [facility];  Service: Podiatry;  Laterality: Right;   HERNIA REPAIR     INCISION AND DRAINAGE OF WOUND Left 10/22/2021   Procedure: IRRIGATION AND DEBRIDEMENT WOUND;  Surgeon: Lennie Barter, DPM;  Location: [facility];  Service: Podiatry;  Laterality: Left;   TEE WITHOUT CARDIOVERSION N/A 11/26/2022   Procedure: TRANSESOPHAGEAL ECHOCARDIOGRAM;  Surgeon: Perla Evalene PARAS, MD;  Location: [facility];  Service: Cardiovascular;  Laterality: N/A;   TOE ARTHROPLASTY Left 10/22/2021   Procedure: TOE ARTHROPLASTY;  Surgeon: Lennie Barter, DPM;  Location: [facility];  Service: Podiatry;  Laterality: Left;    Social History   Socioeconomic History   Marital status:  Married    Spouse name: Not on file   Number of children: Not on file   Years of education: Not on file   Highest education level: Not on file  Occupational History   Not on file  Tobacco Use   Smoking status: Never   Smokeless tobacco: Never  Vaping Use   Vaping status: Never Used  Substance and Sexual Activity   Alcohol use: Not Currently   Drug use: Not Currently   Sexual activity: Not Currently  Other Topics Concern   Not on file  Social History Narrative   Not on file   Social Drivers of Health   Financial Resource Strain: Low Risk  (06/28/2023)   Received from [facility]   Overall Financial Resource Strain (CARDIA)     Difficulty of Paying Living Expenses: Not very hard  Recent Concern: Financial Resource Strain - Medium Risk (06/14/2023)   Received from [facility]   Overall Financial Resource Strain (CARDIA)    Difficulty of Paying Living Expenses: Somewhat hard  Food Insecurity: No Food Insecurity (02/21/2024)   Hunger Vital Sign    Worried About Running Out of Food in the Last Year: Never true    Ran Out of Food in the Last Year: Never true  Transportation Needs: No Transportation Needs (02/21/2024)   PRAPARE - Administrator, Civil Service (Medical): No    Lack of Transportation (Non-Medical): No  Physical Activity: Not on file  Stress: Not on file  Social Connections: Socially Integrated (02/21/2024)   Social Connection and Isolation Panel    Frequency of Communication with Friends and Family: More than three times a week    Frequency of Social Gatherings with Friends and Family: More than three times a week    Attends Religious Services: More than 4 times per year    Active Member of [employer] or Organizations: Yes    Attends Banker Meetings: 1 to 4 times per year    Marital Status: Married  Catering manager Violence: Not At Risk (02/21/2024)   Humiliation, Afraid, Rape, and Kick questionnaire    Fear of Current or Ex-Partner: No    Emotionally Abused: No    Physically Abused: No    Sexually Abused: No    Family History  Problem Relation Age of Onset   Diabetes Mother    Diabetes Father    Parkinson's disease Brother    No Known Allergies I? Current Facility-Administered Medications  Medication Dose Route Frequency Provider Last Rate Last Admin   acetaminophen  (TYLENOL ) tablet 650 mg  650 mg Oral Q6H PRN Tanda, Amy G, DPM       Or   acetaminophen  (TYLENOL ) suppository 650 mg  650 mg Rectal Q6H PRN Tanda, Amy G, DPM       ascorbic acid  (VITAMIN C ) tablet 250 mg  250 mg Oral BID Tanda [name], DPM   250 mg at 02/25/24 9175   aspirin  EC tablet 81 mg   81 mg Oral Daily Tanda [name], DPM   81 mg at 02/25/24 0824   cholecalciferol  (VITAMIN D3) 25 MCG (1000 UNIT) tablet 1,000 Units  1,000 Units Oral QPM Wilson, Amy G, DPM   1,000 Units at 02/24/24 1700   enoxaparin  (LOVENOX ) injection 40 mg  40 mg Subcutaneous Q24H Wilson, Amy G, DPM   40 mg at 02/24/24 2316   losartan  (COZAAR ) tablet 100 mg  100 mg Oral Daily Tanda [name], DPM  100 mg at 02/25/24 9175   And   hydrochlorothiazide  (HYDRODIURIL ) tablet 25 mg  25 mg Oral Daily Tanda [name], DPM   25 mg at 02/25/24 9175   HYDROcodone -acetaminophen  (NORCO/VICODIN) 5-325 MG per tablet 1 tablet  1 tablet Oral Q4H PRN Tanda [name], DPM   1 tablet at 02/23/24 1144   insulin  aspart (novoLOG ) injection 0-15 Units  0-15 Units Subcutaneous TID AC & HS Duncan, Hazel V, MD   3 Units at 02/25/24 0825   insulin  glargine-yfgn (SEMGLEE ) injection 20 Units  20 Units Subcutaneous Daily Tanda [name], DPM   20 Units at 02/25/24 9150   levothyroxine  (SYNTHROID ) tablet 25 mcg  25 mcg Oral Q0600 Tanda [name], DPM   25 mcg at 02/25/24 9375   ondansetron  (ZOFRAN ) tablet 4 mg  4 mg Oral Q6H PRN Tanda [name], DPM       Or   ondansetron  (ZOFRAN ) injection 4 mg  4 mg Intravenous Q6H PRN Tanda, Amy G, DPM       piperacillin -tazobactam (ZOSYN ) IVPB 3.375 g  3.375 g Intravenous Q8H Lenon Pons L, MD 12.5 mL/hr at 02/25/24 0618 3.375 g at 02/25/24 0618   polyethylene glycol (MIRALAX  / GLYCOLAX ) packet 17 g  17 g Oral Daily PRN Tanda [name], DPM       rosuvastatin  (CRESTOR ) tablet 20 mg  20 mg Oral QPM Wilson, Amy G, DPM   20 mg at 02/24/24 1700   vancomycin  (VANCOCIN ) IVPB 1000 mg/200 mL premix  1,000 mg Intravenous Q12H Wilson, Amy G, DPM 200 mL/hr at 02/25/24 0229 1,000 mg at 02/25/24 0229     Abtx:  Anti-infectives (From admission, onward)    Start     Dose/Rate Route Frequency Ordered Stop   02/22/24 1800  piperacillin -tazobactam (ZOSYN ) IVPB 3.375 g        3.375 g 12.5 mL/hr over 240 Minutes Intravenous  Every 8 hours 02/22/24 1713     02/22/24 1500  vancomycin  (VANCOCIN ) IVPB 1000 mg/200 mL premix        1,000 mg 200 mL/hr over 60 Minutes Intravenous Every 12 hours 02/22/24 1008     02/22/24 0300  vancomycin  (VANCOREADY) IVPB 1250 mg/250 mL  Status:  Discontinued        1,250 mg 166.7 mL/hr over 90 Minutes Intravenous Every 12 hours 02/21/24 1440 02/22/24 1008   02/21/24 1400  vancomycin  (VANCOREADY) IVPB 2000 mg/400 mL        2,000 mg 200 mL/hr over 120 Minutes Intravenous  Once 02/21/24 1315 02/21/24 2011   02/21/24 1330  piperacillin -tazobactam (ZOSYN ) IVPB 3.375 g  Status:  Discontinued        3.375 g 12.5 mL/hr over 240 Minutes Intravenous Every 8 hours 02/21/24 1315 02/22/24 1713   02/21/24 1300  vancomycin  (VANCOCIN ) IVPB 1000 mg/200 mL premix  Status:  Discontinued        1,000 mg 200 mL/hr over 60 Minutes Intravenous  Once 02/21/24 1247 02/21/24 1302   02/21/24 1300  piperacillin -tazobactam (ZOSYN ) IVPB 3.375 g  Status:  Discontinued        3.375 g 100 mL/hr over 30 Minutes Intravenous  Once 02/21/24 1247 02/21/24 1302       REVIEW OF SYSTEMS:  Const: negative fever, negative chills, negative weight loss Eyes: negative diplopia or visual changes, negative eye pain ENT: negative coryza, negative sore throat Resp: negative cough, hemoptysis, dyspnea Cards: negative for chest pain, palpitations, lower extremity edema GU: negative for frequency, dysuria and hematuria GI:  Negative for abdominal pain, diarrhea, bleeding, constipation Skin: negative for rash and pruritus Heme: negative for easy bruising and gum/nose bleeding MS: negative for myalgias, arthralgias, back pain and muscle weakness Neurolo:negative for headaches, dizziness, vertigo, memory problems  Psych: negative for feelings of anxiety, depression  Endocrine: negative for thyroid, diabetes Allergy/Immunology- negative for any medication or food allergies  Objective:  VITALS:  BP 130/85 (BP Location: Right  Arm)   Pulse 61   Temp 98.2 F (36.8 C)   Resp 17   Ht 6' (1.829 m)   Wt 81.6 kg   SpO2 98%   BMI 24.41 kg/m   PHYSICAL EXAM:  General: Alert, cooperative, no distress, appears stated age.  Head: Normocephalic, without obvious abnormality, atraumatic. Eyes: Conjunctivae clear, anicteric sclerae. Pupils are equal ENT Nares normal. No drainage or sinus tenderness. Lips, mucosa, and tongue normal. No Thrush Neck: Supple, symmetrical, no adenopathy, thyroid: non tender no carotid bruit and no JVD. Back: No CVA tenderness. Lungs: Clear to auscultation bilaterally. No Wheezing or Rhonchi. No rales. Heart: loud systolic murmur Abdomen: Soft, non-tender,not distended. Bowel sounds normal. No masses Extremities: rt foot Surgical site has expected eswelling and oozing 2 nd toe amputation-      Skin: No rashes or lesions. Or bruising Lymph: Cervical, supraclavicular normal. Neurologic: Grossly non-focal Pertinent Labs Lab Results CBC    Component Value Date/Time   WBC 9.4 02/22/2024 0310   RBC 4.85 02/22/2024 0310   HGB 14.3 02/22/2024 0310   HGB 15.1 10/01/2023 0815   HCT 42.5 02/22/2024 0310   HCT 45.9 10/01/2023 0815   PLT 235 02/22/2024 0310   PLT 231 10/01/2023 0815   MCV 87.6 02/22/2024 0310   MCV 90 10/01/2023 0815   MCH 29.5 02/22/2024 0310   MCHC 33.6 02/22/2024 0310   RDW 13.6 02/22/2024 0310   RDW 13.2 10/01/2023 0815   LYMPHSABS 1.6 02/21/2024 1116   MONOABS 0.8 02/21/2024 1116   EOSABS 0.5 02/21/2024 1116   BASOSABS 0.1 02/21/2024 1116       Latest Ref Rng & Units 02/23/2024   12:10 PM 02/22/2024    3:10 AM 02/21/2024   11:16 AM  CMP  Glucose 70 - 99 mg/dL  869  786   BUN 8 - 23 mg/dL  21  23   Creatinine 9.38 - 1.24 mg/dL 9.10  9.04  9.21   Sodium 135 - 145 mmol/L  140  140   Potassium 3.5 - 5.1 mmol/L  3.6  3.8   Chloride 98 - 111 mmol/L  106  105   CO2 22 - 32 mmol/L  27  23   Calcium  8.9 - 10.3 mg/dL  9.2  9.6   Total Protein 6.5 - 8.1 g/dL    7.7   Total Bilirubin 0.0 - 1.2 mg/dL   0.8   Alkaline Phos 38 - 126 U/L   77   AST 15 - 41 U/L   23   ALT 0 - 44 U/L   19       Microbiology: Recent Results (from the past 240 hours)  Culture, blood (Routine X 2) w Reflex to ID Panel     Status: None (Preliminary result)   Collection Time: 02/21/24  2:20 PM   Specimen: BLOOD  Result Value Ref Range Status   Specimen Description BLOOD RIGHT ARM  Final   Special Requests   Final    BOTTLES DRAWN AEROBIC AND ANAEROBIC Blood Culture adequate volume   Culture   Final  NO GROWTH 4 DAYS Performed at [facility], [address] [place], [place] [address]    Report Status PENDING  Incomplete  Culture, blood (Routine X 2) w Reflex to ID Panel     Status: None (Preliminary result)   Collection Time: 02/21/24  2:27 PM   Specimen: BLOOD  Result Value Ref Range Status   Specimen Description BLOOD LEFT ARM  Final   Special Requests   Final    BOTTLES DRAWN AEROBIC AND ANAEROBIC Blood Culture adequate volume   Culture   Final    NO GROWTH 4 DAYS Performed at [facility], [address], [place], [place] [address]    Report Status PENDING  Incomplete  Aerobic/Anaerobic Culture w Gram Stain (surgical/deep wound)     Status: None (Preliminary result)   Collection Time: 02/22/24 12:17 PM   Specimen: Foot, Right; Tissue  Result Value Ref Range Status   Specimen Description   Final    WOUND Performed at [facility], [address] [place], [place] [address]    Special Requests   Final    RF Performed at [facility], [address], [place], [place] [address]    Gram Stain   Final    NO WBC SEEN NO ORGANISMS SEEN Performed at [facility], [address] [place], [place] [address]    Culture   Final    CULTURE REINCUBATED FOR BETTER GROWTH NO ANAEROBES ISOLATED; CULTURE IN PROGRESS FOR 5 DAYS    Report Status PENDING  Incomplete    Lines and Device Date on insertion # of days  DC  Central line     Foley     ETT       IMAGING RESULTS: XRAY after the surgery shows amputated 2nd toe I have personally reviewed the films ? Impression/Recommendation ? ?Diabetic foot infection- with neuropathy  S/p 2nd toe partial ray amputation On vanco/zosyn  No signs of systemic or local infeciton now Can discharge him on PO augmentin  875mg  BID for 2 weeks  and will follow him as OP and check pathology and culture  H/o MSSA bacteremia and endocarditis  Mitral regurgitation  Left foot TMA  Discussed the management with the patient an dhospitalist and podiatrist  Follow up appt given for 8/19

## 2024-02-25 NOTE — Discharge Summary (Signed)
 Physician Discharge Summary  Alexander Gill FMW:[id number] DOB: [birth year] DOA: 02/21/2024  PCP: [facility]  Admit date: 02/21/2024 Discharge date: 02/25/2024  Admitted From:home  Disposition:  home  Recommendations for Outpatient Follow-up:  Follow up with PCP in 1-2 weeks F/u w/ podiatry, Dr. Tanda or Dr. Neill, in 1 week  Home Health: no  Equipment/Devices:  Discharge Condition: stable  CODE STATUS: full  Diet recommendation: Heart Healthy / Carb Modified  Brief/Interim Summary: HPI was taken from Dr. Jillian: Alexander Gill is a 68 y.o. male with medical history significant of diabetes type 2, hypertension, hyperlipidemia, hypothyroidism who presented to the emergency department as recommended by his podiatrist for the evaluation of right foot wound. Patient has nonhealing ulcer on his right foot. He had an x-ray done at podiatry office and was recommended to go to the Emergency Department for IV antibiotics and further plan for surgery.  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 Patient seen and examined at bedside in the emergency department.  During my evaluation, he was very comfortable.  Hemodynamically stable.  Lab work does not show leukocytosis. No reported fever, chills, chest pain, shortness of breath, abdomen pain, nausea, vomiting.            ED Course: Patient remains hemodynamically stable throughout the emergency department stay.  Started on IV antibiotics.  Plan for surgery tomorrow.  Will be kept n.p.o. 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 S/p amputation on 8/5 as per podiatry. Wound cx NGTD. Heel weight bearing to the RIGHT  lower extremity for transfer ONLY in post-operative shoe. Full WBAT on the LEFT lower extremity as per podiatry    DM2: well controlled, HbA1c 6.5. Continue on SSI w/ accuchecks   HTN: continue on losartan , HCTZ   HLD: continue on statin    Hypothyroidism: continue on home dose of levothyroxine     Discharge Instructions  Discharge Instructions     Diet Carb Modified   Complete by: As directed    Discharge instructions   Complete by: As directed    F/u w/ podiatry, Dr. Tanda or Dr. Neill, in 1 week. F/u w/ PCP in 1-2 weeks. Heel weight bearing to the RIGHT lower extremity for transfer ONLY in post-operative shoe. Full weight bearing as tolerated on the LEFT lower extremity.   None required on discharge - however if there is strikethrough or bandaging gets wet, ok to change with DSD and betadine (patient instructed on how to conduct) Supplement Reccs: Okay to reinforce with dry gauze / loose ACE overlap prn strikethrough. Please leave underlayer intact.   Increase activity slowly   Complete by: As directed       Allergies as of 02/25/2024   No Known Allergies      Medication List     STOP taking these medications    ascorbic acid  250 MG tablet Commonly known as: VITAMIN C    ascorbic acid  500 MG tablet Commonly known as: VITAMIN C        TAKE these medications    amoxicillin -clavulanate 875-125 MG tablet Commonly known as: AUGMENTIN  Take 1 tablet by mouth 2 (two) times daily for 14 days.  aspirin  EC 81 MG tablet Take 81 mg by mouth daily. Swallow whole.   cholecalciferol  25 MCG (1000 UNIT) tablet Commonly known as: VITAMIN D3 Take 1,000 Units by mouth daily.   insulin  glargine 100 UNIT/ML Solostar Pen Commonly known as: LANTUS  Inject 38-40 Units into the skin every evening.   levothyroxine  25 MCG tablet Commonly known as: SYNTHROID  Take 25 mcg by mouth daily before breakfast.   losartan -hydrochlorothiazide  100-25 MG tablet Commonly known as:  HYZAAR Take 1 tablet by mouth every morning.   rosuvastatin  20 MG tablet Commonly known as: CRESTOR  Take 20 mg by mouth every evening.   Rybelsus 3 MG Tabs Generic drug: Semaglutide Take 3 mg by mouth every evening.   Xigduo  XR 11-998 MG Tb24 Generic drug: Dapagliflozin  Pro-metFORMIN  ER Take 1 tablet by mouth in the morning and at bedtime.        No Known Allergies  Consultations: Ortho surg ID   Procedures/Studies: DG Foot Complete Right Result Date: 02/22/2024 EXAM: 3 or more VIEW(S) XRAY OF THE RIGHT FOOT 02/22/2024 02:10:00 PM COMPARISON: 11/20/2022 CLINICAL HISTORY: [id number] Post-operative state 252351. FINDINGS: Transmetatarsal amputation of the second ray. Previous resection of the fourth toe. Previous distal third metatarsal resection. 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 Expected postoperative changes in the second metatarsal bed. IMPRESSION: 1. Interval transmetatarsal amputation of the second ray. 2. 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 Electronically signed by: Andrea Gasman MD 02/22/2024 02:53 PM EDT RP Workstation: [id number]   (Echo, Carotid, EGD, Colonoscopy, ERCP)    Subjective: Pt denies any foot pain    Discharge Exam: Vitals:   02/25/24 0221 02/25/24 0728  BP: 120/76 130/85  Pulse: (!) 58 61  Resp: 17 17  Temp: 98 F (36.7 C) 98.2 F (36.8 C)  SpO2: 98% 98%   Vitals:   02/24/24 2016 02/24/24 2328 02/25/24 0221 02/25/24 0728  BP: 119/77 113/73 120/76 130/85  Pulse: 62 61 (!) 58 61  Resp: 18 18 17 17   Temp: 98.3 F (36.8 C) 98.1 F (36.7 C) 98 F (36.7 C) 98.2 F (36.8 C)  TempSrc: Oral     SpO2: 100% 95% 98% 98%  Weight:      Height:        General: Pt is alert, awake, not in acute distress Cardiovascular: S1/S2 +, no rubs, no gallops Respiratory: CTA bilaterally, no wheezing, no rhonchi Abdominal: Soft, NT, ND, bowel sounds + Extremities: no edema, no cyanosis    The results of significant diagnostics from this hospitalization  (including imaging, microbiology, ancillary and laboratory) are listed below for reference.     Microbiology: Recent Results (from the past 240 hours)  Culture, blood (Routine X 2) w Reflex to ID Panel     Status: None (Preliminary result)   Collection Time: 02/21/24  2:20 PM   Specimen: BLOOD  Result Value Ref Range Status   Specimen Description BLOOD RIGHT ARM  Final   Special Requests   Final    BOTTLES DRAWN AEROBIC AND ANAEROBIC Blood Culture adequate volume   Culture   Final    NO GROWTH 4 DAYS Performed at [facility], [address] [place], [place] [address]    Report Status PENDING  Incomplete  Culture, blood (Routine X 2) w Reflex to ID Panel     Status: None (Preliminary result)   Collection Time: 02/21/24  2:27 PM   Specimen: BLOOD  Result Value Ref Range Status   Specimen Description BLOOD LEFT ARM  Final   Special Requests  Final    BOTTLES DRAWN AEROBIC AND ANAEROBIC Blood Culture adequate volume   Culture   Final    NO GROWTH 4 DAYS Performed at [facility], [address] [place], [place] [address]    Report Status PENDING  Incomplete  Aerobic/Anaerobic Culture w Gram Stain (surgical/deep wound)     Status: None (Preliminary result)   Collection Time: 02/22/24 12:17 PM   Specimen: Foot, Right; Tissue  Result Value Ref Range Status   Specimen Description   Final    WOUND Performed at [facility], [address], [place], [place] [address]    Special Requests   Final    RF Performed at [facility], [address] [place], [place] [address]    Gram Stain   Final    NO WBC SEEN NO ORGANISMS SEEN Performed at [facility], [address] [place], [place] [address]    Culture   Final    CULTURE REINCUBATED FOR BETTER GROWTH NO ANAEROBES ISOLATED; CULTURE IN PROGRESS FOR 5 DAYS    Report Status PENDING  Incomplete     Labs: BNP (last 3 results) No results for input(s): BNP in the last 8760  hours. Basic Metabolic Panel: Recent Labs  Lab 02/21/24 1116 02/22/24 0310 02/23/24 1210  NA 140 140  --   K 3.8 3.6  --   CL 105 106  --   CO2 23 27  --   GLUCOSE 213* 130*  --   BUN 23 21  --   CREATININE 0.78 0.95 0.89  CALCIUM  9.6 9.2  --    Liver Function Tests: Recent Labs  Lab 02/21/24 1116  AST 23  ALT 19  ALKPHOS 77  BILITOT 0.8  PROT 7.7  ALBUMIN 4.3   No results for input(s): LIPASE, AMYLASE in the last 168 hours. No results for input(s): AMMONIA in the last 168 hours. CBC: Recent Labs  Lab 02/21/24 1116 02/22/24 0310  WBC 9.9 9.4  NEUTROABS 6.9  --   HGB 15.5 14.3  HCT 46.6 42.5  MCV 87.6 87.6  PLT 258 235   Cardiac Enzymes: No results for input(s): CKTOTAL, CKMB, CKMBINDEX, TROPONINI in the last 168 hours. BNP: Invalid input(s): POCBNP CBG: Recent Labs  Lab 02/24/24 1119 02/24/24 1623 02/24/24 2250 02/25/24 0732 02/25/24 1059  GLUCAP 245* 200* 135* 154* 269*   D-Dimer No results for input(s): DDIMER in the last 72 hours. Hgb A1c No results for input(s): HGBA1C in the last 72 hours. Lipid Profile No results for input(s): CHOL, HDL, LDLCALC, TRIG, CHOLHDL, LDLDIRECT in the last 72 hours. Thyroid function studies No results for input(s): TSH, T4TOTAL, T3FREE, THYROIDAB in the last 72 hours.  Invalid input(s): FREET3 Anemia work up No results for input(s): VITAMINB12, FOLATE, FERRITIN, TIBC, IRON, RETICCTPCT in the last 72 hours. Urinalysis No results found for: COLORURINE, APPEARANCEUR, LABSPEC, PHURINE, GLUCOSEU, HGBUR, BILIRUBINUR, KETONESUR, PROTEINUR, UROBILINOGEN, NITRITE, LEUKOCYTESUR Sepsis Labs Recent Labs  Lab 02/21/24 1116 02/22/24 0310  WBC 9.9 9.4   Microbiology Recent Results (from the past 240 hours)  Culture, blood (Routine X 2) w Reflex to ID Panel     Status: None (Preliminary result)   Collection Time: 02/21/24  2:20 PM   Specimen:  BLOOD  Result Value Ref Range Status   Specimen Description BLOOD RIGHT ARM  Final   Special Requests   Final    BOTTLES DRAWN AEROBIC AND ANAEROBIC Blood Culture adequate volume   Culture   Final  NO GROWTH 4 DAYS Performed at [facility], [address] [place], [place] [address]    Report Status PENDING  Incomplete  Culture, blood (Routine X 2) w Reflex to ID Panel     Status: None (Preliminary result)   Collection Time: 02/21/24  2:27 PM   Specimen: BLOOD  Result Value Ref Range Status   Specimen Description BLOOD LEFT ARM  Final   Special Requests   Final    BOTTLES DRAWN AEROBIC AND ANAEROBIC Blood Culture adequate volume   Culture   Final    NO GROWTH 4 DAYS Performed at [facility], [address] [place], [place] [address]    Report Status PENDING  Incomplete  Aerobic/Anaerobic Culture w Gram Stain (surgical/deep wound)     Status: None (Preliminary result)   Collection Time: 02/22/24 12:17 PM   Specimen: Foot, Right; Tissue  Result Value Ref Range Status   Specimen Description   Final    WOUND Performed at [facility], [address] [place], [place] [address]    Special Requests   Final    RF Performed at [facility], [address] [place], [place] [address]    Gram Stain   Final    NO WBC SEEN NO ORGANISMS SEEN Performed at [facility], [address], [place], [place] [address]    Culture   Final    CULTURE REINCUBATED FOR BETTER GROWTH NO ANAEROBES ISOLATED; CULTURE IN PROGRESS FOR 5 DAYS    Report Status PENDING  Incomplete     Time coordinating discharge:33 minutes  SIGNED:   Anthony CHRISTELLA Pouch, MD  [facility] 02/25/2024, 12:04 PM Pager   If 7PM-7AM, please contact night-coverage [email or web address]

## 2024-02-25 NOTE — Inpatient Diabetes Management (Signed)
 Inpatient Diabetes Program Recommendations  AACE/ADA: New Consensus Statement on Inpatient Glycemic Control   Target Ranges:  Prepandial:   less than 140 mg/dL      Peak postprandial:   less than 180 mg/dL (1-2 hours)      Critically ill patients:  140 - 180 mg/dL    Latest Reference Range & Units 02/24/24 07:49 02/24/24 11:19 02/24/24 16:23 02/24/24 22:50 02/25/24 07:32 02/25/24 10:59  Glucose-Capillary 70 - 99 mg/dL 862 (H) 754 (H) 799 (H) 135 (H) 154 (H) 269 (H)   Review of Glycemic Control  Diabetes history: DM2 Outpatient Diabetes medications: Xigduo  XR 11-998 mg BID, Lantus  38-40 units QPM, Rybelsus 3 mg QPM Current orders for Inpatient glycemic control: Semglee  20 units daily, Novolog  0-15 units AC&HS  Inpatient Diabetes Program Recommendations:    Insulin : Please consider ordering Novolog  3 units TID with meals for meal coverage if patient eats at least 50% of meals.  Thanks, Earnie Gainer, RN, MSN, CDCES Diabetes Coordinator Inpatient Diabetes Program [phone] (Team Pager from 8am to 5pm)

## 2024-02-26 LAB — CULTURE, BLOOD (ROUTINE X 2)
Culture: NO GROWTH
Culture: NO GROWTH
Special Requests: ADEQUATE
Special Requests: ADEQUATE

## 2024-02-27 LAB — AEROBIC/ANAEROBIC CULTURE W GRAM STAIN (SURGICAL/DEEP WOUND): Gram Stain: NONE SEEN

## 2024-02-28 LAB — SURGICAL PATHOLOGY

## 2024-02-29 ENCOUNTER — Telehealth: Payer: Self-pay

## 2024-02-29 ENCOUNTER — Other Ambulatory Visit: Payer: Self-pay | Admitting: Infectious Diseases

## 2024-02-29 MED ORDER — DOXYCYCLINE MONOHYDRATE 100 MG PO TABS: 100.0000 mg | ORAL_TABLET | Freq: Two times a day (BID) | ORAL | 0 refills | Status: DC

## 2024-02-29 MED ORDER — DOXYCYCLINE MONOHYDRATE 100 MG PO TABS: 100.0000 mg | ORAL_TABLET | Freq: Two times a day (BID) | ORAL | 0 refills | Status: AC

## 2024-02-29 NOTE — Progress Notes (Signed)
 Doxy prescription sent for staph capitis in culture Left a message for the patient

## 2024-02-29 NOTE — Telephone Encounter (Signed)
 Yes- canceled previous script that was sent to [facility].

## 2024-02-29 NOTE — Telephone Encounter (Signed)
 Patient returning missed call from  Dr. Fayette. Informed patient that MD called in Doxycyline to [facility] pharmacy in [place] due to culture results. Pt would like medication switched to [facility] in [place].  Lorenda CHRISTELLA Code, RMA

## 2024-03-07 ENCOUNTER — Encounter: Payer: Self-pay | Admitting: Infectious Diseases

## 2024-03-07 ENCOUNTER — Other Ambulatory Visit
Admission: RE | Admit: 2024-03-07 | Discharge: 2024-03-07 | Disposition: A | Discharge status: Home / Self Care | Attending: Infectious Diseases | Admitting: Infectious Diseases

## 2024-03-07 ENCOUNTER — Ambulatory Visit: Attending: Infectious Diseases | Admitting: Infectious Diseases

## 2024-03-07 VITALS — BP 147/85 | HR 62 | Temp 97.2°F | Ht 72.0 in | Wt 184.0 lb

## 2024-03-07 DIAGNOSIS — I340 Nonrheumatic mitral (valve) insufficiency: Secondary | ICD-10-CM | POA: Diagnosis not present

## 2024-03-07 DIAGNOSIS — L089 Local infection of the skin and subcutaneous tissue, unspecified: Secondary | ICD-10-CM

## 2024-03-07 DIAGNOSIS — Z794 Long term (current) use of insulin: Secondary | ICD-10-CM | POA: Insufficient documentation

## 2024-03-07 DIAGNOSIS — E1140 Type 2 diabetes mellitus with diabetic neuropathy, unspecified: Secondary | ICD-10-CM | POA: Diagnosis not present

## 2024-03-07 DIAGNOSIS — Z89421 Acquired absence of other right toe(s): Secondary | ICD-10-CM | POA: Diagnosis not present

## 2024-03-07 DIAGNOSIS — Z7984 Long term (current) use of oral hypoglycemic drugs: Secondary | ICD-10-CM

## 2024-03-07 DIAGNOSIS — E11621 Type 2 diabetes mellitus with foot ulcer: Secondary | ICD-10-CM | POA: Insufficient documentation

## 2024-03-07 DIAGNOSIS — E11628 Type 2 diabetes mellitus with other skin complications: Secondary | ICD-10-CM | POA: Diagnosis not present

## 2024-03-07 DIAGNOSIS — Z89422 Acquired absence of other left toe(s): Secondary | ICD-10-CM

## 2024-03-07 DIAGNOSIS — Z5986 Financial insecurity: Secondary | ICD-10-CM | POA: Insufficient documentation

## 2024-03-07 DIAGNOSIS — B957 Other staphylococcus as the cause of diseases classified elsewhere: Secondary | ICD-10-CM | POA: Diagnosis not present

## 2024-03-07 LAB — COMPREHENSIVE METABOLIC PANEL WITH GFR
ALT: 19 U/L (ref 0–44)
AST: 23 U/L (ref 15–41)
Albumin: 4.2 g/dL (ref 3.5–5.0)
Alkaline Phosphatase: 76 U/L (ref 38–126)
Anion gap: 12 (ref 5–15)
BUN: 18 mg/dL (ref 8–23)
CO2: 26 mmol/L (ref 22–32)
Calcium: 9.6 mg/dL (ref 8.9–10.3)
Chloride: 103 mmol/L (ref 98–111)
Creatinine, Ser: 0.84 mg/dL (ref 0.61–1.24)
GFR, Estimated: >60 mL/min (ref >60)
Glucose, Bld: 169 mg/dL — ABNORMAL HIGH (ref 70–99)
Potassium: 4 mmol/L (ref 3.5–5.1)
Sodium: 141 mmol/L (ref 135–145)
Total Bilirubin: 0.7 mg/dL (ref 0.0–1.2)
Total Protein: 7.7 g/dL (ref 6.5–8.1)

## 2024-03-07 LAB — CBC WITH DIFFERENTIAL/PLATELET
Abs Immature Granulocytes: 0.03 K/uL (ref 0.00–0.07)
Basophils Absolute: 0.1 K/uL (ref 0.0–0.1)
Basophils Relative: 1 %
Eosinophils Absolute: 0.5 K/uL (ref 0.0–0.5)
Eosinophils Relative: 6 %
HCT: 46.1 % (ref 39.0–52.0)
Hemoglobin: 15.8 g/dL (ref 13.0–17.0)
Immature Granulocytes: 0 %
Lymphocytes Relative: 18 %
Lymphs Abs: 1.8 K/uL (ref 0.7–4.0)
MCH: 29.3 pg (ref 26.0–34.0)
MCHC: 34.3 g/dL (ref 30.0–36.0)
MCV: 85.5 fL (ref 80.0–100.0)
Monocytes Absolute: 0.7 K/uL (ref 0.1–1.0)
Monocytes Relative: 7 %
Neutro Abs: 6.7 K/uL (ref 1.7–7.7)
Neutrophils Relative %: 68 %
Platelets: 228 K/uL (ref 150–400)
RBC: 5.39 MIL/uL (ref 4.22–5.81)
RDW: 13.5 % (ref 11.5–15.5)
WBC: 9.9 K/uL (ref 4.0–10.5)
nRBC: 0 % (ref 0.0–0.2)

## 2024-03-07 LAB — C-REACTIVE PROTEIN: CRP: <0.5 mg/dL (ref <1.0)

## 2024-03-07 LAB — SEDIMENTATION RATE: Sed Rate: 11 mm/h (ref 0–20)

## 2024-03-07 NOTE — Patient Instructions (Signed)
 You are here for follow up of the rt foot infection . You had 2nd toe amputation- the bone margin had no infeciton- you are on doxy and augmentin  and will complete 2 weeks of it- you have a few more days left- today will do labs- no follow up with me needed- you are seeing the podiatrist after this visit

## 2024-03-07 NOTE — Progress Notes (Signed)
 NAME: Alexander Gill  DOB: 12/07/1955  MRN: [id number]  Date/Time: 03/07/2024 9:42 AM  Subjective:  Follow-up visit after recent hospitalization.  Here with his wife. Admitted 02/21/2024 until 02/25/2024. Alexander Gill is a 68 y.o. with a history of diabetes mellitus with neuropathy hypertension,Gram-negative bacteremia diabetic foot infection, left TMA, MSSA bacteremia in the past, mitral valve endocarditis and mitral valve regurgitation followed at [facility] was admitted to the hospital on 02/21/2024 because of right foot ulcer on the second toe and concern for infection.  He was ordered on an IV antibiotics in the hospital which was broad-spectrum initially.  He underwent second toe partial ray amputation.  He was discharged on Augmentin  and then doxycycline  was added for Staph capitis in the culture The pathology did not show any osteomyelitis of the resected bone as well as the proximal margin. Today he is going to see the podiatrist. 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   GERD (gastroesophageal reflux disease)    h/o   Hammertoe of right foot    Heart abnormality    Enlarged left ventricle   History of cellulitis    Right LE   Hyperlipidemia    Hypertension    Hypokalemia    Hypothyroidism    MSSA bacteremia    Nose colonized with MRSA 2023   Osteomyelitis of left foot (HCC)    Pyogenic inflammation of bone (HCC)    S/P transmetatarsal amputation of foot, left (HCC)    Toe osteomyelitis (HCC)    right foot    Past Surgical History:  Procedure Laterality Date   AMPUTATION Right 10/22/2021   Procedure: AMPUTATION RAY-PARTIAL 3RD RAY;  Surgeon: Lennie Barter, DPM;  Location: [facility];  Service: Podiatry;  Laterality: Right;   AMPUTATION Left 11/22/2022    Procedure: Transmetatarsal Amuptation Left Foot;  Surgeon: Neill Boas, DPM;  Location: [facility];  Service: Podiatry;  Laterality: Left;   AMPUTATION Right 02/22/2024   Procedure: AMPUTATION, FOOT, RAY;  Surgeon: Tanda [name], DPM;  Location: [facility];  Service: Podiatry;  Laterality: Right;   AMPUTATION TOE Right 07/05/2021   Procedure: AMPUTATION RIGHT 4TH TOE;  Surgeon: Neill Boas, DPM;  Location: [facility];  Service: Podiatry;  Laterality: Right;   HERNIA REPAIR     INCISION AND DRAINAGE OF WOUND Left 10/22/2021   Procedure: IRRIGATION AND DEBRIDEMENT WOUND;  Surgeon: Lennie Barter, DPM;  Location: [facility];  Service: Podiatry;  Laterality: Left;   TEE WITHOUT CARDIOVERSION N/A 11/26/2022   Procedure: TRANSESOPHAGEAL ECHOCARDIOGRAM;  Surgeon: Perla Evalene PARAS, MD;  Location: [facility];  Service: Cardiovascular;  Laterality: N/A;   TOE ARTHROPLASTY Left 10/22/2021   Procedure: TOE ARTHROPLASTY;  Surgeon: Lennie Barter, DPM;  Location: [facility];  Service: Podiatry;  Laterality: Left;    Social History   Socioeconomic History   Marital status: Married    Spouse name: Not on file   Number of children: Not on file   Years of education: Not on file   Highest education level: Not on file  Occupational History   Not on file  Tobacco Use   Smoking status: Never   Smokeless tobacco: Never  Vaping Use   Vaping status: Never Used  Substance and Sexual Activity   Alcohol use: Not Currently   Drug use: Not Currently   Sexual activity: Not Currently  Other Topics Concern   Not on file  Social History Narrative   Not on file   Social Drivers of Health   Financial Resource Strain: Low Risk  (06/28/2023)   Received from [facility]   Overall Financial Resource Strain (CARDIA)    Difficulty of Paying Living Expenses: Not very hard  Recent Concern: Financial Resource Strain - Medium Risk (06/14/2023)   Received from [facility]   Overall Financial Resource  Strain (CARDIA)    Difficulty of Paying Living Expenses: Somewhat hard  Food Insecurity: No Food Insecurity (02/21/2024)   Hunger Vital Sign    Worried About Running Out of Food in the Last Year: Never true    Ran Out of Food in the Last Year: Never true  Transportation Needs: No Transportation Needs (02/21/2024)   PRAPARE - Administrator, Civil Service (Medical): No    Lack of Transportation (Non-Medical): No  Physical Activity: Not on file  Stress: Not on file  Social Connections: Socially Integrated (02/21/2024)   Social Connection and Isolation Panel    Frequency of Communication with Friends and Family: More than three times a week    Frequency of Social Gatherings with Friends and Family: More than three times a week    Attends Religious Services: More than 4 times per year    Active Member of [employer] or Organizations: Yes    Attends Banker Meetings: 1 to 4 times per year    Marital Status: Married  Catering manager Violence: Not At Risk (02/21/2024)   Humiliation, Afraid, Rape, and Kick questionnaire    Fear of Current or Ex-Partner: No    Emotionally Abused: No    Physically Abused: No    Sexually Abused: No    Family History  Problem Relation Age of Onset   Diabetes Mother    Diabetes Father    Parkinson's disease Brother    No Known Allergies I? Current Outpatient Medications  Medication Sig Dispense Refill   amoxicillin -clavulanate (AUGMENTIN ) 875-125 MG tablet Take 1 tablet by mouth 2 (two) times daily for 14 days. 28 tablet 0   aspirin  EC 81 MG tablet Take 81 mg by mouth daily. Swallow whole.     cholecalciferol  (VITAMIN D3) 25 MCG (1000 UNIT) tablet Take 1,000 Units by mouth daily.     Dapagliflozin -metFORMIN  HCl ER (XIGDUO  XR) 11-998 MG TB24 Take 1 tablet by mouth in the morning and at bedtime.     doxycycline  (ADOXA) 100 MG tablet Take 1 tablet (100 mg total) by mouth 2 (two) times daily. 28 tablet 0   insulin  glargine (LANTUS ) 100 UNIT/ML  Solostar Pen Inject 38-40 Units into the skin every evening.     levothyroxine  (SYNTHROID ) 25 MCG tablet Take 25 mcg by mouth daily before breakfast.     losartan -hydrochlorothiazide  (HYZAAR) 100-25 MG tablet Take 1 tablet by mouth every morning.     rosuvastatin  (CRESTOR ) 20 MG tablet Take 20 mg by mouth every evening.     RYBELSUS 3 MG TABS Take 3 mg by mouth every evening.     Continuous Glucose Sensor (DEXCOM G7 SENSOR) MISC SMARTSIG:1 Every 10 Days     No current facility-administered medications for this visit.     Abtx:  Anti-infectives (From admission, onward)    None       REVIEW OF  SYSTEMS:  Const: negative fever, negative chills, negative weight loss Eyes: negative diplopia or visual changes, negative eye pain ENT: negative coryza, negative sore throat Resp: negative cough, hemoptysis, dyspnea Cards: negative for chest pain, palpitations, lower extremity edema GU: negative for frequency, dysuria and hematuria GI: Negative for abdominal pain, diarrhea, bleeding, constipation Skin: negative for rash and pruritus Heme: negative for easy bruising and gum/nose bleeding MS: As above Neurolo:negative for headaches, dizziness, vertigo, memory problems  Psych: negative for feelings of anxiety, depression  Endocrine:  diabetes Allergy/Immunology- negative for any medication or food allergies  Objective:  VITALS:  Ht 6' (1.829 m)   Wt 184 lb (83.5 kg)   BMI 24.95 kg/m   PHYSICAL EXAM:  General: Alert, cooperative, no distress, appears stated age.  Head: Normocephalic, without obvious abnormality, atraumatic. Eyes: Conjunctivae clear, anicteric sclerae. Pupils are equal ENT Nares normal. No drainage or sinus tenderness. Lips, mucosa, and tongue normal. No Thrush Neck: Supple, symmetrical, no adenopathy, thyroid: non tender no carotid bruit and no JVD. Back: No CVA tenderness. Lungs: Clear to auscultation bilaterally. No Wheezing or Rhonchi. No rales. Heart: S1-S2  systolic murmur Abdomen: Soft, non-tender,not distended. Bowel sounds normal. No masses Extremities: Right foot dressing removed The second toe and potation site has healed well the sutures are well-approximated.  No discharge or erythema On the plantar side there is Blood     Left foot TMA skin: No rashes or lesions. Or bruising Lymph: Cervical, supraclavicular normal. Neurologic: Grossly non-focal Pertinent Labs Lab Results CBC    Component Value Date/Time   WBC 9.4 02/22/2024 0310   RBC 4.85 02/22/2024 0310   HGB 14.3 02/22/2024 0310   HGB 15.1 10/01/2023 0815   HCT 42.5 02/22/2024 0310   HCT 45.9 10/01/2023 0815   PLT 235 02/22/2024 0310   PLT 231 10/01/2023 0815   MCV 87.6 02/22/2024 0310   MCV 90 10/01/2023 0815   MCH 29.5 02/22/2024 0310   MCHC 33.6 02/22/2024 0310   RDW 13.6 02/22/2024 0310   RDW 13.2 10/01/2023 0815   LYMPHSABS 1.6 02/21/2024 1116   MONOABS 0.8 02/21/2024 1116   EOSABS 0.5 02/21/2024 1116   BASOSABS 0.1 02/21/2024 1116       Latest Ref Rng & Units 02/23/2024   12:10 PM 02/22/2024    3:10 AM 02/21/2024   11:16 AM  CMP  Glucose 70 - 99 mg/dL  869  786   BUN 8 - 23 mg/dL  21  23   Creatinine 9.38 - 1.24 mg/dL 9.10  9.04  9.21   Sodium 135 - 145 mmol/L  140  140   Potassium 3.5 - 5.1 mmol/L  3.6  3.8   Chloride 98 - 111 mmol/L  106  105   CO2 22 - 32 mmol/L  27  23   Calcium  8.9 - 10.3 mg/dL  9.2  9.6   Total Protein 6.5 - 8.1 g/dL   7.7   Total Bilirubin 0.0 - 1.2 mg/dL   0.8   Alkaline Phos 38 - 126 U/L   77   AST 15 - 41 U/L   23   ALT 0 - 44 U/L   19      ? Impression/Recommendation Diabetic foot infection with neuropathy Status post second toe partial ray amputation The site is healed well He is on doxycycline  and Augmentin  to complete 2 weeks on 03/10/2024 There was Staph capitis in the culture There was no osteomyelitis on the bone pathology He is seeing podiatrist  today   Past history of MSSA bacteremia and mitral valve  endocarditis  Mitral regurgitation under observation at [facility]  Left foot TMA  I discussed the management with the patient and his wife No follow-up appointment is needed with me.   PS His ESR, CRP, CBC and CMP were all normal Note:  This document was prepared using Dragon voice recognition software and may include unintentional dictation errors.

## 2024-03-13 ENCOUNTER — Ambulatory Visit: Payer: Self-pay
